# Patient Record
Sex: Male | Born: 1945 | Race: White | Hispanic: No | Smoking: Current every day smoker
Health system: Southern US, Community
[De-identification: ages and names within clinical notes are randomized; demographics above are authoritative.]

## PROBLEM LIST (undated history)

## (undated) DIAGNOSIS — T7840XA Allergy, unspecified, initial encounter: Secondary | ICD-10-CM

## (undated) DIAGNOSIS — G4730 Sleep apnea, unspecified: Secondary | ICD-10-CM

## (undated) DIAGNOSIS — J449 Chronic obstructive pulmonary disease, unspecified: Secondary | ICD-10-CM

## (undated) DIAGNOSIS — L988 Other specified disorders of the skin and subcutaneous tissue: Secondary | ICD-10-CM

## (undated) DIAGNOSIS — I2510 Atherosclerotic heart disease of native coronary artery without angina pectoris: Secondary | ICD-10-CM

## (undated) DIAGNOSIS — G4733 Obstructive sleep apnea (adult) (pediatric): Secondary | ICD-10-CM

## (undated) DIAGNOSIS — R0789 Other chest pain: Secondary | ICD-10-CM

## (undated) DIAGNOSIS — F32A Depression, unspecified: Secondary | ICD-10-CM

## (undated) DIAGNOSIS — I10 Essential (primary) hypertension: Secondary | ICD-10-CM

## (undated) DIAGNOSIS — E119 Type 2 diabetes mellitus without complications: Secondary | ICD-10-CM

## (undated) DIAGNOSIS — E785 Hyperlipidemia, unspecified: Secondary | ICD-10-CM

## (undated) DIAGNOSIS — R809 Proteinuria, unspecified: Secondary | ICD-10-CM

## (undated) DIAGNOSIS — I700 Atherosclerosis of aorta: Secondary | ICD-10-CM

## (undated) DIAGNOSIS — D126 Benign neoplasm of colon, unspecified: Secondary | ICD-10-CM

## (undated) DIAGNOSIS — F329 Major depressive disorder, single episode, unspecified: Secondary | ICD-10-CM

## (undated) DIAGNOSIS — N400 Enlarged prostate without lower urinary tract symptoms: Secondary | ICD-10-CM

## (undated) HISTORY — DX: Major depressive disorder, single episode, unspecified: F32.9

## (undated) HISTORY — DX: Depression, unspecified: F32.A

## (undated) HISTORY — DX: Essential (primary) hypertension: I10

## (undated) HISTORY — PX: OTHER SURGICAL HISTORY: SHX169

## (undated) HISTORY — DX: Type 2 diabetes mellitus without complications: E11.9

## (undated) HISTORY — DX: Hyperlipidemia, unspecified: E78.5

## (undated) HISTORY — DX: Chronic obstructive pulmonary disease, unspecified: J44.9

## (undated) HISTORY — DX: Allergy, unspecified, initial encounter: T78.40XA

---
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
## 1986-05-06 DIAGNOSIS — C439 Malignant melanoma of skin, unspecified: Secondary | ICD-10-CM

## 1986-05-06 HISTORY — DX: Malignant melanoma of skin, unspecified: C43.9

## 1996-05-06 HISTORY — PX: MELANOMA EXCISION: SHX5266

## 1999-05-07 HISTORY — PX: CHOLECYSTECTOMY: SHX55

## 2005-05-24 ENCOUNTER — Ambulatory Visit: Payer: Self-pay | Admitting: Unknown Physician Specialty

## 2006-05-20 ENCOUNTER — Ambulatory Visit: Payer: Self-pay | Admitting: Unknown Physician Specialty

## 2007-12-23 DIAGNOSIS — D239 Other benign neoplasm of skin, unspecified: Secondary | ICD-10-CM

## 2007-12-23 HISTORY — DX: Other benign neoplasm of skin, unspecified: D23.9

## 2007-12-25 ENCOUNTER — Ambulatory Visit: Payer: Self-pay | Admitting: Dermatology

## 2012-03-31 ENCOUNTER — Observation Stay: Payer: Self-pay | Admitting: Specialist

## 2012-03-31 LAB — COMPREHENSIVE METABOLIC PANEL
Albumin: 3.4 g/dL (ref 3.4–5.0)
Bilirubin,Total: 0.6 mg/dL (ref 0.2–1.0)
Chloride: 110 mmol/L — ABNORMAL HIGH (ref 98–107)
Creatinine: 0.81 mg/dL (ref 0.60–1.30)
EGFR (African American): >60
Glucose: 109 mg/dL — ABNORMAL HIGH (ref 65–99)
Osmolality: 282 (ref 275–301)
Potassium: 4 mmol/L (ref 3.5–5.1)
SGOT(AST): 23 U/L (ref 15–37)
SGPT (ALT): 26 U/L (ref 12–78)
Sodium: 140 mmol/L (ref 136–145)
Total Protein: 5.9 g/dL — ABNORMAL LOW (ref 6.4–8.2)

## 2012-03-31 LAB — CBC
HGB: 14.2 g/dL (ref 13.0–18.0)
MCH: 33.3 pg (ref 26.0–34.0)
MCHC: 35.9 g/dL (ref 32.0–36.0)
Platelet: 205 10*3/uL (ref 150–440)
RDW: 13.1 % (ref 11.5–14.5)
WBC: 8 10*3/uL (ref 3.8–10.6)

## 2012-03-31 LAB — HEMOGLOBIN
HGB: 13.9 g/dL (ref 13.0–18.0)
HGB: 12.9 g/dL — ABNORMAL LOW (ref 13.0–18.0)

## 2012-03-31 LAB — PROTIME-INR: Prothrombin Time: 13.1 secs (ref 11.5–14.7)

## 2012-04-01 LAB — CBC WITH DIFFERENTIAL/PLATELET
Basophil #: 0.1 10*3/uL (ref 0.0–0.1)
Eosinophil %: 3.3 %
HCT: 36.1 % — ABNORMAL LOW (ref 40.0–52.0)
HGB: 12.5 g/dL — ABNORMAL LOW (ref 13.0–18.0)
Lymphocyte #: 2.3 10*3/uL (ref 1.0–3.6)
MCH: 32.6 pg (ref 26.0–34.0)
MCHC: 34.6 g/dL (ref 32.0–36.0)
MCV: 94 fL (ref 80–100)
Neutrophil #: 3.9 10*3/uL (ref 1.4–6.5)
Neutrophil %: 54.9 %
Platelet: 184 10*3/uL (ref 150–440)
RBC: 3.83 10*6/uL — ABNORMAL LOW (ref 4.40–5.90)
RDW: 13.3 % (ref 11.5–14.5)
WBC: 7.1 10*3/uL (ref 3.8–10.6)

## 2012-04-01 LAB — BASIC METABOLIC PANEL
Anion Gap: 5 — ABNORMAL LOW (ref 7–16)
BUN: 11 mg/dL (ref 7–18)
Chloride: 113 mmol/L — ABNORMAL HIGH (ref 98–107)
Co2: 24 mmol/L (ref 21–32)
Creatinine: 0.81 mg/dL (ref 0.60–1.30)
Osmolality: 283 (ref 275–301)
Potassium: 4.1 mmol/L (ref 3.5–5.1)
Sodium: 142 mmol/L (ref 136–145)

## 2012-09-25 ENCOUNTER — Ambulatory Visit: Payer: Self-pay | Admitting: Unknown Physician Specialty

## 2012-09-29 LAB — HM COLONOSCOPY

## 2012-09-30 LAB — PATHOLOGY REPORT

## 2014-08-23 NOTE — Consult Note (Signed)
PATIENT NAME:  David [name], David [name] MR#:  [id number] DATE OF BIRTH:  1945/08/22  DATE OF CONSULTATION:  03/31/2012  REFERRING PHYSICIAN:  Vivek J. Verdell Carmine, MD  CONSULTING PHYSICIAN:  Manya Silvas, MD PRIMARY CARE PHYSICIAN: Jerrell Belfast, MD   REASON FOR CONSULTATION:  The patient is a 69 year old white male who is admitted with lower GI bleeding, started last night about 10:00 p.m. He woke up in the morning and had three episodes. He came to the hospital and was admitted. I was asked to see him in consultation.   The patient's previous colonoscopy in 2007 showed multiple diverticula, and small polyps were removed. Internal hemorrhoids were also found.    REVIEW OF SYSTEMS: No fever. No nausea or vomiting. No crampy abdominal pain. No dysuria or hematuria. No asthma, wheezing, emphysema, chronic cough, shortness of breath. No exertional chest pains.   PAST MEDICAL HISTORY:  1. Benign prostatic hypertrophy.  2. Hyperlipidemia.  3. Hypertension.   ALLERGIES: No known drug allergies.   HABITS: Smokes about 3/4 a pack a day. No alcohol intake.   SOCIAL HISTORY: The patient works as a Dance movement psychotherapist for [employer]: His 25 year old mother is alive. She has had uterine cancer. Father died from a heart attack.   CURRENT MEDICATIONS:  1. Simvastatin 20 mg a day.  2. Fish oil 500 mg a day.  3. Flomax 0.4 mg a day.  4. Lisinopril 2.5 mg a day.  5. Meloxicam 7.5 mg daily.  PHYSICAL EXAMINATION:  GENERAL: White male in no acute distress.   VITAL SIGNS: Temperature 97.3, pulse 72, blood pressure 130/72, pulse oximetry 97%.   GENERAL: White male in no acute distress.   HEENT: Sclerae anicteric. Conjunctivae negative. Head is atraumatic.    CHEST: Clear.   HEART: No murmurs, gallops, clicks, or rubs.   ABDOMEN: Soft, nontender. No hepatosplenomegaly. No masses. No bruits.   LABORATORY, DIAGNOSTIC AND RADIOLOGICAL DATA: Glucose 109, BUN 18, creatinine 0.8, sodium  140, potassium 4, chloride 110, CO2 25, calcium 8.5, total protein 5.9, albumin 3.4, total bilirubin 0.6, alkaline phosphatase 64, SGOT 23, SGPT 26. Troponin less than 0.02. Hemoglobin 14.2, repeat this afternoon 13.9. White count 8000, platelet count 2500. A+ blood with negative antibody screen. Pro time is 13.0. A nuclear medicine study showed increasing accumulation of activity inferior to the bladder in the region of the genitalia which can be a normal appearance.  The activity increases throughout the study, possibility of an inguinal hernia extending into the scrotum or possibly urinary incontinence.   ASSESSMENT: Lower GI bleeding, atypical GI bleeding scan.   PLAN: Plan to follow along with serial hemoglobins and a clear liquid diet.   ____________________________ Manya Silvas, MD rte:cbb D: 03/31/2012 17:58:22 ET T: 03/31/2012 18:28:33 ET JOB#: [id number]  cc: Manya Silvas, MD, <Dictator> Jerrell Belfast, MD Manya Silvas MD ELECTRONICALLY SIGNED 04/18/2012 11:47

## 2014-08-23 NOTE — H&P (Signed)
PATIENT NAME:  David [name], David MR#:  [name] DATE OF BIRTH:  [birth year]  DATE OF ADMISSION:  03/31/2012  PRIMARY CARE PHYSICIAN: Dr. Margarita Rana  CHIEF COMPLAINT: Bright red blood per rectum.   HISTORY OF PRESENT ILLNESS: This is a 69 year old male who presents to the Emergency Room after 4 to 5 episodes of bright red blood per rectum. Patient says that he had his first episode at 10:00 last night prior to going to bed. Overnight he had none and then woke up this morning and had three episodes, two at work. They have increased to the amount of blood with more clots in the last two episodes. He has never had these symptoms before. He had a colonoscopy done in 2007 which showed internal hemorrhoids and diverticulosis and polyps which were benign. Patient presently denies any abdominal pain, any fever, chills, nausea, vomiting, chest pain, shortness of breath, or any other associated symptoms.   REVIEW OF SYSTEMS: CONSTITUTIONAL: No documented fever. No weight gain. No weight loss. EYES: No blurry or double vision. ENT: No tinnitus. No postnasal drip. No redness of the oropharynx. RESPIRATORY: No cough, no wheeze, no hemoptysis, no dyspnea. CARDIOVASCULAR: No chest pain, no orthopnea, no palpitations, no syncope. GASTROINTESTINAL: No nausea, no vomiting, no diarrhea, no abdominal pain. Positive hematochezia. No melena. GENITOURINARY: No dysuria, no hematuria. ENDOCRINE: No polyuria or nocturia. No heat or cold intolerance. HEME: No anemia, no bruising. Positive acute bleeding. INTEGUMENTARY: No rashes. No lesions. MUSCULOSKELETAL: No arthritis, no swelling, no gout. NEUROLOGIC: No numbness, no tingling, no ataxia, no seizure-type activity. PSYCH: No anxiety, no insomnia, no ADD.   PAST MEDICAL HISTORY:  1. Hypertension.  2. Hyperlipidemia.  3. Benign prostatic hypertrophy.  4. Arthritis.   ALLERGIES: No known drug allergies.   SOCIAL HISTORY: Does smoke about 3/4 pack per day, has been smoking  for the past 40+ years. No alcohol abuse. No illicit drug abuse. Lives at home with his wife.   FAMILY HISTORY: Mother is alive. She has uterine cancer. Father died from a ventricular fibrillation arrest.   CURRENT MEDICATIONS:  1. Simvastatin 20 mg daily.  2. Fish oil 500 mg daily.  3. Flomax 0.4 mg daily.  4. Lisinopril 2.5 mg daily. 5. Meloxicam 7.5 mg daily.   PHYSICAL EXAMINATION:  VITAL SIGNS: Patient's vital signs are noted to be: Temperature 98.1, pulse 62, respirations 20, blood pressure 110/62, 97% on room air.   GENERAL: He is a pleasant appearing male in no apparent distress.   HEENT: Atraumatic, normocephalic. Extraocular muscles are intact. Pupils equal, reactive to light. Sclerae anicteric. No conjunctival injection. No pharyngeal erythema.   NECK: Supple. There is no jugular venous distention, no bruits, no lymphadenopathy, no thyromegaly.   HEART: Regular rate and rhythm. No murmurs, no rubs, no clicks.   LUNGS: Clear to auscultation bilaterally. No rales, no rhonchi, no wheezes.   ABDOMEN: Soft, flat, nontender, nondistended. Has good bowel sounds. No hepatosplenomegaly appreciated.   EXTREMITIES: No evidence of any cyanosis, clubbing, or peripheral edema. Has +2 pedal and radial pulses bilaterally.   NEUROLOGICAL: Patient is alert, awake, oriented x3. No focal motor or sensory deficits appreciated bilaterally.   SKIN: Moist, warm with no rash appreciated.   LYMPHATIC: There is no cervical or axillary lymphadenopathy.   LABORATORY, DIAGNOSTIC AND RADIOLOGICAL DATA: Serum glucose 109, BUN 18, creatinine 0.8, sodium 140, potassium 4.0, chloride 110, bicarbonate 25. LFTs are within normal limits. Troponin less than 0.02. White cell count 8, hemoglobin 14.2, hematocrit 39.7, platelet  count 205.   ASSESSMENT AND PLAN: This is a 69 year old male with history of hypertension, osteoarthritis, hyperlipidemia, benign prostatic hypertrophy presents to the hospital with 4 to  5 episodes of bright red blood per rectum.  1. Acute lower GI bleed. Patient presently is hemodynamically stable. His hemoglobin is stable although he has had multiple episodes of hematochezia. I will follow serial hemoglobins q.6h. I will get a stat GI bleeding scan. Also get a GI consult. Discussed the case with Dr. Vira Agar who will see the patient. Place him on a clear liquid diet. Transfuse him as needed. Patient is not on any anticoagulants presently. This is likely a diverticular bleed as he had a colonoscopy in 2007 which showed diverticulosis with internal hemorrhoids.  2. Hypertension. I will hold his lisinopril for now given his GI bleed and follow his hemodynamics.  3. Hyperlipidemia. Continue simvastatin.  4. Benign prostatic hypertrophy: Continue Flomax. 5. Osteoarthritis. Hold his meloxicam given his GI bleed.  6. CODE STATUS: Patient is a FULL CODE.   TIME SPENT WITH ADMISSION: 45 minutes.   ____________________________ Belia Heman. Verdell Carmine, MD vjs:cms D: 03/31/2012 12:02:07 ET T: 03/31/2012 12:14:04 ET JOB#: [id number]  cc: Belia Heman. Verdell Carmine, MD, <Dictator> Jerrell Belfast, MD Henreitta Leber MD ELECTRONICALLY SIGNED 04/03/2012 14:58

## 2014-08-23 NOTE — Discharge Summary (Signed)
PATIENT NAME:  David [name], David [name] MR#:  [id number] DATE OF BIRTH:  03/25/1946  DATE OF ADMISSION:  03/31/2012 DATE OF DISCHARGE:  04/01/2012  For a detailed note, please take a look at the history and physical done on admission.   DIAGNOSES AT DISCHARGE:  1. Acute lower GI bleed likely secondary to diverticulosis. 2. Hypertension. 3. Benign prostatic hypertrophy. 4. Hyperlipidemia. 5. Arthritis.   DIET: The patient is being discharged on a clear liquid diet for one day to be advanced to a full liquid diet tomorrow. It is okay to eat mashed potatoes and Cream of Wheat. The patient can advance his diet to a low fiber diet two days from today and advised not to eat any nuts. The patient should also be on a low fat diet.   ACTIVITY: As tolerated.   FOLLOW-UP: Follow-up with Dr. Vira Agar in the next 3 to 4 weeks.   DISCHARGE MEDICATIONS:   1. Simvastatin 20 mg daily.  2. Lisinopril 2.5 mg daily.  3. Flomax 0.4 mg daily. 4. Fish Oil 500 mg daily.  5. Meloxicam 7.5 mg daily.   [place] COURSE: Dr. Gaylyn Cheers from Gastroenterology    [place]: This is a 69 year old male with medical problems as mentioned above who presented to the hospital with multiple episodes of bright red blood per rectum.  1. Acute lower GI bleed. This is likely the cause of the patient's multiple episodes of bright red blood per rectum. The patient underwent a bleeding scan in the hospital which did not show any acute foci of bleeding. The patient has had no further episodes of bright red blood per rectum since yesterday. His hemoglobin has remained stable. The patient was seen in consultation by GI by Dr. Vira Agar who thinks this is most likely a diverticular bleed. The patient likely will need a colonoscopy in the near future hopefully in the next 3 to 4 weeks. The patient for now will continue clear liquid diet and advance to full liquids within a day or so and eventually to a low fiber  diet as mentioned above.  2. Hypertension. The patient remained hemodynamically stable. He will resume his home meds as mentioned including lisinopril.  3. Hyperlipidemia. The patient was maintained on his simvastatin. He will resume that.  4. Benign prostatic hypertrophy. The patient was maintained on his Flomax. He will resume that.  5. Osteoarthritis. The patient is on meloxicam. He will resume that too.   CODE STATUS: The patient is a FULL CODE.   TIME SPENT: 40 minutes.   ____________________________ Belia Heman. Verdell Carmine, MD vjs:drc D: 04/01/2012 14:35:12 ET T: 04/02/2012 12:02:31 ET JOB#: [id number]  cc: Belia Heman. Verdell Carmine, MD, <Dictator> Manya Silvas, MD Henreitta Leber MD ELECTRONICALLY SIGNED 04/03/2012 14:58

## 2014-09-29 ENCOUNTER — Ambulatory Visit: Payer: Managed Care, Other (non HMO)

## 2014-10-18 ENCOUNTER — Ambulatory Visit: Payer: Managed Care, Other (non HMO)

## 2014-10-18 ENCOUNTER — Telehealth: Payer: Self-pay | Admitting: Family Medicine

## 2014-10-18 NOTE — Telephone Encounter (Signed)
Pt stated that the order for his CPAP machine that we faxed about 3 weeks ago needs to be fax to another company per pt's insurance. Pt request that we fax the order to: Fax# [facility] in [place]. Thanks TNP

## 2014-10-18 NOTE — Telephone Encounter (Signed)
Do you know how to do this. Thanks-  Dr. Venia Minks

## 2014-10-21 ENCOUNTER — Ambulatory Visit (INDEPENDENT_AMBULATORY_CARE_PROVIDER_SITE_OTHER): Payer: Medicare Other | Admitting: Family Medicine

## 2014-10-21 ENCOUNTER — Encounter: Payer: Self-pay | Admitting: Family Medicine

## 2014-10-21 VITALS — BP 106/60 | HR 72 | Temp 98.5°F | Resp 16 | Ht 67.0 in | Wt 227.0 lb

## 2014-10-21 DIAGNOSIS — E1129 Type 2 diabetes mellitus with other diabetic kidney complication: Secondary | ICD-10-CM | POA: Insufficient documentation

## 2014-10-21 DIAGNOSIS — G4733 Obstructive sleep apnea (adult) (pediatric): Secondary | ICD-10-CM | POA: Insufficient documentation

## 2014-10-21 DIAGNOSIS — F329 Major depressive disorder, single episode, unspecified: Secondary | ICD-10-CM | POA: Insufficient documentation

## 2014-10-21 DIAGNOSIS — F17200 Nicotine dependence, unspecified, uncomplicated: Secondary | ICD-10-CM | POA: Insufficient documentation

## 2014-10-21 DIAGNOSIS — R809 Proteinuria, unspecified: Secondary | ICD-10-CM | POA: Insufficient documentation

## 2014-10-21 DIAGNOSIS — M545 Low back pain, unspecified: Secondary | ICD-10-CM | POA: Insufficient documentation

## 2014-10-21 DIAGNOSIS — G4730 Sleep apnea, unspecified: Secondary | ICD-10-CM | POA: Insufficient documentation

## 2014-10-21 DIAGNOSIS — J449 Chronic obstructive pulmonary disease, unspecified: Secondary | ICD-10-CM | POA: Insufficient documentation

## 2014-10-21 DIAGNOSIS — R202 Paresthesia of skin: Secondary | ICD-10-CM | POA: Insufficient documentation

## 2014-10-21 DIAGNOSIS — F32A Depression, unspecified: Secondary | ICD-10-CM | POA: Insufficient documentation

## 2014-10-21 DIAGNOSIS — E1169 Type 2 diabetes mellitus with other specified complication: Secondary | ICD-10-CM | POA: Insufficient documentation

## 2014-10-21 DIAGNOSIS — C439 Malignant melanoma of skin, unspecified: Secondary | ICD-10-CM | POA: Insufficient documentation

## 2014-10-21 DIAGNOSIS — E7800 Pure hypercholesterolemia, unspecified: Secondary | ICD-10-CM | POA: Insufficient documentation

## 2014-10-21 DIAGNOSIS — J309 Allergic rhinitis, unspecified: Secondary | ICD-10-CM | POA: Insufficient documentation

## 2014-10-21 DIAGNOSIS — N400 Enlarged prostate without lower urinary tract symptoms: Secondary | ICD-10-CM | POA: Insufficient documentation

## 2014-10-21 DIAGNOSIS — E669 Obesity, unspecified: Secondary | ICD-10-CM

## 2014-10-21 DIAGNOSIS — N529 Male erectile dysfunction, unspecified: Secondary | ICD-10-CM | POA: Insufficient documentation

## 2014-10-21 DIAGNOSIS — IMO0002 Reserved for concepts with insufficient information to code with codable children: Secondary | ICD-10-CM | POA: Insufficient documentation

## 2014-10-21 NOTE — Telephone Encounter (Signed)
Pt advised he would need office visit to complete sleep study

## 2014-10-21 NOTE — Progress Notes (Signed)
Subjective:    Patient ID: David Mutter., male    DOB: May 23, 1945, 69 y.o.   MRN: [id number]  HPI Sleep Apnea: Patient presents with known obstructive sleep apnea. Patent has history of symptoms of morning fatigue, falling asleep and snoring. . Patient generally gets 6 hours of sleep per night currently on his machine  and states they generally have generally restful sleep. Machine is over 66 years old.  Snoring of any severity is not present when he uses his CPAP machine. Marland Kitchen Apneic episodes are not present. Nasal obstruction is not present.  Patient has had tonsillectomy. Patient reports no snoring, or apnea with CPAP machine. Patient reports that he is trying to get a new machine. Patient reports that the company is requesting a new sleep study. Has been on CPAP machine with good success since 1998. Was falling asleep, daytime fatigue, loud snoring. All of this is controlled with CPAP. Needs an new machine.   [facility] went out of business.  Needs a new, recent sleep study in order to obtain new machine. Patient has been very compliant with his CPAP over the years as he feels so much better.      Review of Systems  Constitutional: Negative.   HENT: Positive for postnasal drip.   Eyes: Negative.   Respiratory: Positive for cough.   Cardiovascular: Negative.   Psychiatric/Behavioral: Negative.    Patient Active Problem List   Diagnosis Date Noted  . 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 Clinical depression 10/21/2014  . Diabetes mellitus type 2 in obese 10/21/2014  . 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 Hypercholesteremia 10/21/2014  . LBP (low back pain) 10/21/2014  . 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 Microalbuminuria 10/21/2014  . Adult BMI 30+ 10/21/2014  . Burning or prickling sensation 10/21/2014  . Allergic rhinitis 10/21/2014  . Apnea, sleep 10/21/2014  . 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 Allergy    No current outpatient prescriptions on file prior to visit.   No current facility-administered medications on file prior to visit.   Allergies  Allergen Reactions  . Alcohol     Addiction   Past Surgical History  Procedure Laterality Date  . Cholecystectomy  2001  . Melanoma excision  1998  . Tonsillectomy and adenoidectomy  1953   History   Social History  . Marital Status: Married    Spouse Name: N/A  . Number of Children: N/A  . Years of Education: College   Occupational History  .  [employer]    Full-Time   Social History Main Topics  . Smoking status: Former Smoker -- 0.25 packs/day for 52 years    Types: Cigarettes    Quit date: 11/03/2013  . Smokeless tobacco: Never Used  . Alcohol Use: No     Comment: Previous Heavy Alcohol Abuse  . Drug Use: No  . Sexual Activity: Not on file   Other Topics Concern  . Not on file   Social History Narrative   Family History  Problem Relation Age of Onset  . Cervical cancer Mother   . Colon cancer Mother   . Colon polyps Brother   . Cervical cancer Maternal Aunt   . Cervical cancer Maternal Grandmother   . Diabetes Maternal Grandmother   . Tuberculosis Maternal Grandmother   . Lung cancer Maternal Grandfather        Objective:   Physical Exam  Constitutional: He is oriented to person, place, and time. He appears well-developed and well-nourished.  Cardiovascular: Normal rate and regular rhythm.   Pulmonary/Chest: Effort normal and breath sounds normal.  Neurological: He is alert and oriented to person, place, and time.  Psychiatric: He has a normal mood and affect.    Blood pressure 106/60, pulse 72, temperature 98.5 F (36.9 C), temperature source Oral, resp. rate 16, height 5\' 7"  (1.702 m), weight 227 lb (102.967 kg), SpO2 96 %.       Assessment & Plan:   1. Apnea, sleep Patient has sleep apnea.  Needs new sleep study in order get a new machine. His machine is over 32  years old. Sleep study from 18 byears ago is not available. Will order new study as patient really needs new machine to treat his sleep apnea for his physical and mental health.   - Ambulatory referral to Sleep Studies

## 2014-11-08 ENCOUNTER — Telehealth: Payer: Self-pay | Admitting: Family Medicine

## 2014-11-08 NOTE — Telephone Encounter (Signed)
David [name] from [facility] states that information that was faxed in did not meet criteria for sleep study to be met.She will send it to medical director for review.Phone [phone]

## 2014-11-08 NOTE — Telephone Encounter (Signed)
Do I need to call them?  Thanks.

## 2014-11-09 NOTE — Telephone Encounter (Signed)
No,If it is denied by medical director then you will get fax or another phone call letting you know this.Message was just an [place]

## 2014-11-10 ENCOUNTER — Encounter: Payer: Self-pay | Admitting: Family Medicine

## 2014-11-11 ENCOUNTER — Encounter: Payer: Self-pay | Admitting: Family Medicine

## 2014-11-11 NOTE — Telephone Encounter (Signed)
[employer] call regarding a sleep study for Mr. Buch.  The call back is [phone].  Thanks, TP

## 2014-11-14 NOTE — Telephone Encounter (Signed)
Referral form for home sleep study sent back to Dr Venia Minks to sign

## 2014-11-14 NOTE — Telephone Encounter (Signed)
Need form for Home study. Thanks.

## 2014-12-12 ENCOUNTER — Telehealth: Payer: Self-pay | Admitting: Family Medicine

## 2014-12-12 DIAGNOSIS — G4730 Sleep apnea, unspecified: Secondary | ICD-10-CM

## 2014-12-12 NOTE — Telephone Encounter (Signed)
Information faxed to [facility] pt (Phone [phone]) at request of pt for Auto CPAP.Their office will contact pt

## 2014-12-12 NOTE — Telephone Encounter (Signed)
Please schedule auto-titration secondary to sleep apnea on sleep study.  Thanks.

## 2014-12-16 ENCOUNTER — Encounter: Payer: Self-pay | Admitting: Family Medicine

## 2015-01-18 ENCOUNTER — Encounter: Payer: Self-pay | Admitting: Family Medicine

## 2015-01-18 ENCOUNTER — Ambulatory Visit (INDEPENDENT_AMBULATORY_CARE_PROVIDER_SITE_OTHER): Payer: Managed Care, Other (non HMO) | Admitting: Family Medicine

## 2015-01-18 VITALS — BP 104/62 | HR 68 | Temp 98.2°F | Resp 16 | Ht 67.0 in | Wt 219.0 lb

## 2015-01-18 DIAGNOSIS — E669 Obesity, unspecified: Secondary | ICD-10-CM

## 2015-01-18 DIAGNOSIS — J449 Chronic obstructive pulmonary disease, unspecified: Secondary | ICD-10-CM | POA: Diagnosis not present

## 2015-01-18 DIAGNOSIS — E7800 Pure hypercholesterolemia, unspecified: Secondary | ICD-10-CM

## 2015-01-18 DIAGNOSIS — E1169 Type 2 diabetes mellitus with other specified complication: Secondary | ICD-10-CM

## 2015-01-18 DIAGNOSIS — E119 Type 2 diabetes mellitus without complications: Secondary | ICD-10-CM | POA: Diagnosis not present

## 2015-01-18 DIAGNOSIS — G4730 Sleep apnea, unspecified: Secondary | ICD-10-CM

## 2015-01-18 DIAGNOSIS — F17200 Nicotine dependence, unspecified, uncomplicated: Secondary | ICD-10-CM | POA: Diagnosis not present

## 2015-01-18 DIAGNOSIS — IMO0002 Reserved for concepts with insufficient information to code with codable children: Secondary | ICD-10-CM

## 2015-01-18 DIAGNOSIS — E668 Other obesity: Secondary | ICD-10-CM

## 2015-01-18 LAB — POCT GLYCOSYLATED HEMOGLOBIN (HGB A1C)
Est. average glucose Bld gHb Est-mCnc: 131
Hemoglobin A1C: 6.2

## 2015-01-18 NOTE — Progress Notes (Signed)
Subjective:    Patient ID: David Mutter., male    DOB: [birth year], 69 y.o.   MRN: [id number]  Diabetes He presents for his follow-up (Last A1C  09/09/2014 7.6%) diabetic visit. There are no hypoglycemic associated symptoms. Pertinent negatives for diabetes include no blurred vision, no chest pain, no fatigue, no foot paresthesias, no foot ulcerations, no polydipsia, no polyphagia, no polyuria, no visual change, no weakness and no weight loss. Current diabetic treatment includes oral agent (monotherapy). He is compliant with treatment all of the time. (BS not being checked) Eye exam is not current (1.5 years. [facility]).  Hyperlipidemia This is a chronic problem. Lipid results: 11/03/2013 Total- 179, Trig- 182, HDL- 34, LDL- 109. Pertinent negatives include no chest pain, focal sensory loss, focal weakness, leg pain, myalgias or shortness of breath. Current antihyperlipidemic treatment includes statins (Atorvastatin 20 mg).   Also, does have sleep apnea.  Using machine. CPAP machine is really old. Does need a new one. Still waiting on CPAP titration.     Review of Systems  Constitutional: Negative for fever, chills, weight loss, diaphoresis, activity change, appetite change, fatigue and unexpected weight change.  Eyes: Negative for blurred vision.  Respiratory: Negative for shortness of breath.   Cardiovascular: Negative for chest pain.  Endocrine: Negative for polydipsia, polyphagia and polyuria.  Musculoskeletal: Negative for myalgias.  Neurological: Negative for focal weakness and weakness.   BP 104/62 mmHg  Pulse 68  Temp(Src) 98.2 F (36.8 C) (Oral)  Resp 16  Ht 5\' 7"  (1.702 m)  Wt 219 lb (99.338 kg)  BMI 34.29 kg/m2   Patient Active Problem List   Diagnosis Date Noted  . 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 Clinical depression 10/21/2014  . Diabetes mellitus type 2 in obese 10/21/2014  . 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  Hypercholesteremia 10/21/2014  . LBP (low back pain) 10/21/2014  . 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 Microalbuminuria 10/21/2014  . Adult BMI 30+ 10/21/2014  . Burning or prickling sensation 10/21/2014  . Allergic rhinitis 10/21/2014  . Apnea, sleep 10/21/2014  . 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 Allergy   . COPD (chronic obstructive pulmonary disease)   . Diabetes mellitus without complication   . Hyperlipidemia   . Depression    Current Outpatient Prescriptions on File Prior to Visit  Medication Sig  . atorvastatin (LIPITOR) 20 MG tablet Take by mouth.  . Cholecalciferol (VITAMIN D) 2000 UNITS CAPS Take by mouth.  Marland Kitchen lisinopril (PRINIVIL,ZESTRIL) 5 MG tablet Take by mouth.  . metFORMIN (GLUCOPHAGE) 500 MG tablet Take by mouth.  . Omega 3 1000 MG CAPS Take by mouth.  . sildenafil (VIAGRA) 50 MG tablet Take by mouth.  . tamsulosin (FLOMAX) 0.4 MG CAPS capsule Take by mouth.  . diphenhydrAMINE (BENADRYL) 25 mg capsule Take 25 mg by mouth every 6 (six) hours as needed.  . meloxicam (MOBIC) 15 MG tablet Take by mouth.   No current facility-administered medications on file prior to visit.   Allergies  Allergen Reactions  . Alcohol     Addiction   Past Surgical History  Procedure Laterality Date  . Cholecystectomy  2001  . Melanoma excision  1998  . Tonsillectomy and adenoidectomy  1953   Social History   Social History  . Marital Status: Married    Spouse Name: N/A  . Number of Children: N/A  . Years of Education:  College   Occupational History  .  [employer]    Full-Time   Social History Main Topics  . Smoking status: Current Every Day Smoker -- 1.00 packs/day for 52 years    Types: Cigarettes    Last Attempt to Quit: 11/03/2013  . Smokeless tobacco: Never Used     Comment: Pt started smoking again  . Alcohol Use: No     Comment: Previous Heavy Alcohol Abuse  . Drug Use: No  . Sexual Activity: Not on  file   Other Topics Concern  . Not on file   Social History Narrative   Family History  Problem Relation Age of Onset  . Cervical cancer Mother   . Colon cancer Mother   . Colon polyps Brother   . Cervical cancer Maternal Aunt   . Cervical cancer Maternal Grandmother   . Diabetes Maternal Grandmother   . Tuberculosis Maternal Grandmother   . Lung cancer Maternal Grandfather        Objective:   Physical Exam  Constitutional: He is oriented to person, place, and time. He appears well-developed and well-nourished.  Cardiovascular: Normal rate and regular rhythm.   Pulmonary/Chest: Effort normal and breath sounds normal.  Neurological: He is alert and oriented to person, place, and time.   BP 104/62 mmHg  Pulse 68  Temp(Src) 98.2 F (36.8 C) (Oral)  Resp 16  Ht 5\' 7"  (1.702 m)  Wt 219 lb (99.338 kg)  BMI 34.29 kg/m2     Assessment & Plan:  1. Diabetes mellitus type 2 in obese Stable.  Improved. Will continue current medication,  And recheck in 3 months.  - POCT glycosylated hemoglobin (Hb A1C) - CBC with Differential/Platelet - Comprehensive metabolic panel Results for orders placed or performed in visit on 01/18/15  POCT glycosylated hemoglobin (Hb A1C)  Result Value Ref Range   Hemoglobin A1C 6.2    Est. average glucose Bld gHb Est-mCnc 131     2. Adult BMI 30+ Has lost 11 pounds.   3. Hypercholesteremia Needs labs checked.  - Lipid panel  4. Compulsive tobacco user syndrome Spirometry is normal today.  Still recommend work on tobacco cessation.   - Spirometry with Graph  5. Apnea, sleep Awaiting CPAP titration.  - TSH  6. COPD, mild As above.  - Spirometry with Graph  Margarita Rana, MD

## 2015-01-20 LAB — CBC WITH DIFFERENTIAL/PLATELET
Basophils Absolute: 0 10*3/uL (ref 0.0–0.2)
Basos: 0 %
EOS (ABSOLUTE): 0.3 10*3/uL (ref 0.0–0.4)
EOS: 3 %
HEMATOCRIT: 41.7 % (ref 37.5–51.0)
Hemoglobin: 14.1 g/dL (ref 12.6–17.7)
IMMATURE GRANULOCYTES: 1 %
Immature Grans (Abs): 0.1 10*3/uL (ref 0.0–0.1)
LYMPHS: 30 %
Lymphocytes Absolute: 2.5 10*3/uL (ref 0.7–3.1)
MCH: 30.3 pg (ref 26.6–33.0)
MCHC: 33.8 g/dL (ref 31.5–35.7)
MCV: 90 fL (ref 79–97)
MONOCYTES: 11 %
Monocytes Absolute: 0.9 10*3/uL (ref 0.1–0.9)
NEUTROS PCT: 55 %
Neutrophils Absolute: 4.6 10*3/uL (ref 1.4–7.0)
Platelets: 245 10*3/uL (ref 150–379)
RBC: 4.65 x10E6/uL (ref 4.14–5.80)
RDW: 13.9 % (ref 12.3–15.4)
WBC: 8.3 10*3/uL (ref 3.4–10.8)

## 2015-01-20 LAB — COMPREHENSIVE METABOLIC PANEL
A/G RATIO: 2 (ref 1.1–2.5)
ALBUMIN: 4.3 g/dL (ref 3.6–4.8)
ALK PHOS: 96 IU/L (ref 39–117)
ALT: 23 IU/L (ref 0–44)
AST: 16 IU/L (ref 0–40)
BUN / CREAT RATIO: 12 (ref 10–22)
BUN: 13 mg/dL (ref 8–27)
Bilirubin Total: 0.4 mg/dL (ref 0.0–1.2)
CHLORIDE: 103 mmol/L (ref 97–108)
CO2: 20 mmol/L (ref 18–29)
Calcium: 9.2 mg/dL (ref 8.6–10.2)
Creatinine, Ser: 1.06 mg/dL (ref 0.76–1.27)
GFR calc Af Amer: 82 mL/min/{1.73_m2} (ref >59)
GFR calc non Af Amer: 71 mL/min/{1.73_m2} (ref >59)
GLUCOSE: 105 mg/dL — AB (ref 65–99)
Globulin, Total: 2.2 g/dL (ref 1.5–4.5)
POTASSIUM: 4.9 mmol/L (ref 3.5–5.2)
Sodium: 143 mmol/L (ref 134–144)
Total Protein: 6.5 g/dL (ref 6.0–8.5)

## 2015-01-20 LAB — LIPID PANEL
Chol/HDL Ratio: 4.7 ratio units (ref 0.0–5.0)
Cholesterol, Total: 151 mg/dL (ref 100–199)
HDL: 32 mg/dL — ABNORMAL LOW (ref >39)
LDL Calculated: 66 mg/dL (ref 0–99)
Triglycerides: 265 mg/dL — ABNORMAL HIGH (ref 0–149)
VLDL CHOLESTEROL CAL: 53 mg/dL — AB (ref 5–40)

## 2015-01-20 LAB — TSH: TSH: 3.94 u[IU]/mL (ref 0.450–4.500)

## 2015-05-24 ENCOUNTER — Ambulatory Visit (INDEPENDENT_AMBULATORY_CARE_PROVIDER_SITE_OTHER): Payer: Medicare Other | Admitting: Family Medicine

## 2015-05-24 ENCOUNTER — Encounter: Payer: Self-pay | Admitting: Family Medicine

## 2015-05-24 DIAGNOSIS — G4730 Sleep apnea, unspecified: Secondary | ICD-10-CM

## 2015-05-24 DIAGNOSIS — Z0000 Encounter for general adult medical examination without abnormal findings: Secondary | ICD-10-CM

## 2015-05-24 NOTE — Progress Notes (Signed)
Patient ID: David [name]., male   DOB: 26-Jun-1945, 70 y.o.   MRN: [id number]       Patient: David [name]., Male    DOB: March 26, 1946, 70 y.o.   MRN: [id number] Visit Date: 05/24/2015  Today's Provider: Margarita Rana, MD   Chief Complaint  Patient presents with  . Medicare Wellness   Subjective:    Annual wellness visit David Gennusa. is a 70 y.o. male. He feels well. He reports exercising active with daily activites. He reports he is sleeping well. 12/20/10 CPE 09/25/12 Colon-polyps, int hemorrhoids, diverticulosis, recheck in 5 yrs  Lab Results  Component Value Date   WBC 8.3 01/19/2015   HGB 12.5* 04/01/2012   HCT 41.7 01/19/2015   PLT 245 01/19/2015   GLUCOSE 105* 01/19/2015   CHOL 151 01/19/2015   TRIG 265* 01/19/2015   HDL 32* 01/19/2015   LDLCALC 66 01/19/2015   ALT 23 01/19/2015   AST 16 01/19/2015   NA 143 01/19/2015   K 4.9 01/19/2015   CL 103 01/19/2015   CREATININE 1.06 01/19/2015   BUN 13 01/19/2015   CO2 20 01/19/2015   TSH 3.940 01/19/2015   INR 1.0 03/31/2012   HGBA1C 6.2 01/18/2015   -----------------------------------------------------------   Review of Systems  Constitutional: Negative.   HENT: Negative.   Eyes: Negative.   Respiratory: Positive for apnea.   Cardiovascular: Negative.   Gastrointestinal: Negative.   Endocrine: Negative.   Genitourinary: Negative.   Musculoskeletal: Negative.   Skin: Negative.   Allergic/Immunologic: Negative.   Neurological: Negative.   Hematological: Negative.   Psychiatric/Behavioral: Negative.     Social History   Social History  . Marital Status: Married    Spouse Name: N/A  . Number of Children: N/A  . Years of Education: College   Occupational History  .  [employer]    retired   Social History Main Topics  . Smoking status: Current Every Day Smoker -- 1.00 packs/day for 52 years    Types: Cigarettes    Last Attempt to Quit: 11/03/2013  . Smokeless tobacco: Never Used   Comment: Pt started smoking again  . Alcohol Use: No     Comment: Previous Heavy Alcohol Abuse  . Drug Use: No  . Sexual Activity: Not on file   Other Topics Concern  . Not on file   Social History Narrative    Past Medical History  Diagnosis Date  . Allergy   . COPD (chronic obstructive pulmonary disease) ([place])   . Diabetes mellitus without complication ([place])   . Hyperlipidemia   . Depression      Patient Active Problem List   Diagnosis Date Noted  . 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 COPD, mild ([place]) 10/21/2014  . Clinical depression 10/21/2014  . Diabetes mellitus type 2 in obese ([place]) 10/21/2014  . 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 Hypercholesteremia 10/21/2014  . LBP (low back pain) 10/21/2014  . 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 Microalbuminuria 10/21/2014  . Adult BMI 30+ 10/21/2014  . Burning or prickling sensation 10/21/2014  . Allergic rhinitis 10/21/2014  . Apnea, sleep 10/21/2014  . Compulsive tobacco user syndrome 10/21/2014    Past Surgical History  Procedure Laterality Date  . Cholecystectomy  2001  . Melanoma excision  1998  . Tonsillectomy and adenoidectomy  1953    His family history includes Cervical cancer in his maternal aunt, maternal grandmother, and mother; Colon cancer in his mother; Colon polyps in  his brother; Diabetes in his maternal grandmother; Lung cancer in his maternal grandfather; Tuberculosis in his maternal grandmother.    Previous Medications   ATORVASTATIN (LIPITOR) 20 MG TABLET    Take 20 mg by mouth daily at 6 PM.    CHOLECALCIFEROL (VITAMIN D) 2000 UNITS CAPS    Take 1 capsule by mouth daily.    DIPHENHYDRAMINE (BENADRYL) 25 MG CAPSULE    Take 25 mg by mouth every 6 (six) hours as needed.   LISINOPRIL (PRINIVIL,ZESTRIL) 5 MG TABLET    Take 5 mg by mouth daily.    LORATADINE (CLARITIN) 10 MG TABLET    Take 10 mg by mouth daily as needed.    MELOXICAM (MOBIC) 15 MG TABLET    Take 15 mg by mouth as  needed.    METFORMIN (GLUCOPHAGE) 500 MG TABLET    Take 500 mg by mouth daily with breakfast.    OMEGA 3 1000 MG CAPS    Take 1 capsule by mouth daily.    SILDENAFIL (VIAGRA) 50 MG TABLET    Take 50 mg by mouth as needed.    TAMSULOSIN (FLOMAX) 0.4 MG CAPS CAPSULE    Take 0.4 mg by mouth daily.     Patient Care Team: Margarita Rana, MD as PCP - General (Family Medicine)     Objective:   Vitals: BP 108/66 mmHg  Pulse 65  Temp(Src) 98.3 F (36.8 C) (Oral)  Resp 16  Ht 5\' 7"  (1.702 m)  Wt 222 lb (100.699 kg)  BMI 34.76 kg/m2  SpO2 98%  Physical Exam  Constitutional: He is oriented to person, place, and time. He appears well-developed and well-nourished.  HENT:  Head: Normocephalic and atraumatic.  Right Ear: External ear normal.  Left Ear: External ear normal.  Nose: Nose normal.  Mouth/Throat: Oropharynx is clear and moist.  Eyes: Conjunctivae and EOM are normal. Pupils are equal, round, and reactive to light.  Neck: Normal range of motion. Neck supple.  Cardiovascular: Normal rate, regular rhythm and normal heart sounds.   Pulmonary/Chest: Effort normal and breath sounds normal.  Abdominal: Soft. Bowel sounds are normal.  Musculoskeletal: Normal range of motion.  Neurological: He is alert and oriented to person, place, and time.  Skin: Skin is warm and dry.  Psychiatric: He has a normal mood and affect. His behavior is normal. Judgment and thought content normal.    Activities of Daily Living In your present state of health, do you have any difficulty performing the following activities: 05/24/2015 10/21/2014  Hearing? N N  Vision? N N  Difficulty concentrating or making decisions? N N  Walking or climbing stairs? N N  Dressing or bathing? N N  Doing errands, shopping? N N    Fall Risk Assessment Fall Risk  05/24/2015 10/21/2014  Falls in the past year? No No     Depression Screen PHQ 2/9 Scores 05/24/2015 10/21/2014  PHQ - 2 Score 0 0    Cognitive Testing -  6-CIT  Correct? Score   What year is it? yes 0 0 or 4  What month is it? yes 0 0 or 3  Memorize:    Pia Mau,  41,  [place],      What time is it? (within 1 hour) yes 0 0 or 3  Count backwards from 20 yes 0 0, 2, or 4  Name the months of the year yes 0 0, 2, or 4  Repeat name & address above yes 1 0,  2, 4, 6, 8, or 10       TOTAL SCORE  1/28   Interpretation:  Normal  Normal (0-7) Abnormal (8-28)       Assessment & Plan:     Annual Wellness Visit  Reviewed patient's Family Medical History Reviewed and updated list of patient's medical providers Assessment of cognitive impairment was done Assessed patient's functional ability Established a written schedule for health screening Bernice Completed and Reviewed  Exercise Activities and Dietary recommendations Goals    None      Immunization History  Administered Date(s) Administered  . Influenza-Unspecified 03/07/2015  . Pneumococcal Conjugate-13 06/08/2014  . Pneumococcal Polysaccharide-23 12/16/2011  . Td 09/11/2004  . Zoster 02/27/2012      1. Initial Medicare annual wellness visit Stable. Patient advised to continue eating healthy and exercise daily. Patient retired from [employer] 2 weeks ago. Patient advised to follow-up in 2 months to F/U in diabetes. - EKG 12-Lead   2. Apnea, sleep Wrote rx for auto-titrating CPAP and gave copy of study.      Patient seen and examined by Dr. Jerrell Belfast, and note scribed by Philbert Riser. Dimas, CMA.  I have reviewed the document for accuracy and completeness and I agree with above. Jerrell Belfast, MD   Margarita Rana, MD   ------------------------------------------------------------------------------------------------------------

## 2015-06-08 ENCOUNTER — Encounter: Payer: Self-pay | Admitting: Family Medicine

## 2015-06-08 NOTE — Telephone Encounter (Signed)
Please check on this. See message below.  Did we send the documentation needed. Thanks.

## 2015-07-06 ENCOUNTER — Ambulatory Visit (INDEPENDENT_AMBULATORY_CARE_PROVIDER_SITE_OTHER): Payer: Medicare Other | Admitting: Family Medicine

## 2015-07-06 ENCOUNTER — Encounter: Payer: Self-pay | Admitting: Family Medicine

## 2015-07-06 VITALS — BP 138/64 | HR 68 | Temp 98.0°F | Resp 16 | Wt 231.0 lb

## 2015-07-06 DIAGNOSIS — G4730 Sleep apnea, unspecified: Secondary | ICD-10-CM | POA: Diagnosis not present

## 2015-07-06 NOTE — Progress Notes (Signed)
Patient ID: David [name]., male   DOB: May 24, 1945, 70 y.o.   MRN: [id number]        Patient: David [name]. Male    DOB: 12-25-1945   70 y.o.   MRN: [id number] Visit Date: 07/06/2015  Today's Provider: Margarita Rana, MD   Chief Complaint  Patient presents with  . Sleep Apnea   Subjective:    HPI   Sleep Apnea: He presents for a sleep evaluation. He reports is Cpap is over 49 years old and is need of a new one.  He is compliant with using it every night and reports good symptom control.  Symptoms began several years ago, controlled since that time.   He goes to sleep at 11:30 weekdays and 11:30 weekends. He awakens 0800 weekdays and 0800 weekends. Puts on mask when first gets in bed and does not take if off until am, approximately 8 hours a night.  Is 100 percent compliant in that he can not sleep without it. Takes machine when travels, even camping. He falls asleep in 10 minutes.  Feels rested in the am. Good symptom control. Usually wakes before his alarm. Feeling good since retirement.     BP has up a little recently.  Feels well. Taking medication. Will continue to monitor. No chest pain or SOB. Increase activity since retirement.      Allergies  Allergen Reactions  . Alcohol     Addiction   Previous Medications   ATORVASTATIN (LIPITOR) 20 MG TABLET    Take 20 mg by mouth daily at 6 PM.    CHOLECALCIFEROL (VITAMIN D) 2000 UNITS CAPS    Take 1 capsule by mouth daily.    DIPHENHYDRAMINE (BENADRYL) 25 MG CAPSULE    Take 25 mg by mouth every 6 (six) hours as needed.   LISINOPRIL (PRINIVIL,ZESTRIL) 5 MG TABLET    Take 5 mg by mouth daily.    LORATADINE (CLARITIN) 10 MG TABLET    Take 10 mg by mouth daily as needed.    MELOXICAM (MOBIC) 15 MG TABLET    Take 15 mg by mouth as needed.    METFORMIN (GLUCOPHAGE) 500 MG TABLET    Take 500 mg by mouth daily with breakfast.    OMEGA 3 1000 MG CAPS    Take 1 capsule by mouth daily.    SILDENAFIL (VIAGRA) 50 MG TABLET    Take 50 mg by  mouth as needed.    TAMSULOSIN (FLOMAX) 0.4 MG CAPS CAPSULE    Take 0.4 mg by mouth daily.     Review of Systems  Constitutional: Negative for fever, chills, diaphoresis, activity change, appetite change, fatigue and unexpected weight change.  Respiratory: Positive for apnea. Negative for cough, shortness of breath and wheezing.   Cardiovascular: Negative for chest pain, palpitations and leg swelling.    Social History  Substance Use Topics  . Smoking status: Current Every Day Smoker -- 1.00 packs/day for 52 years    Types: Cigarettes    Last Attempt to Quit: 11/03/2013  . Smokeless tobacco: Never Used     Comment: Pt started smoking again  . Alcohol Use: No     Comment: Previous Heavy Alcohol Abuse   Objective:   BP 138/64 mmHg  Pulse 68  Temp(Src) 98 F (36.7 C) (Oral)  Resp 16  Wt 231 lb (104.781 kg)  SpO2 98%  Physical Exam  Constitutional: He is oriented to person, place, and time. He appears well-developed and well-nourished.  Cardiovascular:  Normal rate and regular rhythm.   Pulmonary/Chest: Effort normal and breath sounds normal.  Neurological: He is alert and oriented to person, place, and time.  Psychiatric: He has a normal mood and affect. His behavior is normal. Judgment and thought content normal.      Assessment & Plan:     1. Apnea, sleep Stable. Very compliant with treatment. Does help. Needs new machine.  Continue using CPAP nightly.  Filled out new paperwork.    Margarita Rana, MD        Margarita Rana, MD  [facility]

## 2015-07-24 ENCOUNTER — Ambulatory Visit (INDEPENDENT_AMBULATORY_CARE_PROVIDER_SITE_OTHER): Payer: Medicare Other | Admitting: Family Medicine

## 2015-07-24 ENCOUNTER — Encounter: Payer: Self-pay | Admitting: Family Medicine

## 2015-07-24 VITALS — BP 118/62 | HR 80 | Temp 97.8°F | Resp 16 | Wt 224.0 lb

## 2015-07-24 DIAGNOSIS — E669 Obesity, unspecified: Secondary | ICD-10-CM | POA: Diagnosis not present

## 2015-07-24 DIAGNOSIS — E1169 Type 2 diabetes mellitus with other specified complication: Secondary | ICD-10-CM

## 2015-07-24 DIAGNOSIS — E119 Type 2 diabetes mellitus without complications: Secondary | ICD-10-CM

## 2015-07-24 LAB — POCT UA - MICROALBUMIN: MICROALBUMIN (UR) POC: 20 mg/L

## 2015-07-24 LAB — POCT GLYCOSYLATED HEMOGLOBIN (HGB A1C)
Est. average glucose Bld gHb Est-mCnc: 134
Hemoglobin A1C: 6.3

## 2015-07-24 NOTE — Progress Notes (Signed)
Subjective:    Patient ID: David Mutter., male    DOB: 09/08/1945, 70 y.o.   MRN: [id number]  Diabetes He presents for his follow-up (Last A1C was 01/18/2015 and was 6.2%) diabetic visit. He has type 2 diabetes mellitus. Hypoglycemia symptoms include hunger. (Every 1-2 months) Pertinent negatives for diabetes include no blurred vision, no chest pain, no fatigue, no foot paresthesias, no foot ulcerations, no polydipsia, no polyphagia, no polyuria, no visual change, no weakness and no weight loss. Symptoms are stable. Risk factors for coronary artery disease include diabetes mellitus, dyslipidemia, male sex, obesity and tobacco exposure. Current diabetic treatment includes oral agent (monotherapy) (Metformin 500 mg). He is compliant with treatment all of the time. He is following a generally healthy diet. Home blood sugar record trend: not being checked. An ACE inhibitor/angiotensin II receptor blocker is being taken. Eye exam is not current.      Review of Systems  Constitutional: Negative for weight loss and fatigue.  Eyes: Negative for blurred vision.  Cardiovascular: Negative for chest pain.  Endocrine: Negative for polydipsia, polyphagia and polyuria.  Neurological: Negative for weakness.   BP 118/62 mmHg  Pulse 80  Temp(Src) 97.8 F (36.6 C) (Oral)  Resp 16  Wt 224 lb (101.606 kg)   Patient Active Problem List   Diagnosis Date Noted  . 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 COPD, mild ([place]) 10/21/2014  . Clinical depression 10/21/2014  . Diabetes mellitus type 2 in obese ([place]) 10/21/2014  . 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 Hypercholesteremia 10/21/2014  . LBP (low back pain) 10/21/2014  . 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 Microalbuminuria 10/21/2014  . Adult BMI 30+ 10/21/2014  . Burning or prickling sensation 10/21/2014  . Allergic rhinitis 10/21/2014  . Apnea, sleep 10/21/2014  . 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 Allergy   . COPD (chronic obstructive pulmonary disease) ([place])   . Diabetes mellitus without complication ([place])   . Hyperlipidemia   . Depression    Current Outpatient Prescriptions on File Prior to Visit  Medication Sig  . atorvastatin (LIPITOR) 20 MG tablet Take 20 mg by mouth daily at 6 PM.   . Cholecalciferol (VITAMIN D) 2000 UNITS CAPS Take 1 capsule by mouth daily.   . diphenhydrAMINE (BENADRYL) 25 mg capsule Take 25 mg by mouth every 6 (six) hours as needed.  Marland Kitchen lisinopril (PRINIVIL,ZESTRIL) 5 MG tablet Take 5 mg by mouth daily.   Marland Kitchen loratadine (CLARITIN) 10 MG tablet Take 10 mg by mouth daily as needed.   . meloxicam (MOBIC) 15 MG tablet Take 15 mg by mouth as needed.   . metFORMIN (GLUCOPHAGE) 500 MG tablet Take 500 mg by mouth daily with breakfast.   . Omega 3 1000 MG CAPS Take 1 capsule by mouth daily.   . sildenafil (VIAGRA) 50 MG tablet Take 50 mg by mouth as needed.   . tamsulosin (FLOMAX) 0.4 MG CAPS capsule Take 0.4 mg by mouth daily.    No current facility-administered medications on file prior to visit.   Allergies  Allergen Reactions  . Alcohol     Addiction   Past Surgical History  Procedure Laterality Date  . Cholecystectomy  2001  . Melanoma excision  1998  . Tonsillectomy and adenoidectomy  1953   Social History   Social History  . Marital Status: Married    Spouse Name: N/A  . Number of Children: N/A  .  Years of Education: College   Occupational History  .  [employer]    retired   Social History Main Topics  . Smoking status: Current Every Day Smoker -- 1.00 packs/day for 52 years    Types: Cigarettes    Last Attempt to Quit: 11/03/2013  . Smokeless tobacco: Never Used     Comment: Pt started smoking again  . Alcohol Use: No     Comment: Previous Heavy Alcohol Abuse  . Drug Use: No  . Sexual Activity: Not on file   Other Topics Concern  . Not on file   Social History Narrative   Family History  Problem Relation Age  of Onset  . Cervical cancer Mother   . Colon cancer Mother   . Colon polyps Brother   . Cervical cancer Maternal Aunt   . Cervical cancer Maternal Grandmother   . Diabetes Maternal Grandmother   . Tuberculosis Maternal Grandmother   . Lung cancer Maternal Grandfather        Objective:   Physical Exam  Constitutional: He appears well-developed and well-nourished.  Cardiovascular: Normal rate and regular rhythm.   Pulmonary/Chest: Effort normal and breath sounds normal. No respiratory distress.  Psychiatric: He has a normal mood and affect. His behavior is normal.   BP 118/62 mmHg  Pulse 80  Temp(Src) 97.8 F (36.6 C) (Oral)  Resp 16  Wt 224 lb (101.606 kg)      Assessment & Plan:  1. Diabetes mellitus type 2 in obese (H[facility]) Stable. Continue current medication and plan of care. Pt needs diabetic eye exam. Referral placed as below.  Results for orders placed or performed in visit on 07/24/15  POCT glycosylated hemoglobin (Hb A1C)  Result Value Ref Range   Hemoglobin A1C 6.3    Est. average glucose Bld gHb Est-mCnc 134   POCT UA - Microalbumin  Result Value Ref Range   Microalbumin Ur, POC 20 mg/L    - POCT glycosylated hemoglobin (Hb A1C) - POCT UA - Microalbumin - Ambulatory referral to Ophthalmology   Patient seen and examined by Jerrell Belfast, MD, and note scribed by Renaldo Fiddler, CMA.  I have reviewed the document for accuracy and completeness and I agree with above. Jerrell Belfast, MD   Margarita Rana, MD

## 2015-07-25 ENCOUNTER — Other Ambulatory Visit: Payer: Self-pay | Admitting: Family Medicine

## 2015-07-25 MED ORDER — TAMSULOSIN HCL 0.4 MG PO CAPS: 0.4000 mg | ORAL_CAPSULE | Freq: Every day | ORAL | Status: DC

## 2015-07-25 MED ORDER — METFORMIN HCL 500 MG PO TABS: 500.0000 mg | ORAL_TABLET | Freq: Every day | ORAL | Status: DC

## 2015-07-25 MED ORDER — ATORVASTATIN CALCIUM 20 MG PO TABS: 20.0000 mg | ORAL_TABLET | Freq: Every day | ORAL | Status: DC

## 2015-07-25 MED ORDER — MELOXICAM 15 MG PO TABS: 15.0000 mg | ORAL_TABLET | ORAL | Status: DC | PRN

## 2015-07-25 MED ORDER — LISINOPRIL 5 MG PO TABS: 5.0000 mg | ORAL_TABLET | Freq: Every day | ORAL | Status: DC

## 2015-07-25 MED ORDER — SILDENAFIL CITRATE 50 MG PO TABS: 50.0000 mg | ORAL_TABLET | ORAL | Status: DC | PRN

## 2015-08-07 ENCOUNTER — Other Ambulatory Visit: Payer: Self-pay

## 2015-08-07 DIAGNOSIS — J309 Allergic rhinitis, unspecified: Secondary | ICD-10-CM

## 2015-08-07 MED ORDER — FLUTICASONE PROPIONATE 50 MCG/ACT NA SUSP: 2.0000 | Freq: Every day | NASAL | Status: DC

## 2015-08-16 ENCOUNTER — Encounter: Payer: Self-pay | Admitting: Family Medicine

## 2015-08-16 ENCOUNTER — Telehealth: Payer: Self-pay | Admitting: Family Medicine

## 2015-08-16 NOTE — Telephone Encounter (Signed)
Chris with [facility] agency called wanting the information from the last Sleep Study done on David [name] for the instructions on using the C pap machine .  His call back is  [phone]  Thanks Con Memos

## 2015-08-17 NOTE — Telephone Encounter (Signed)
Can we have them send sample of what they need. Thanks.

## 2015-08-17 NOTE — Telephone Encounter (Signed)
[facility] just needed to have a copy of the sleep study to get the recommendations. Done. ED

## 2015-08-17 NOTE — Telephone Encounter (Signed)
Please call Gerald Stabs and clarify and see if we have that information. Thanks.

## 2015-08-17 NOTE — Telephone Encounter (Signed)
Dr. Jerilynn Mages, It sounds like they are in need of an order with pressure settings?? Let me know what I can do to help. Jiles Garter

## 2015-08-28 ENCOUNTER — Other Ambulatory Visit: Payer: Self-pay | Admitting: Family Medicine

## 2015-08-28 DIAGNOSIS — J309 Allergic rhinitis, unspecified: Secondary | ICD-10-CM

## 2015-08-29 ENCOUNTER — Encounter: Payer: Self-pay | Admitting: Family Medicine

## 2015-08-29 MED ORDER — FLUTICASONE PROPIONATE 50 MCG/ACT NA SUSP: 2.0000 | Freq: Every day | NASAL | Status: DC

## 2015-09-02 ENCOUNTER — Other Ambulatory Visit: Payer: Self-pay | Admitting: Family Medicine

## 2015-09-02 DIAGNOSIS — J309 Allergic rhinitis, unspecified: Secondary | ICD-10-CM

## 2015-09-02 MED ORDER — FLUTICASONE PROPIONATE 50 MCG/ACT NA SUSP: 2.0000 | Freq: Every day | NASAL | Status: DC

## 2015-09-20 DIAGNOSIS — E119 Type 2 diabetes mellitus without complications: Secondary | ICD-10-CM | POA: Diagnosis not present

## 2015-09-20 LAB — HM DIABETES EYE EXAM

## 2015-09-21 ENCOUNTER — Other Ambulatory Visit: Payer: Self-pay

## 2015-09-29 ENCOUNTER — Other Ambulatory Visit: Payer: Self-pay

## 2015-09-29 DIAGNOSIS — N529 Male erectile dysfunction, unspecified: Secondary | ICD-10-CM

## 2015-09-29 MED ORDER — SILDENAFIL CITRATE 50 MG PO TABS: 50.0000 mg | ORAL_TABLET | ORAL | Status: DC | PRN

## 2015-10-18 ENCOUNTER — Telehealth: Payer: Self-pay | Admitting: Family Medicine

## 2015-10-18 DIAGNOSIS — N529 Male erectile dysfunction, unspecified: Secondary | ICD-10-CM

## 2015-10-18 MED ORDER — SILDENAFIL CITRATE 50 MG PO TABS: 50.0000 mg | ORAL_TABLET | ORAL | Status: DC | PRN

## 2015-10-18 NOTE — Telephone Encounter (Signed)
Will give rx. Thanks.

## 2015-12-06 ENCOUNTER — Ambulatory Visit (INDEPENDENT_AMBULATORY_CARE_PROVIDER_SITE_OTHER): Payer: Medicare Other | Admitting: Family Medicine

## 2015-12-06 ENCOUNTER — Encounter: Payer: Self-pay | Admitting: Family Medicine

## 2015-12-06 VITALS — BP 116/70 | HR 70 | Temp 98.1°F | Resp 16 | Wt 218.6 lb

## 2015-12-06 DIAGNOSIS — L723 Sebaceous cyst: Secondary | ICD-10-CM | POA: Diagnosis not present

## 2015-12-06 NOTE — Patient Instructions (Signed)
Discussed cleansing with soap and water and applying band-aid. 

## 2015-12-06 NOTE — Progress Notes (Signed)
Subjective:     Patient ID: David Mutter., male   DOB: 06/05/1945, 70 y.o.   MRN: [id number]  HPI  Chief Complaint  Patient presents with  . Skin Problem    Patient comes in office today with concerns of a possible boil or cyst on his lower back. Patient reports that he first noticed bump a month ago and has now grown in size and become more tender.   States he will be driving on a long trip tomorrow and it will be uncomfortable. Denies any drainage.   Review of Systems     Objective:   Physical Exam  Constitutional: He appears well-developed and well-nourished. No distress.  Skin:  Right lower back with mildly inflamed epidermoid cyst. Procedure note: Cleansed with alcohol and infiltrated lidocaine/epinephrine. I & D with moderate amount of cyst material, no pus observed. Dressing applied.       Assessment:    1. Inflamed epidermoid cyst of skin - INCISION AND DRAINAGE; Future    Plan:    Discussed cleansing with soap and water and applying dressing.

## 2016-01-01 DIAGNOSIS — L711 Rhinophyma: Secondary | ICD-10-CM | POA: Diagnosis not present

## 2016-01-01 DIAGNOSIS — L812 Freckles: Secondary | ICD-10-CM | POA: Diagnosis not present

## 2016-01-01 DIAGNOSIS — D229 Melanocytic nevi, unspecified: Secondary | ICD-10-CM | POA: Diagnosis not present

## 2016-01-01 DIAGNOSIS — L918 Other hypertrophic disorders of the skin: Secondary | ICD-10-CM | POA: Diagnosis not present

## 2016-01-01 DIAGNOSIS — D1800 Hemangioma unspecified site: Secondary | ICD-10-CM | POA: Diagnosis not present

## 2016-01-01 DIAGNOSIS — Z1283 Encounter for screening for malignant neoplasm of skin: Secondary | ICD-10-CM | POA: Diagnosis not present

## 2016-01-01 DIAGNOSIS — L578 Other skin changes due to chronic exposure to nonionizing radiation: Secondary | ICD-10-CM | POA: Diagnosis not present

## 2016-01-01 DIAGNOSIS — Z85820 Personal history of malignant melanoma of skin: Secondary | ICD-10-CM | POA: Diagnosis not present

## 2016-01-01 DIAGNOSIS — D485 Neoplasm of uncertain behavior of skin: Secondary | ICD-10-CM | POA: Diagnosis not present

## 2016-01-01 DIAGNOSIS — D179 Benign lipomatous neoplasm, unspecified: Secondary | ICD-10-CM | POA: Diagnosis not present

## 2016-01-01 DIAGNOSIS — L821 Other seborrheic keratosis: Secondary | ICD-10-CM | POA: Diagnosis not present

## 2016-02-01 ENCOUNTER — Ambulatory Visit: Payer: Medicare Other | Admitting: Family Medicine

## 2016-02-07 ENCOUNTER — Ambulatory Visit: Payer: Medicare Other | Admitting: Family Medicine

## 2016-02-19 ENCOUNTER — Encounter: Payer: Self-pay | Admitting: Family Medicine

## 2016-02-19 ENCOUNTER — Ambulatory Visit (INDEPENDENT_AMBULATORY_CARE_PROVIDER_SITE_OTHER): Payer: Medicare Other | Admitting: Family Medicine

## 2016-02-19 ENCOUNTER — Ambulatory Visit: Payer: Medicare Other | Admitting: Family Medicine

## 2016-02-19 VITALS — BP 124/62 | HR 74 | Temp 98.2°F | Resp 16 | Wt 222.6 lb

## 2016-02-19 DIAGNOSIS — Z23 Encounter for immunization: Secondary | ICD-10-CM

## 2016-02-19 DIAGNOSIS — E1169 Type 2 diabetes mellitus with other specified complication: Secondary | ICD-10-CM | POA: Diagnosis not present

## 2016-02-19 DIAGNOSIS — N529 Male erectile dysfunction, unspecified: Secondary | ICD-10-CM

## 2016-02-19 DIAGNOSIS — E7800 Pure hypercholesterolemia, unspecified: Secondary | ICD-10-CM

## 2016-02-19 DIAGNOSIS — E669 Obesity, unspecified: Secondary | ICD-10-CM | POA: Diagnosis not present

## 2016-02-19 MED ORDER — BLOOD GLUCOSE MONITOR KIT: PACK | 0 refills | Status: DC

## 2016-02-19 NOTE — Patient Instructions (Addendum)
We will call you with the lab results. Try for morning fasting sugar <140 and 2 hours after meal sugars of < 180.

## 2016-02-19 NOTE — Progress Notes (Signed)
Subjective:     Patient ID: David [name]., male   DOB: 03-07-1946, 70 y.o.   MRN: [id number]  HPI  Chief Complaint  Patient presents with  . Diabetes    Patient is present in office today for follow up, last office visit was 07/24/15. At last visit condition was stable and patient was advised to continue medication, eye exam referral was generated and patient reports he did go to appointment 5/17/1. Patient HgbA1C  at visit was 6.3%, patient reports good compliance and tolerance on medication.   Wishes flu shot today.   Review of Systems  Respiratory: Negative for shortness of breath.   Cardiovascular: Negative for chest pain and palpitations.       Objective:   Physical Exam  Constitutional: He appears well-developed and well-nourished. No distress.  Lungs: clear Heart: RRR without murmur Lower extremities: no edema; pedal pulses intact, sensation to monofilament intact, no wounds noted.    Assessment:    1. Diabetes mellitus type 2 in obese [facility]): faxed order for glucometer to his pharmacy - POCT glycosylated hemoglobin (Hb A1C) - Comprehensive metabolic panel  2. Need for influenza vaccination - Flu vaccine HIGH DOSE PF  3. Hypercholesteremia - Lipid panel  4. Failure of erection: samples of Viagra 50 mg. #2.    Plan:    Further f/u pending lab work.

## 2016-02-20 ENCOUNTER — Other Ambulatory Visit: Payer: Self-pay | Admitting: Family Medicine

## 2016-02-20 ENCOUNTER — Encounter: Payer: Self-pay | Admitting: Family Medicine

## 2016-02-20 DIAGNOSIS — E1169 Type 2 diabetes mellitus with other specified complication: Secondary | ICD-10-CM

## 2016-02-20 DIAGNOSIS — E669 Obesity, unspecified: Principal | ICD-10-CM

## 2016-02-20 LAB — POCT GLYCOSYLATED HEMOGLOBIN (HGB A1C): Hemoglobin A1C: 6.6

## 2016-02-20 MED ORDER — BLOOD GLUCOSE MONITOR KIT: PACK | 0 refills | Status: DC

## 2016-02-21 ENCOUNTER — Telehealth: Payer: Self-pay

## 2016-02-21 NOTE — Telephone Encounter (Signed)
Patient called back and said [facility] pharmacy needed additional information for glucometer.  Contacted [facility] per pharmacist Summer patient has Medicare B, and they need a written RX with diagnosis code on it faxed to them.

## 2016-02-22 ENCOUNTER — Other Ambulatory Visit: Payer: Self-pay | Admitting: Family Medicine

## 2016-02-22 DIAGNOSIS — E1169 Type 2 diabetes mellitus with other specified complication: Secondary | ICD-10-CM | POA: Diagnosis not present

## 2016-02-22 DIAGNOSIS — E669 Obesity, unspecified: Secondary | ICD-10-CM | POA: Diagnosis not present

## 2016-02-22 DIAGNOSIS — E7800 Pure hypercholesterolemia, unspecified: Secondary | ICD-10-CM | POA: Diagnosis not present

## 2016-02-22 NOTE — Telephone Encounter (Signed)
Provider faxed over information for meter and a copy of order has been left at front desk by provider.

## 2016-02-23 LAB — LIPID PANEL
Chol/HDL Ratio: 5.2 ratio units — ABNORMAL HIGH (ref 0.0–5.0)
Cholesterol, Total: 155 mg/dL (ref 100–199)
HDL: 30 mg/dL — AB (ref >39)
LDL CALC: 90 mg/dL (ref 0–99)
Triglycerides: 173 mg/dL — ABNORMAL HIGH (ref 0–149)
VLDL CHOLESTEROL CAL: 35 mg/dL (ref 5–40)

## 2016-02-23 LAB — COMPREHENSIVE METABOLIC PANEL
ALBUMIN: 4.1 g/dL (ref 3.5–4.8)
ALT: 22 IU/L (ref 0–44)
AST: 17 IU/L (ref 0–40)
Albumin/Globulin Ratio: 1.7 (ref 1.2–2.2)
Alkaline Phosphatase: 109 IU/L (ref 39–117)
BILIRUBIN TOTAL: 0.4 mg/dL (ref 0.0–1.2)
BUN / CREAT RATIO: 16 (ref 10–24)
BUN: 13 mg/dL (ref 8–27)
CALCIUM: 9.5 mg/dL (ref 8.6–10.2)
CHLORIDE: 103 mmol/L (ref 96–106)
CO2: 26 mmol/L (ref 18–29)
CREATININE: 0.8 mg/dL (ref 0.76–1.27)
GFR, EST AFRICAN AMERICAN: 104 mL/min/{1.73_m2} (ref >59)
GFR, EST NON AFRICAN AMERICAN: 90 mL/min/{1.73_m2} (ref >59)
Globulin, Total: 2.4 g/dL (ref 1.5–4.5)
Glucose: 153 mg/dL — ABNORMAL HIGH (ref 65–99)
Potassium: 5.2 mmol/L (ref 3.5–5.2)
Sodium: 143 mmol/L (ref 134–144)
TOTAL PROTEIN: 6.5 g/dL (ref 6.0–8.5)

## 2016-03-23 ENCOUNTER — Encounter: Payer: Self-pay | Admitting: Emergency Medicine

## 2016-03-23 ENCOUNTER — Emergency Department
Admission: EM | Admit: 2016-03-23 | Discharge: 2016-03-23 | Disposition: A | Discharge status: Home / Self Care | Payer: Medicare Other | Attending: Emergency Medicine | Admitting: Emergency Medicine

## 2016-03-23 DIAGNOSIS — Z7984 Long term (current) use of oral hypoglycemic drugs: Secondary | ICD-10-CM | POA: Diagnosis not present

## 2016-03-23 DIAGNOSIS — J449 Chronic obstructive pulmonary disease, unspecified: Secondary | ICD-10-CM | POA: Insufficient documentation

## 2016-03-23 DIAGNOSIS — E119 Type 2 diabetes mellitus without complications: Secondary | ICD-10-CM | POA: Diagnosis not present

## 2016-03-23 DIAGNOSIS — Y9389 Activity, other specified: Secondary | ICD-10-CM | POA: Diagnosis not present

## 2016-03-23 DIAGNOSIS — F17210 Nicotine dependence, cigarettes, uncomplicated: Secondary | ICD-10-CM | POA: Diagnosis not present

## 2016-03-23 DIAGNOSIS — Z79899 Other long term (current) drug therapy: Secondary | ICD-10-CM | POA: Diagnosis not present

## 2016-03-23 DIAGNOSIS — Y999 Unspecified external cause status: Secondary | ICD-10-CM | POA: Insufficient documentation

## 2016-03-23 DIAGNOSIS — W228XXA Striking against or struck by other objects, initial encounter: Secondary | ICD-10-CM | POA: Insufficient documentation

## 2016-03-23 DIAGNOSIS — Y92007 Garden or yard of unspecified non-institutional (private) residence as the place of occurrence of the external cause: Secondary | ICD-10-CM | POA: Insufficient documentation

## 2016-03-23 DIAGNOSIS — S0502XA Injury of conjunctiva and corneal abrasion without foreign body, left eye, initial encounter: Secondary | ICD-10-CM | POA: Insufficient documentation

## 2016-03-23 DIAGNOSIS — H578 Other specified disorders of eye and adnexa: Secondary | ICD-10-CM | POA: Diagnosis present

## 2016-03-23 MED ORDER — KETOROLAC TROMETHAMINE 0.5 % OP SOLN: 1.0000 [drp] | Freq: Four times a day (QID) | OPHTHALMIC | 0 refills | Status: DC

## 2016-03-23 MED ORDER — FLUORESCEIN SODIUM 1 MG OP STRP
1.0000 | ORAL_STRIP | Freq: Once | OPHTHALMIC | Status: DC
  Filled 2016-03-23: qty 1

## 2016-03-23 MED ORDER — TETRACAINE HCL 0.5 % OP SOLN
2.0000 [drp] | Freq: Once | OPHTHALMIC | Status: DC
  Filled 2016-03-23: qty 2

## 2016-03-23 MED ORDER — POLYMYXIN B-TRIMETHOPRIM 10000-0.1 UNIT/ML-% OP SOLN: 2.0000 [drp] | Freq: Four times a day (QID) | OPHTHALMIC | 0 refills | Status: DC

## 2016-03-23 NOTE — ED Notes (Signed)
Pt verbalized understanding of discharge instructions. NAD at this time. 

## 2016-03-23 NOTE — ED Provider Notes (Signed)
[facility] Emergency Department Provider Note  ____________________________________________  Time seen: Approximately 3:14 PM  I have reviewed the triage vital signs and the nursing notes.   HISTORY  Chief Complaint Eye Problem    HPI David [name]. is a 70 y.o. male who presents emergency department complaining of left eye injury. Patient states that he was at home trimming some shrubbery when he bent down and accidentally poked himself in the left eye. Patient states that there is a foreign body sensation to the left eye. He reports some mild blurriness but no definitive changes in vision. Patient does not wear glasses or contacts. No medications prior to arrival.   Past Medical History:  Diagnosis Date  . Allergy   . COPD (chronic obstructive pulmonary disease) ([place])   . Depression   . Diabetes mellitus without complication ([place])   . Hyperlipidemia     Patient Active Problem List   Diagnosis Date Noted  . 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 COPD, mild ([place]) 10/21/2014  . Clinical depression 10/21/2014  . Diabetes mellitus type 2 in obese ([place]) 10/21/2014  . 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 Hypercholesteremia 10/21/2014  . LBP (low back pain) 10/21/2014  . 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 Microalbuminuria 10/21/2014  . Adult BMI 30+ 10/21/2014  . Burning or prickling sensation 10/21/2014  . Allergic rhinitis 10/21/2014  . Apnea, sleep 10/21/2014  . Compulsive tobacco user syndrome 10/21/2014    Past Surgical History:  Procedure Laterality Date  . CHOLECYSTECTOMY  2001  . MELANOMA EXCISION  1998  . TONSILLECTOMY AND ADENOIDECTOMY  1953    Prior to Admission medications   Medication Sig Start Date End Date Taking? Authorizing Provider  atorvastatin (LIPITOR) 20 MG tablet Take 1 tablet (20 mg total) by mouth daily at 6 PM. 07/25/15   Margarita Rana, MD  blood glucose meter kit and supplies KIT Dispense based on  patient and insurance preference. Use up to twice daily to twice weekly as directed. 02/20/16   Carmon Ginsberg, PA  Cholecalciferol (VITAMIN D) 2000 UNITS CAPS Take 1 capsule by mouth daily.  06/20/10   Historical Provider, MD  diphenhydrAMINE (BENADRYL) 25 mg capsule Take 25 mg by mouth every 6 (six) hours as needed.    Historical Provider, MD  fluticasone (FLONASE) 50 MCG/ACT nasal spray Place 2 sprays into both nostrils daily. 09/02/15   Margarita Rana, MD  ketorolac (ACULAR) 0.5 % ophthalmic solution Place 1 drop into the right eye 4 (four) times daily. 03/23/16   Charline Bills Renly Roots, PA-C  lisinopril (PRINIVIL,ZESTRIL) 5 MG tablet Take 1 tablet (5 mg total) by mouth daily. 07/25/15   Margarita Rana, MD  loratadine (CLARITIN) 10 MG tablet Take 10 mg by mouth daily as needed.     Historical Provider, MD  meloxicam (MOBIC) 15 MG tablet Take 1 tablet (15 mg total) by mouth as needed. 07/25/15   Margarita Rana, MD  metFORMIN (GLUCOPHAGE) 500 MG tablet Take 1 tablet (500 mg total) by mouth daily with breakfast. 07/25/15   Margarita Rana, MD  Omega 3 1000 MG CAPS Take 1 capsule by mouth daily.  06/20/10   Historical Provider, MD  sildenafil (VIAGRA) 50 MG tablet Take 1 tablet (50 mg total) by mouth as needed. 10/18/15   Margarita Rana, MD  tamsulosin (FLOMAX) 0.4 MG CAPS capsule Take 1 capsule (0.4 mg total) by mouth daily. 07/25/15   Margarita Rana, MD  trimethoprim-polymyxin b (POLYTRIM) ophthalmic solution Place 2 drops  into the left eye every 6 (six) hours. 03/23/16   Charline Bills Bram Hottel, PA-C    Allergies Alcohol  Family History  Problem Relation Age of Onset  . Cervical cancer Mother   . Colon cancer Mother   . Colon polyps Brother   . Cervical cancer Maternal Aunt   . Cervical cancer Maternal Grandmother   . Diabetes Maternal Grandmother   . Tuberculosis Maternal Grandmother   . Lung cancer Maternal Grandfather     Social History Social History  Substance Use Topics  . Smoking status:  Current Every Day Smoker    Packs/day: 1.00    Years: 52.00    Types: Cigarettes    Last attempt to quit: 11/03/2013  . Smokeless tobacco: Never Used     Comment: Pt started smoking again  . Alcohol use No     Comment: Previous Heavy Alcohol Abuse     Review of Systems  Constitutional: No fever/chills Eyes: Endorses foreign body sensation to the left eye. Mild left sided blurriness. No discharge ENT: No upper respiratory complaints. Cardiovascular: no chest pain. Respiratory: no cough. No SOB. Musculoskeletal: Negative for musculoskeletal pain. Skin: Negative for rash, abrasions, lacerations, ecchymosis. Neurological: Negative for headaches, focal weakness or numbness. 10-point ROS otherwise negative.  ____________________________________________   PHYSICAL EXAM:  VITAL SIGNS: ED Triage Vitals  Enc Vitals Group     BP 03/23/16 1457 (!) 145/57     Pulse Rate 03/23/16 1457 68     Resp 03/23/16 1457 18     Temp 03/23/16 1457 97.8 F (36.6 C)     Temp Source 03/23/16 1457 Oral     SpO2 03/23/16 1457 97 %     Weight 03/23/16 1457 206 lb (93.4 kg)     Height 03/23/16 1457 '5\' 7"'  (1.702 m)     Head Circumference --      Peak Flow --      Pain Score 03/23/16 1458 4     Pain Loc --      Pain Edu? --      Excl. in [name]? --      Constitutional: Alert and oriented. Well appearing and in no acute distress. Eyes: Conjunctivae are normal. PERRL. EOMI.Funduscopic exam is unremarkable bilaterally with no visible foreign body, red reflex bilaterally, no visualization of hyphema, optic disc and vasculature unremarkable. Patient's eye was anesthetized using tetracaine drops. Fluorescein staining reveals significant area of uptake over the pupil extending towards the 12:00 position. No foreign body. Head: Atraumatic. ENT:      Ears:       Nose: No congestion/rhinnorhea.      Mouth/Throat: Mucous membranes are moist.  Neck: No stridor.    Cardiovascular: Normal rate, regular rhythm.  Normal S1 and S2.  Good peripheral circulation. Respiratory: Normal respiratory effort without tachypnea or retractions. Lungs CTAB. Good air entry to the bases with no decreased or absent breath sounds. Musculoskeletal: Full range of motion to all extremities. No gross deformities appreciated. Neurologic:  Normal speech and language. No gross focal neurologic deficits are appreciated.  Skin:  Skin is warm, dry and intact. No rash noted. Psychiatric: Mood and affect are normal. Speech and behavior are normal. Patient exhibits appropriate insight and judgement.   ____________________________________________   LABS (all labs ordered are listed, but only abnormal results are displayed)  Labs Reviewed - No data to display ____________________________________________  EKG   ____________________________________________  RADIOLOGY   No results found.  ____________________________________________    PROCEDURES  Procedure(s) performed:  Procedures    Medications  fluorescein ophthalmic strip 1 strip (not administered)  tetracaine (PONTOCAINE) 0.5 % ophthalmic solution 2 drop (not administered)     ____________________________________________   INITIAL IMPRESSION / ASSESSMENT AND PLAN / ED COURSE  Pertinent labs & imaging results that were available during my care of the patient were reviewed by me and considered in my medical decision making (see chart for details).  Review of the [place] CSRS was performed in accordance of the [place] prior to dispensing any controlled drugs.  Clinical Course     Patient's diagnosis is consistent with Corneal abrasions of the left eye. Exam is reassuring with good ocular motion intact. No evidence of deep penetrating trauma. Globe appears to be intact with no signs of hyphema.. Patient will be discharged home with prescriptions for antibiotic eyedrops for prophylactic coverage and Acular. Patient is to follow up with ophthalmology as  needed or otherwise directed. Patient is given ED precautions to return to the ED for any worsening or new symptoms.     ____________________________________________  FINAL CLINICAL IMPRESSION(S) / ED DIAGNOSES  Final diagnoses:  Abrasion of left cornea, initial encounter      NEW MEDICATIONS STARTED DURING THIS VISIT:  Discharge Medication List as of 03/23/2016  3:49 PM    START taking these medications   Details  ketorolac (ACULAR) 0.5 % ophthalmic solution Place 1 drop into the right eye 4 (four) times daily., Starting Sat 03/23/2016, Print    trimethoprim-polymyxin b (POLYTRIM) ophthalmic solution Place 2 drops into the left eye every 6 (six) hours., Starting Sat 03/23/2016, Print            This chart was dictated using voice recognition software/Dragon. Despite best efforts to proofread, errors can occur which can change the meaning. Any change was purely unintentional.    Darletta Moll, PA-C 03/23/16 [place], MD 04/02/16 [phone]

## 2016-03-23 NOTE — ED Triage Notes (Signed)
Pt c/o irritation to L eye. Pt states he was doing yard work, bent over to pick up a garden hose and poked himself in the eye with a twig. States he feels like something is still in eye.

## 2016-03-25 DIAGNOSIS — S0502XA Injury of conjunctiva and corneal abrasion without foreign body, left eye, initial encounter: Secondary | ICD-10-CM | POA: Diagnosis not present

## 2016-03-27 DIAGNOSIS — S0502XD Injury of conjunctiva and corneal abrasion without foreign body, left eye, subsequent encounter: Secondary | ICD-10-CM | POA: Diagnosis not present

## 2016-04-04 DIAGNOSIS — S0502XD Injury of conjunctiva and corneal abrasion without foreign body, left eye, subsequent encounter: Secondary | ICD-10-CM | POA: Diagnosis not present

## 2016-05-21 ENCOUNTER — Ambulatory Visit (INDEPENDENT_AMBULATORY_CARE_PROVIDER_SITE_OTHER): Payer: Medicare Other | Admitting: Family Medicine

## 2016-05-21 ENCOUNTER — Encounter: Payer: Self-pay | Admitting: Family Medicine

## 2016-05-21 VITALS — BP 114/60 | HR 73 | Temp 97.7°F | Resp 16 | Wt 226.6 lb

## 2016-05-21 DIAGNOSIS — E669 Obesity, unspecified: Secondary | ICD-10-CM

## 2016-05-21 DIAGNOSIS — E1169 Type 2 diabetes mellitus with other specified complication: Secondary | ICD-10-CM

## 2016-05-21 LAB — POCT GLYCOSYLATED HEMOGLOBIN (HGB A1C): Hemoglobin A1C: 6.7

## 2016-05-21 MED ORDER — METFORMIN HCL 500 MG PO TABS: 500.0000 mg | ORAL_TABLET | Freq: Two times a day (BID) | ORAL | 3 refills | Status: DC

## 2016-05-21 NOTE — Progress Notes (Signed)
Subjective:     Patient ID: David Mutter., male   DOB: 1945/12/31, 71 y.o.   MRN: [id number]  HPI  Chief Complaint  Patient presents with  . Diabetes    Patient comes in office today for his 3 month follow up, patients last office visit was 02/19/16. At last visit patient in house HgbA1C was at 6.6%. Patient reports fair compliance on medication he states that he was only taking dose as 1/2 tablet but has not increased back to a whole tablet due to blood sugar. Average fasting blood sugar range is 130-160, patient has been inspecting feet wounds and sores and denies any hyperglycemia incidents.   States he has resumed metformin twice daily with meals. Small bump up in in A1C today. Primary exercise is walking his dog.   Review of Systems  Respiratory: Negative for shortness of breath.   Cardiovascular: Negative for chest pain and palpitations.       Objective:   Physical Exam  Constitutional: He appears well-developed and well-nourished. No distress.  Cardiovascular: Normal rate and regular rhythm.   Pulmonary/Chest: Breath sounds normal.  Musculoskeletal: He exhibits no edema (of lower extremities).       Assessment:    1. Diabetes mellitus type 2 in obese (HCC) - POCT glycosylated hemoglobin (Hb A1C) - metFORMIN (GLUCOPHAGE) 500 MG tablet; Take 1 tablet (500 mg total) by mouth 2 (two) times daily with a meal.  Dispense: 180 tablet; Refill: 3    Plan:    Encouraged 30 minutes of regular exercise.

## 2016-05-21 NOTE — Patient Instructions (Signed)
Encourage walking 30 minutes daily. Continue metformin twice daily with meals.

## 2016-06-09 ENCOUNTER — Encounter: Payer: Self-pay | Admitting: Family Medicine

## 2016-06-11 ENCOUNTER — Other Ambulatory Visit: Payer: Self-pay | Admitting: Family Medicine

## 2016-06-11 MED ORDER — TAMSULOSIN HCL 0.4 MG PO CAPS: 0.4000 mg | ORAL_CAPSULE | Freq: Every day | ORAL | 3 refills | Status: DC

## 2016-06-11 MED ORDER — LISINOPRIL 5 MG PO TABS: 5.0000 mg | ORAL_TABLET | Freq: Every day | ORAL | 3 refills | Status: DC

## 2016-06-11 MED ORDER — ATORVASTATIN CALCIUM 20 MG PO TABS: 20.0000 mg | ORAL_TABLET | Freq: Every day | ORAL | 3 refills | Status: DC

## 2016-07-23 DIAGNOSIS — J40 Bronchitis, not specified as acute or chronic: Secondary | ICD-10-CM | POA: Diagnosis not present

## 2016-10-02 DIAGNOSIS — H2511 Age-related nuclear cataract, right eye: Secondary | ICD-10-CM | POA: Diagnosis not present

## 2016-10-02 LAB — HM DIABETES EYE EXAM

## 2016-11-19 ENCOUNTER — Encounter: Payer: Self-pay | Admitting: Family Medicine

## 2016-11-19 ENCOUNTER — Ambulatory Visit (INDEPENDENT_AMBULATORY_CARE_PROVIDER_SITE_OTHER): Payer: Medicare Other | Admitting: Family Medicine

## 2016-11-19 VITALS — BP 122/64 | HR 69 | Temp 98.7°F | Resp 16 | Wt 227.6 lb

## 2016-11-19 DIAGNOSIS — E669 Obesity, unspecified: Secondary | ICD-10-CM

## 2016-11-19 DIAGNOSIS — E7800 Pure hypercholesterolemia, unspecified: Secondary | ICD-10-CM

## 2016-11-19 DIAGNOSIS — E1169 Type 2 diabetes mellitus with other specified complication: Secondary | ICD-10-CM | POA: Diagnosis not present

## 2016-11-19 DIAGNOSIS — Z125 Encounter for screening for malignant neoplasm of prostate: Secondary | ICD-10-CM | POA: Diagnosis not present

## 2016-11-19 LAB — POCT GLYCOSYLATED HEMOGLOBIN (HGB A1C): Hemoglobin A1C: 6.6

## 2016-11-19 NOTE — Patient Instructions (Signed)
We will call you about the lab results.

## 2016-11-19 NOTE — Progress Notes (Signed)
Subjective:     Patient ID: David [name]., male   DOB: Nov 12, 1945, 71 y.o.   MRN: [id number]  HPI  Chief Complaint  Patient presents with  . Diabetes    Patient comes in office today for 6 month follow up last office visit was 05/21/16. At last visit HgbA1C was 6.7%, patient states that he is not actively checking blood sugar at home and has not noticed any wounds or sores on his feet. Patient is working on a well balanced diet and staying active, patient reports good compliance and tolerance on medication. Patient reports that about 09/04/2016 he had diabetic eye exam and it was normal.   States he stays active with dog walking, camping, and yardwork. 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   Review of Systems  Respiratory: Negative for shortness of breath.        Not contemplating smoking cessation at this time. Remains on c-pap.  Cardiovascular: Negative for chest pain and palpitations.       Objective:   Physical Exam  Constitutional: He appears well-developed and well-nourished. No distress.  Lungs: clear Heart: RRR without murmur Lower extremities: no edema; pedal pulses intact, sensation to monofilament intact, no wounds noted.     Assessment:    1. Diabetes mellitus type 2 in obese (HCC) - POCT glycosylated hemoglobin (Hb A1C) - Comprehensive metabolic panel  2. Hypercholesteremia - Lipid panel  3. Screening for prostate cancer - PSA    Plan:    Further f/u pending lab work.

## 2016-11-20 DIAGNOSIS — E7800 Pure hypercholesterolemia, unspecified: Secondary | ICD-10-CM | POA: Diagnosis not present

## 2016-11-20 DIAGNOSIS — E669 Obesity, unspecified: Secondary | ICD-10-CM | POA: Diagnosis not present

## 2016-11-20 DIAGNOSIS — Z125 Encounter for screening for malignant neoplasm of prostate: Secondary | ICD-10-CM | POA: Diagnosis not present

## 2016-11-20 DIAGNOSIS — E1169 Type 2 diabetes mellitus with other specified complication: Secondary | ICD-10-CM | POA: Diagnosis not present

## 2016-11-21 ENCOUNTER — Telehealth: Payer: Self-pay

## 2016-11-21 LAB — COMPREHENSIVE METABOLIC PANEL
A/G RATIO: 2.3 — AB (ref 1.2–2.2)
ALBUMIN: 4.3 g/dL (ref 3.5–4.8)
ALK PHOS: 97 IU/L (ref 39–117)
ALT: 37 IU/L (ref 0–44)
AST: 27 IU/L (ref 0–40)
BILIRUBIN TOTAL: 0.5 mg/dL (ref 0.0–1.2)
BUN / CREAT RATIO: 11 (ref 10–24)
BUN: 10 mg/dL (ref 8–27)
CHLORIDE: 106 mmol/L (ref 96–106)
CO2: 22 mmol/L (ref 20–29)
Calcium: 9.4 mg/dL (ref 8.6–10.2)
Creatinine, Ser: 0.9 mg/dL (ref 0.76–1.27)
GFR calc non Af Amer: 86 mL/min/{1.73_m2} (ref >59)
GFR, EST AFRICAN AMERICAN: 99 mL/min/{1.73_m2} (ref >59)
Globulin, Total: 1.9 g/dL (ref 1.5–4.5)
Glucose: 160 mg/dL — ABNORMAL HIGH (ref 65–99)
POTASSIUM: 5.1 mmol/L (ref 3.5–5.2)
SODIUM: 141 mmol/L (ref 134–144)
TOTAL PROTEIN: 6.2 g/dL (ref 6.0–8.5)

## 2016-11-21 LAB — LIPID PANEL
CHOLESTEROL TOTAL: 135 mg/dL (ref 100–199)
Chol/HDL Ratio: 4.4 ratio (ref 0.0–5.0)
HDL: 31 mg/dL — ABNORMAL LOW (ref >39)
LDL Calculated: 58 mg/dL (ref 0–99)
Triglycerides: 228 mg/dL — ABNORMAL HIGH (ref 0–149)
VLDL CHOLESTEROL CAL: 46 mg/dL — AB (ref 5–40)

## 2016-11-21 LAB — PLEASE NOTE

## 2016-11-21 NOTE — Telephone Encounter (Signed)
-----   Message from Carmon Ginsberg, [place] sent at 11/21/2016  8:57 AM EDT ----- Labs ok, continue current medication and regular exercise.

## 2016-11-21 NOTE — Telephone Encounter (Signed)
Patient advised.KW 

## 2016-11-25 ENCOUNTER — Encounter: Payer: Self-pay | Admitting: Family Medicine

## 2016-11-27 LAB — PSA: Prostate Specific Ag, Serum: 0.4 ng/mL (ref 0.0–4.0)

## 2016-11-27 LAB — SPECIMEN STATUS REPORT

## 2016-12-09 ENCOUNTER — Encounter: Payer: Self-pay | Admitting: Family Medicine

## 2016-12-12 ENCOUNTER — Encounter: Payer: Self-pay | Admitting: Family Medicine

## 2016-12-12 ENCOUNTER — Other Ambulatory Visit: Payer: Self-pay | Admitting: Family Medicine

## 2017-01-13 DIAGNOSIS — L821 Other seborrheic keratosis: Secondary | ICD-10-CM | POA: Diagnosis not present

## 2017-01-13 DIAGNOSIS — D229 Melanocytic nevi, unspecified: Secondary | ICD-10-CM | POA: Diagnosis not present

## 2017-01-13 DIAGNOSIS — Z85820 Personal history of malignant melanoma of skin: Secondary | ICD-10-CM | POA: Diagnosis not present

## 2017-01-13 DIAGNOSIS — D485 Neoplasm of uncertain behavior of skin: Secondary | ICD-10-CM | POA: Diagnosis not present

## 2017-01-13 DIAGNOSIS — L711 Rhinophyma: Secondary | ICD-10-CM | POA: Diagnosis not present

## 2017-01-13 DIAGNOSIS — D1800 Hemangioma unspecified site: Secondary | ICD-10-CM | POA: Diagnosis not present

## 2017-01-13 DIAGNOSIS — L918 Other hypertrophic disorders of the skin: Secondary | ICD-10-CM | POA: Diagnosis not present

## 2017-01-13 DIAGNOSIS — L570 Actinic keratosis: Secondary | ICD-10-CM | POA: Diagnosis not present

## 2017-01-13 HISTORY — DX: Actinic keratosis: L57.0

## 2017-01-15 ENCOUNTER — Ambulatory Visit (INDEPENDENT_AMBULATORY_CARE_PROVIDER_SITE_OTHER): Payer: Medicare Other | Admitting: Physician Assistant

## 2017-01-15 DIAGNOSIS — Z23 Encounter for immunization: Secondary | ICD-10-CM

## 2017-01-15 NOTE — Progress Notes (Signed)
Flu vaccine given today without complication. Patient sat upright for 15 minutes to check for adverse reaction before being released. °

## 2017-02-18 DIAGNOSIS — L570 Actinic keratosis: Secondary | ICD-10-CM | POA: Diagnosis not present

## 2017-03-26 ENCOUNTER — Encounter: Payer: Self-pay | Admitting: Family Medicine

## 2017-03-28 ENCOUNTER — Other Ambulatory Visit: Payer: Self-pay | Admitting: Family Medicine

## 2017-03-28 DIAGNOSIS — E1169 Type 2 diabetes mellitus with other specified complication: Secondary | ICD-10-CM

## 2017-03-28 DIAGNOSIS — E669 Obesity, unspecified: Principal | ICD-10-CM

## 2017-03-28 MED ORDER — LISINOPRIL 5 MG PO TABS: 5.0000 mg | ORAL_TABLET | Freq: Every day | ORAL | 3 refills | Status: DC

## 2017-03-28 MED ORDER — TAMSULOSIN HCL 0.4 MG PO CAPS: 0.4000 mg | ORAL_CAPSULE | Freq: Every day | ORAL | 3 refills | Status: DC

## 2017-03-28 MED ORDER — METFORMIN HCL 500 MG PO TABS: 500.0000 mg | ORAL_TABLET | Freq: Two times a day (BID) | ORAL | 3 refills | Status: DC

## 2017-03-28 MED ORDER — ATORVASTATIN CALCIUM 20 MG PO TABS: 20.0000 mg | ORAL_TABLET | Freq: Every day | ORAL | 3 refills | Status: DC

## 2017-05-22 ENCOUNTER — Ambulatory Visit: Payer: Medicare Other | Admitting: Family Medicine

## 2017-05-23 ENCOUNTER — Encounter: Payer: Self-pay | Admitting: Family Medicine

## 2017-05-23 ENCOUNTER — Ambulatory Visit (INDEPENDENT_AMBULATORY_CARE_PROVIDER_SITE_OTHER): Payer: Medicare Other | Admitting: Family Medicine

## 2017-05-23 VITALS — BP 130/74 | HR 100 | Temp 98.7°F | Resp 16 | Wt 218.2 lb

## 2017-05-23 DIAGNOSIS — E1169 Type 2 diabetes mellitus with other specified complication: Secondary | ICD-10-CM

## 2017-05-23 DIAGNOSIS — E669 Obesity, unspecified: Secondary | ICD-10-CM | POA: Diagnosis not present

## 2017-05-23 DIAGNOSIS — K136 Irritative hyperplasia of oral mucosa: Secondary | ICD-10-CM

## 2017-05-23 LAB — POCT GLYCOSYLATED HEMOGLOBIN (HGB A1C): Hemoglobin A1C: 6.6

## 2017-05-23 MED ORDER — CHLORHEXIDINE GLUCONATE 0.12 % MT SOLN: OROMUCOSAL | 0 refills | Status: DC

## 2017-05-23 NOTE — Progress Notes (Signed)
Subjective:     Patient ID: [name]., male   DOB: February 12, 1946, 71 y.o.   MRN: [id number] Chief Complaint  Patient presents with  . Diabetes    Patient comes in office today for 6 month follow up, patient was last seen 11/03/16 HgbA1C in house was 6.6%. Patient reports good compliance and tolerance on medication.  . Jaw Pain    Patient reports for the past several weeks he has had pain from the left side of his jaw radiating to the left ear, patient states that he has noticed swelling in his gums.    HPI He is pending routine dental exam later this month.  Review of Systems  Respiratory: Negative for shortness of breath.   Cardiovascular: Negative for chest pain and palpitations.       Objective:   Physical Exam  Constitutional: He appears well-developed and well-nourished. No distress.  HENT:  Localizes area of oral irritation to left posterior molar area. No tenderness on percussion of his teeth. No buccal lesion or erythema seen.  Cardiovascular: Normal rate and regular rhythm.  Pulmonary/Chest: Breath sounds normal.  Musculoskeletal: He exhibits no edema ( of distal lower extremities).       Assessment:    1. Diabetes mellitus type 2 in obese [facility]): controlled  2. Irritation of oral cavity - chlorhexidine (PERIDEX) 0.12 % solution; Swish 15 ml for 30 seconds after toothbrushing twice daily.  Dispense: 120 mL; Refill: 0    Plan:    Do f/u with the dentist. Consider updating tetanus vaccine at a pharmacy.

## 2017-05-23 NOTE — Patient Instructions (Signed)
Do follow up with the dentist as scheduled.

## 2017-10-20 ENCOUNTER — Ambulatory Visit (INDEPENDENT_AMBULATORY_CARE_PROVIDER_SITE_OTHER): Payer: Medicare Other | Admitting: Family Medicine

## 2017-10-20 ENCOUNTER — Encounter: Payer: Self-pay | Admitting: Family Medicine

## 2017-10-20 VITALS — BP 110/62 | HR 75 | Temp 98.6°F | Resp 16 | Wt 215.0 lb

## 2017-10-20 DIAGNOSIS — R809 Proteinuria, unspecified: Secondary | ICD-10-CM | POA: Diagnosis not present

## 2017-10-20 DIAGNOSIS — E669 Obesity, unspecified: Secondary | ICD-10-CM

## 2017-10-20 DIAGNOSIS — Z636 Dependent relative needing care at home: Secondary | ICD-10-CM

## 2017-10-20 DIAGNOSIS — E1169 Type 2 diabetes mellitus with other specified complication: Secondary | ICD-10-CM

## 2017-10-20 LAB — POCT GLYCOSYLATED HEMOGLOBIN (HGB A1C)
Est. average glucose Bld gHb Est-mCnc: 148
HEMOGLOBIN A1C: 6.8 % — AB (ref 4.0–5.6)

## 2017-10-20 NOTE — Progress Notes (Signed)
Patient: David [name]. Male    DOB: 11/15/1945   72 y.o.   MRN: [id number] Visit Date: 10/21/2017  Today's Provider: Lavon Paganini, MD   I, Martha Clan, CMA, am acting as scribe for Lavon Paganini, MD.  Chief Complaint  Patient presents with  . Diabetes   Subjective:    HPI      Diabetes Mellitus Type II, Follow-up:   Lab Results  Component Value Date   HGBA1C 6.8 (A) 10/20/2017   HGBA1C 6.6 05/23/2017   HGBA1C 6.6 11/19/2016    Last seen for diabetes 5 months ago.  Management since then includes no changes. He reports good compliance with treatment. He is taking 500 mg po qhs. Did stop taking the morning dose, as this caused nausea. He is not having side effects.  Current symptoms include none and have been stable. Home blood sugar records: fasting range: 180-220. Is checking "intermittently"  Episodes of hypoglycemia? no   Current Insulin Regimen: none Most Recent Eye Exam: May 2018. Negative. Weight trend: decreasing steadily Current diet: well balanced Current exercise: yard work and house work, as well as Education administrator  Pertinent Labs:    Component Value Date/Time   CHOL 135 11/20/2016 0000   TRIG 228 (H) 11/20/2016 0000   HDL 31 (L) 11/20/2016 0000   LDLCALC 58 11/20/2016 0000   CREATININE 0.90 11/20/2016 0000   CREATININE 0.81 04/01/2012 0603    Wt Readings from Last 3 Encounters:  10/20/17 215 lb (97.5 kg)  05/23/17 218 lb 3.2 oz (99 kg)  11/19/16 227 lb 9.6 oz (103.2 kg)    Feeling overwhelmed with being a caregiver for his very ill wife.  She has had multiple strokes, suffers from depression, and has memory issues.  He denies feeling down, depressed or hopeless.  He finds it frustrating to have to motivate her constantly.  He has good support.  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 ------------------------------------------------------------------------   Allergies  Allergen Reactions  . Alcohol     Addiction     Current Outpatient Medications:  .  atorvastatin (LIPITOR) 20 MG tablet, Take 1 tablet (20 mg total) by mouth daily., Disp: 90 tablet, Rfl: 3 .  lisinopril (PRINIVIL,ZESTRIL) 5 MG tablet, Take 1 tablet (5 mg total) by mouth daily., Disp: 90 tablet, Rfl: 3 .  loratadine (CLARITIN) 10 MG tablet, Take 10 mg by mouth daily as needed. , Disp: , Rfl:  .  metFORMIN (GLUCOPHAGE) 500 MG tablet, Take 1 tablet (500 mg total) by mouth 2 (two) times daily with a meal., Disp: 180 tablet, Rfl: 3 .  sildenafil (VIAGRA) 50 MG tablet, Take 1 tablet (50 mg total) by mouth as needed., Disp: 8 tablet, Rfl: 5 .  tamsulosin (FLOMAX) 0.4 MG CAPS capsule, Take 1 capsule (0.4 mg total) by mouth daily., Disp: 90 capsule, Rfl: 3 .  meloxicam (MOBIC) 15 MG tablet, Take 1 tablet (15 mg total) by mouth as needed. (Patient not taking: Reported on 10/20/2017), Disp: 90 tablet, Rfl: 3  Review of Systems  Constitutional: Negative for activity change, appetite change, chills, diaphoresis, fatigue, fever and unexpected weight change.  Eyes: Negative for visual disturbance.  Respiratory: Negative for shortness of breath.   Cardiovascular: Negative for chest pain, palpitations and leg swelling.  Endocrine: Negative for polydipsia, polyphagia and polyuria.  Social History   Tobacco Use  . Smoking status: Current Every Day Smoker    Packs/day: 1.00    Years: 52.00    Pack years: 52.00    Types: Cigarettes    Last attempt to quit: 11/03/2013    Years since quitting: 3.9  . Smokeless tobacco: Never Used  . Tobacco comment: Pt started smoking again. Is smoking 1.25-1.5 PPD  Substance Use Topics  . Alcohol use: No    Comment: Previous Heavy Alcohol Abuse   Objective:   BP 110/62 (BP Location: Left Arm, Patient Position: Sitting, Cuff Size: Large)   Pulse 75   Temp 98.6 F (37  C) (Oral)   Resp 16   Wt 215 lb (97.5 kg)   SpO2 96%   BMI 33.67 kg/m  Vitals:   10/20/17 1550  BP: 110/62  Pulse: 75  Resp: 16  Temp: 98.6 F (37 C)  TempSrc: Oral  SpO2: 96%  Weight: 215 lb (97.5 kg)     Physical Exam  Constitutional: He is oriented to person, place, and time. He appears well-developed and well-nourished. No distress.  HENT:  Head: Normocephalic and atraumatic.  Eyes: Conjunctivae are normal. No scleral icterus.  Neck: Neck supple. No thyromegaly present.  Cardiovascular: Normal rate, regular rhythm, normal heart sounds and intact distal pulses.  No murmur heard. Pulmonary/Chest: Effort normal and breath sounds normal. No respiratory distress. He has no wheezes. He has no rales.  Musculoskeletal: He exhibits no edema.  Lymphadenopathy:    He has no cervical adenopathy.  Neurological: He is alert and oriented to person, place, and time.  Skin: Skin is warm and dry. Capillary refill takes less than 2 seconds. No rash noted.  Psychiatric: He has a normal mood and affect. His behavior is normal.  Vitals reviewed.    Diabetic Foot Exam - Simple   Simple Foot Form Diabetic Foot exam was performed with the following findings:  Yes 10/20/2017  4:50 PM  Visual Inspection No deformities, no ulcerations, no other skin breakdown bilaterally:  Yes Sensation Testing Intact to touch and monofilament testing bilaterally:  Yes Pulse Check Posterior Tibialis and Dorsalis pulse intact bilaterally:  Yes Comments      Results for orders placed or performed in visit on 10/20/17  POCT glycosylated hemoglobin (Hb A1C)  Result Value Ref Range   Hemoglobin A1C 6.8 (A) 4.0 - 5.6 %   Est. average glucose Bld gHb Est-mCnc 148        Assessment & Plan:   Problem List Items Addressed This Visit      Endocrine   Diabetes mellitus type 2 in obese ([place]) - Primary    Well controlled with A1c 6.8 Continue metformin 500mg  qhs - unable to tolerate higher dose If needed  in the future, could consider XR dosing if able to afford Discussed diet and exercise Foot exam completed today Advised to schedule f/u eye exam Continue ACEi for microalbuminuria UTD on pneumococcal vaccines      Relevant Orders   POCT glycosylated hemoglobin (Hb A1C) (Completed)     Other   Microalbuminuria    Continue ACEi      Caregiver stress    Discussed caregiver stress and taking time for yourself Discussed stress relief techniques Also discussed severity of wife's illness to increase understanding and patience          Return in about 3 months (around 01/20/2018) for CPE/AWV.   The entirety of the information documented in the History of Present  Illness, Review of Systems and Physical Exam were personally obtained by me. Portions of this information were initially documented by Raquel Sarna Ratchford, CMA and reviewed by me for thoroughness and accuracy.    Virginia Crews, MD, MPH [facility] 10/21/2017 9:06 AM

## 2017-10-20 NOTE — Assessment & Plan Note (Signed)
Well controlled with A1c 6.8 Continue metformin 500mg  qhs - unable to tolerate higher dose If needed in the future, could consider XR dosing if able to afford Discussed diet and exercise Foot exam completed today Advised to schedule f/u eye exam Continue ACEi for microalbuminuria UTD on pneumococcal vaccines

## 2017-10-20 NOTE — Assessment & Plan Note (Signed)
Continue ACEi

## 2017-10-20 NOTE — Patient Instructions (Signed)
  Diet Recommendations for Diabetes   Starchy (carb) foods include: Bread, rice, pasta, potatoes, corn, crackers, bagels, muffins, all baked goods.  (Fruits, milk, and yogurt also have carbohydrate, but most of these foods will not spike your blood sugar as the starchy foods will.)  A few fruits do cause high blood sugars; use small portions of bananas (limit to 1/2 at a time), grapes, watermelon, and most tropical fruits.    Protein foods include: Meat, fish, poultry, eggs, dairy foods, and beans such as pinto and kidney beans (beans also provide carbohydrate).   1. Eat at least 3 meals and 1-2 snacks per day. Never go more than 4-5 hours while awake without eating. Eat breakfast within the first hour of getting up.   2. Limit starchy foods to TWO per meal and ONE per snack. ONE portion of a starchy  food is equal to the following:   - ONE slice of bread (or its equivalent, such as half of a hamburger bun).   - 1/2 cup of a "scoopable" starchy food such as potatoes or rice.   - 15 grams of carbohydrate as shown on food label.  3. Include at every meal: a protein food, a carb food, and vegetables and/or fruit.   - Obtain twice as many veg's as protein or carbohydrate foods for both lunch and dinner.   - Fresh or frozen veg's are best.   - Try to keep frozen veg's on hand for a quick vegetable serving.        Check with insurance about physical and annual wellness visit coverage

## 2017-10-21 NOTE — Assessment & Plan Note (Signed)
Discussed caregiver stress and taking time for yourself Discussed stress relief techniques Also discussed severity of wife's illness to increase understanding and patience

## 2017-10-24 ENCOUNTER — Encounter: Payer: Self-pay | Admitting: Family Medicine

## 2017-10-25 DIAGNOSIS — K5900 Constipation, unspecified: Secondary | ICD-10-CM | POA: Diagnosis not present

## 2017-10-28 ENCOUNTER — Ambulatory Visit
Admission: RE | Admit: 2017-10-28 | Discharge: 2017-10-28 | Disposition: A | Discharge status: Home / Self Care | Payer: Medicare Other | Source: Ambulatory Visit | Attending: Family Medicine | Admitting: Family Medicine

## 2017-10-28 ENCOUNTER — Ambulatory Visit (INDEPENDENT_AMBULATORY_CARE_PROVIDER_SITE_OTHER): Payer: Medicare Other | Admitting: Family Medicine

## 2017-10-28 ENCOUNTER — Encounter: Payer: Self-pay | Admitting: Family Medicine

## 2017-10-28 VITALS — BP 90/50 | HR 70 | Temp 98.1°F | Resp 16 | Wt 212.0 lb

## 2017-10-28 DIAGNOSIS — K5900 Constipation, unspecified: Secondary | ICD-10-CM | POA: Insufficient documentation

## 2017-10-28 DIAGNOSIS — Z86010 Personal history of colonic polyps: Secondary | ICD-10-CM

## 2017-10-28 NOTE — Patient Instructions (Addendum)
Start Miralax clean out - divide medium miralax into 2 medium gatorades and drink sips every 5 min over several hours - can also take 2 senokot   Constipation, Adult Constipation is when a person has fewer bowel movements in a week than normal, has difficulty having a bowel movement, or has stools that are dry, hard, or larger than normal. Constipation may be caused by an underlying condition. It may become worse with age if a person takes certain medicines and does not take in enough fluids. Follow these instructions at home: Eating and drinking   Eat foods that have a lot of fiber, such as fresh fruits and vegetables, whole grains, and beans.  Limit foods that are high in fat, low in fiber, or overly processed, such as french fries, hamburgers, cookies, candies, and soda.  Drink enough fluid to keep your urine clear or pale yellow. General instructions  Exercise regularly or as told by your health care provider.  Go to the restroom when you have the urge to go. Do not hold it in.  Take over-the-counter and prescription medicines only as told by your health care provider. These include any fiber supplements.  Practice pelvic floor retraining exercises, such as deep breathing while relaxing the lower abdomen and pelvic floor relaxation during bowel movements.  Watch your condition for any changes.  Keep all follow-up visits as told by your health care provider. This is important. Contact a health care provider if:  You have pain that gets worse.  You have a fever.  You do not have a bowel movement after 4 days.  You vomit.  You are not hungry.  You lose weight.  You are bleeding from the anus.  You have thin, pencil-like stools. Get help right away if:  You have a fever and your symptoms suddenly get worse.  You leak stool or have blood in your stool.  Your abdomen is bloated.  You have severe pain in your abdomen.  You feel dizzy or you faint. This  information is not intended to replace advice given to you by your health care provider. Make sure you discuss any questions you have with your health care provider. Document Released: 01/19/2004 Document Revised: 11/10/2015 Document Reviewed: 10/11/2015 Elsevier Interactive Patient Education  2018 [employer].

## 2017-10-28 NOTE — Progress Notes (Signed)
Patient: David [name]. Male    DOB: [birth year]   72 y.o.   MRN: [id number] Visit Date: 10/28/2017  Today's Provider: Lavon Paganini, MD   Chief Complaint  Patient presents with  . Constipation   Subjective:    Constipation  This is a new problem. The current episode started in the past 7 days. Pertinent negatives include no abdominal pain, anorexia, back pain, bloating, diarrhea, difficulty urinating, fecal incontinence, fever, flatus, hematochezia, hemorrhoids, melena, nausea, rectal pain, vomiting or weight loss. He has tried laxatives and stool softeners for the symptoms. The treatment provided mild relief.    Patient states he has had constipation for 1 week. Patient states he has not been able to pass stool except in very small amounts will straining. Patient states he does not feel the need to go, but does feel some pressure. He tried 1 capful of Miralax x2 and prune juice without relief.  Patient states he went to [place], where he was advised to buy otc Senokot. Which only helped mildly. Patient states he feels swollen in the rectal area but there is no pain. No bloody stools.  Has had problems with this in the past around traveling.  Still passing gas.  Allergies  Allergen Reactions  . Alcohol     Addiction     Current Outpatient Medications:  .  atorvastatin (LIPITOR) 20 MG tablet, Take 1 tablet (20 mg total) by mouth daily., Disp: 90 tablet, Rfl: 3 .  lisinopril (PRINIVIL,ZESTRIL) 5 MG tablet, Take 1 tablet (5 mg total) by mouth daily., Disp: 90 tablet, Rfl: 3 .  loratadine (CLARITIN) 10 MG tablet, Take 10 mg by mouth daily as needed. , Disp: , Rfl:  .  meloxicam (MOBIC) 15 MG tablet, Take 1 tablet (15 mg total) by mouth as needed., Disp: 90 tablet, Rfl: 3 .  metFORMIN (GLUCOPHAGE) 500 MG tablet, Take 1 tablet (500 mg total) by mouth 2 (two) times daily with a meal., Disp: 180 tablet, Rfl: 3 .  sildenafil (VIAGRA) 50 MG tablet, Take 1 tablet (50 mg total)  by mouth as needed., Disp: 8 tablet, Rfl: 5 .  tamsulosin (FLOMAX) 0.4 MG CAPS capsule, Take 1 capsule (0.4 mg total) by mouth daily., Disp: 90 capsule, Rfl: 3  Review of Systems  Constitutional: Negative for appetite change, chills, fever and weight loss.  Respiratory: Negative for chest tightness, shortness of breath and wheezing.   Cardiovascular: Negative for chest pain and palpitations.  Gastrointestinal: Positive for constipation. Negative for abdominal pain, anorexia, bloating, diarrhea, flatus, hematochezia, hemorrhoids, melena, nausea, rectal pain and vomiting.  Genitourinary: Negative for difficulty urinating.  Musculoskeletal: Negative for back pain.    Social History   Tobacco Use  . Smoking status: Current Every Day Smoker    Packs/day: 1.00    Years: 52.00    Pack years: 52.00    Types: Cigarettes    Last attempt to quit: 11/03/2013    Years since quitting: 3.9  . Smokeless tobacco: Never Used  . Tobacco comment: Pt started smoking again. Is smoking 1.25-1.5 PPD  Substance Use Topics  . Alcohol use: No    Comment: Previous Heavy Alcohol Abuse   Objective:   BP (!) 90/50 (BP Location: Right Arm, Patient Position: Sitting, Cuff Size: Large)   Pulse 70   Temp 98.1 F (36.7 C) (Oral)   Resp 16   Wt 212 lb (96.2 kg)   SpO2 96%   BMI 33.20 kg/m  Vitals:   10/28/17 0823  BP: (!) 90/50  Pulse: 70  Resp: 16  Temp: 98.1 F (36.7 C)  TempSrc: Oral  SpO2: 96%  Weight: 212 lb (96.2 kg)     Physical Exam  Constitutional: He is oriented to person, place, and time. He appears well-developed and well-nourished. No distress.  HENT:  Head: Normocephalic and atraumatic.  Eyes: Pupils are equal, round, and reactive to light. Conjunctivae are normal. No scleral icterus.  Neck: Neck supple. No thyromegaly present.  Cardiovascular: Normal rate, regular rhythm, normal heart sounds and intact distal pulses.  No murmur heard. Pulmonary/Chest: Effort normal and breath  sounds normal. No respiratory distress. He has no wheezes. He has no rales.  Abdominal: Soft. Bowel sounds are normal. He exhibits no distension and no mass. There is no tenderness. There is no guarding.  Musculoskeletal: He exhibits no edema or deformity.  Lymphadenopathy:    He has no cervical adenopathy.  Neurological: He is alert and oriented to person, place, and time.  Skin: Skin is warm and dry. Capillary refill takes less than 2 seconds.  Vitals reviewed.       Assessment & Plan:   1. Constipation, unspecified constipation type - new problem  - no sings of obstruction with normal passing of flatus, no N/V, no abd pain and benign abd exam - start Miralax clean-out similar to prior to colonoscopy - discussed strict return precautions - patient is due for colonoscopy anyways, so will refer to GI as well - DG Abd 2 Views; Future  2. History of colon polyps - patient's last colonoscopy in 09/2012 with polyps and recommended to repeat in 5 years - Ambulatory referral to Gastroenterology   Return if symptoms worsen or fail to improve.   The entirety of the information documented in the History of Present Illness, Review of Systems and Physical Exam were personally obtained by me. Portions of this information were initially documented by Ival Bible, CMA and reviewed by me for thoroughness and accuracy.    Virginia Crews, MD, MPH [facility] 10/28/2017 8:48 AM

## 2017-11-11 ENCOUNTER — Telehealth: Payer: Self-pay

## 2017-11-11 NOTE — Telephone Encounter (Signed)
Pt advised. States he is feeling much better after MiraLax clean out.

## 2017-11-11 NOTE — Telephone Encounter (Signed)
-----   Message from Virginia Crews, MD sent at 11/10/2017  8:19 AM EDT ----- Hopefully he heard about these results already, but can't tell from chart.  Abdominal XRay shows a lot of stool which is consisitent with constipation.  No signs of obstruction or more worrisome findings.  Wonder if Miralax clean-out helped.  Virginia Crews, MD, MPH [facility] 11/10/2017 8:19 AM

## 2017-11-12 ENCOUNTER — Other Ambulatory Visit: Payer: Self-pay | Admitting: Family Medicine

## 2017-11-12 MED ORDER — FLUTICASONE PROPIONATE 50 MCG/ACT NA SUSP: 2.0000 | Freq: Every day | NASAL | 3 refills | Status: DC

## 2017-11-12 NOTE — Telephone Encounter (Signed)
Patient is requesting a refill on the following medication.   fluticasone (FLONASE) 50 MCG/ACT nasal spray   He uses [facility] mail order pharmacy for this medication.

## 2017-11-14 DIAGNOSIS — H40053 Ocular hypertension, bilateral: Secondary | ICD-10-CM | POA: Diagnosis not present

## 2017-11-14 DIAGNOSIS — H524 Presbyopia: Secondary | ICD-10-CM | POA: Diagnosis not present

## 2017-11-14 DIAGNOSIS — H2513 Age-related nuclear cataract, bilateral: Secondary | ICD-10-CM | POA: Diagnosis not present

## 2017-12-11 DIAGNOSIS — E669 Obesity, unspecified: Secondary | ICD-10-CM | POA: Diagnosis not present

## 2017-12-11 DIAGNOSIS — E1169 Type 2 diabetes mellitus with other specified complication: Secondary | ICD-10-CM | POA: Diagnosis not present

## 2017-12-11 DIAGNOSIS — Z86010 Personal history of colonic polyps: Secondary | ICD-10-CM | POA: Diagnosis not present

## 2018-01-12 DIAGNOSIS — D2230 Melanocytic nevi of unspecified part of face: Secondary | ICD-10-CM | POA: Diagnosis not present

## 2018-01-12 DIAGNOSIS — Z1283 Encounter for screening for malignant neoplasm of skin: Secondary | ICD-10-CM | POA: Diagnosis not present

## 2018-01-12 DIAGNOSIS — L570 Actinic keratosis: Secondary | ICD-10-CM | POA: Diagnosis not present

## 2018-01-12 DIAGNOSIS — Z85820 Personal history of malignant melanoma of skin: Secondary | ICD-10-CM | POA: Diagnosis not present

## 2018-01-12 DIAGNOSIS — L578 Other skin changes due to chronic exposure to nonionizing radiation: Secondary | ICD-10-CM | POA: Diagnosis not present

## 2018-01-12 DIAGNOSIS — D179 Benign lipomatous neoplasm, unspecified: Secondary | ICD-10-CM | POA: Diagnosis not present

## 2018-01-12 DIAGNOSIS — L821 Other seborrheic keratosis: Secondary | ICD-10-CM | POA: Diagnosis not present

## 2018-01-12 DIAGNOSIS — D485 Neoplasm of uncertain behavior of skin: Secondary | ICD-10-CM | POA: Diagnosis not present

## 2018-01-12 DIAGNOSIS — D225 Melanocytic nevi of trunk: Secondary | ICD-10-CM | POA: Diagnosis not present

## 2018-01-12 DIAGNOSIS — L719 Rosacea, unspecified: Secondary | ICD-10-CM | POA: Diagnosis not present

## 2018-01-12 DIAGNOSIS — D1800 Hemangioma unspecified site: Secondary | ICD-10-CM | POA: Diagnosis not present

## 2018-01-12 DIAGNOSIS — D229 Melanocytic nevi, unspecified: Secondary | ICD-10-CM | POA: Diagnosis not present

## 2018-01-21 ENCOUNTER — Encounter: Payer: PRIVATE HEALTH INSURANCE | Admitting: Family Medicine

## 2018-01-21 ENCOUNTER — Ambulatory Visit (INDEPENDENT_AMBULATORY_CARE_PROVIDER_SITE_OTHER): Payer: Medicare Other

## 2018-01-21 VITALS — BP 118/62 | HR 79 | Temp 98.9°F | Ht 67.0 in | Wt 215.0 lb

## 2018-01-21 DIAGNOSIS — Z0000 Encounter for general adult medical examination without abnormal findings: Secondary | ICD-10-CM | POA: Diagnosis not present

## 2018-01-21 DIAGNOSIS — Z23 Encounter for immunization: Secondary | ICD-10-CM | POA: Diagnosis not present

## 2018-01-21 NOTE — Patient Instructions (Addendum)
[name] , Thank you for taking time to come for your Medicare Wellness Visit. I appreciate your ongoing commitment to your health goals. Please review the following plan we discussed and let me know if I can assist you in the future.   Screening recommendations/referrals: Colonoscopy: Up to date Recommended yearly ophthalmology/optometry visit for glaucoma screening and checkup Recommended yearly dental visit for hygiene and checkup  Vaccinations: Influenza vaccine: Up to date Pneumococcal vaccine: Up to date Tdap vaccine: Pt declines today.  Shingles vaccine: Pt declines today.     Advanced directives: Please bring a copy of your POA (Power of Attorney) and/or Living Will to your next appointment.   Conditions/risks identified: Smoking cessation; Obesity- recommend to continue current diet plan of cutting out sodas and junk food to help aid in weight loss.   Next appointment: 02/01/19 @ 1:20 PM for 2020 AWV. Pt declined scheduling a CPE for this year.   Preventive Care 1 Years and Older, Male Preventive care refers to lifestyle choices and visits with your health care provider that can promote health and wellness. What does preventive care include?  A yearly physical exam. This is also called an annual well check.  Dental exams once or twice a year.  Routine eye exams. Ask your health care provider how often you should have your eyes checked.  Personal lifestyle choices, including:  Daily care of your teeth and gums.  Regular physical activity.  Eating a healthy diet.  Avoiding tobacco and drug use.  Limiting alcohol use.  Practicing safe sex.  Taking low doses of aspirin every day.  Taking vitamin and mineral supplements as recommended by your health care provider. What happens during an annual well check? The services and screenings done by your health care provider during your annual well check will depend on your age, overall health, lifestyle risk factors,  and family history of disease. Counseling  Your health care provider may ask you questions about your:  Alcohol use.  Tobacco use.  Drug use.  Emotional well-being.  Home and relationship well-being.  Sexual activity.  Eating habits.  History of falls.  Memory and ability to understand (cognition).  Work and work Statistician. Screening  You may have the following tests or measurements:  Height, weight, and BMI.  Blood pressure.  Lipid and cholesterol levels. These may be checked every 5 years, or more frequently if you are over 56 years old.  Skin check.  Lung cancer screening. You may have this screening every year starting at age 103 if you have a 30-pack-year history of smoking and currently smoke or have quit within the past 15 years.  Fecal occult blood test (FOBT) of the stool. You may have this test every year starting at age 5.  Flexible sigmoidoscopy or colonoscopy. You may have a sigmoidoscopy every 5 years or a colonoscopy every 10 years starting at age 42.  Prostate cancer screening. Recommendations will vary depending on your family history and other risks.  Hepatitis C blood test.  Hepatitis B blood test.  Sexually transmitted disease (STD) testing.  Diabetes screening. This is done by checking your blood sugar (glucose) after you have not eaten for a while (fasting). You may have this done every 1-3 years.  Abdominal aortic aneurysm (AAA) screening. You may need this if you are a current or former smoker.  Osteoporosis. You may be screened starting at age 38 if you are at high risk. Talk with your health care provider about your test results,  treatment options, and if necessary, the need for more tests. Vaccines  Your health care provider may recommend certain vaccines, such as:  Influenza vaccine. This is recommended every year.  Tetanus, diphtheria, and acellular pertussis (Tdap, Td) vaccine. You may need a Td booster every 10  years.  Zoster vaccine. You may need this after age 20.  Pneumococcal 13-valent conjugate (PCV13) vaccine. One dose is recommended after age 16.  Pneumococcal polysaccharide (PPSV23) vaccine. One dose is recommended after age 7. Talk to your health care provider about which screenings and vaccines you need and how often you need them. This information is not intended to replace advice given to you by your health care provider. Make sure you discuss any questions you have with your health care provider. Document Released: 05/19/2015 Document Revised: 01/10/2016 Document Reviewed: 02/21/2015 Elsevier Interactive Patient Education  2017 [place] Prevention in the Home Falls can cause injuries. They can happen to people of all ages. There are many things you can do to make your home safe and to help prevent falls. What can I do on the outside of my home?  Regularly fix the edges of walkways and driveways and fix any cracks.  Remove anything that might make you trip as you walk through a door, such as a raised step or threshold.  Trim any bushes or trees on the path to your home.  Use bright outdoor lighting.  Clear any walking paths of anything that might make someone trip, such as rocks or tools.  Regularly check to see if handrails are loose or broken. Make sure that both sides of any steps have handrails.  Any raised decks and porches should have guardrails on the edges.  Have any leaves, snow, or ice cleared regularly.  Use sand or salt on walking paths during winter.  Clean up any spills in your garage right away. This includes oil or grease spills. What can I do in the bathroom?  Use night lights.  Install grab bars by the toilet and in the tub and shower. Do not use towel bars as grab bars.  Use non-skid mats or decals in the tub or shower.  If you need to sit down in the shower, use a plastic, non-slip stool.  Keep the floor dry. Clean up any water that  spills on the floor as soon as it happens.  Remove soap buildup in the tub or shower regularly.  Attach bath mats securely with double-sided non-slip rug tape.  Do not have throw rugs and other things on the floor that can make you trip. What can I do in the bedroom?  Use night lights.  Make sure that you have a light by your bed that is easy to reach.  Do not use any sheets or blankets that are too big for your bed. They should not hang down onto the floor.  Have a firm chair that has side arms. You can use this for support while you get dressed.  Do not have throw rugs and other things on the floor that can make you trip. What can I do in the kitchen?  Clean up any spills right away.  Avoid walking on wet floors.  Keep items that you use a lot in easy-to-reach places.  If you need to reach something above you, use a strong step stool that has a grab bar.  Keep electrical cords out of the way.  Do not use floor polish or wax that makes floors  slippery. If you must use wax, use non-skid floor wax.  Do not have throw rugs and other things on the floor that can make you trip. What can I do with my stairs?  Do not leave any items on the stairs.  Make sure that there are handrails on both sides of the stairs and use them. Fix handrails that are broken or loose. Make sure that handrails are as long as the stairways.  Check any carpeting to make sure that it is firmly attached to the stairs. Fix any carpet that is loose or worn.  Avoid having throw rugs at the top or bottom of the stairs. If you do have throw rugs, attach them to the floor with carpet tape.  Make sure that you have a light switch at the top of the stairs and the bottom of the stairs. If you do not have them, ask someone to add them for you. What else can I do to help prevent falls?  Wear shoes that:  Do not have high heels.  Have rubber bottoms.  Are comfortable and fit you well.  Are closed at the  toe. Do not wear sandals.  If you use a stepladder:  Make sure that it is fully opened. Do not climb a closed stepladder.  Make sure that both sides of the stepladder are locked into place.  Ask someone to hold it for you, if possible.  Clearly mark and make sure that you can see:  Any grab bars or handrails.  First and last steps.  Where the edge of each step is.  Use tools that help you move around (mobility aids) if they are needed. These include:  Canes.  Walkers.  Scooters.  Crutches.  Turn on the lights when you go into a dark area. Replace any light bulbs as soon as they burn out.  Set up your furniture so you have a clear path. Avoid moving your furniture around.  If any of your floors are uneven, fix them.  If there are any pets around you, be aware of where they are.  Review your medicines with your doctor. Some medicines can make you feel dizzy. This can increase your chance of falling. Ask your doctor what other things that you can do to help prevent falls. This information is not intended to replace advice given to you by your health care provider. Make sure you discuss any questions you have with your health care provider. Document Released: 02/16/2009 Document Revised: 09/28/2015 Document Reviewed: 05/27/2014 Elsevier Interactive Patient Education  2017 [employer].

## 2018-01-21 NOTE — Progress Notes (Signed)
Subjective:   David [name]. is a 72 y.o. male who presents for an Initial Medicare Annual Wellness Visit.  Review of Systems  N/A  Cardiac Risk Factors include: advanced age (>39men, >42 women);diabetes mellitus;smoking/ tobacco exposure;dyslipidemia;hypertension;male gender;obesity (BMI >30kg/m2)    Objective:    Today's Vitals   01/21/18 1334  BP: 118/62  Pulse: 79  Temp: 98.9 F (37.2 C)  TempSrc: Oral  Weight: 215 lb (97.5 kg)  Height: 5\' 7"  (1.702 m)  PainSc: 0-No pain   Body mass index is 33.67 kg/m.  Advanced Directives 01/21/2018 03/23/2016 07/24/2015 07/06/2015 05/24/2015 01/18/2015  Does Patient Have a Medical Advance Directive? Yes No Yes Yes Yes Yes  Type of Paramedic of [place];Living will - [place];Living will [place];Living will [place];Living will Living will;Healthcare Power of [place] in Chart? No - copy requested - - - - -  Would patient like information on creating a medical advance directive? - No - patient declined information - - - -    Current Medications (verified) Outpatient Encounter Medications as of 01/21/2018  Medication Sig  . atorvastatin (LIPITOR) 20 MG tablet Take 1 tablet (20 mg total) by mouth daily.  . fluticasone (FLONASE) 50 MCG/ACT nasal spray Place 2 sprays into both nostrils daily.  Marland Kitchen lisinopril (PRINIVIL,ZESTRIL) 5 MG tablet Take 1 tablet (5 mg total) by mouth daily.  Marland Kitchen loratadine (CLARITIN) 10 MG tablet Take 10 mg by mouth daily.   . meloxicam (MOBIC) 15 MG tablet Take 1 tablet (15 mg total) by mouth as needed.  . metFORMIN (GLUCOPHAGE) 500 MG tablet Take 1 tablet (500 mg total) by mouth 2 (two) times daily with a meal. (Patient taking differently: Take 500 mg by mouth daily with breakfast. )  . sildenafil (VIAGRA) 50 MG tablet Take 1 tablet (50 mg total) by mouth as needed.  . tamsulosin (FLOMAX) 0.4 MG  CAPS capsule Take 1 capsule (0.4 mg total) by mouth daily.   No facility-administered encounter medications on file as of 01/21/2018.     Allergies (verified) Alcohol   History: Past Medical History:  Diagnosis Date  . Allergy   . COPD (chronic obstructive pulmonary disease) ([place])   . Depression   . Diabetes mellitus without complication ([place])   . Hyperlipidemia    Past Surgical History:  Procedure Laterality Date  . CHOLECYSTECTOMY  2001  . MELANOMA EXCISION  1998  . TONSILLECTOMY AND ADENOIDECTOMY  1953   Family History  Problem Relation Age of Onset  . Cervical cancer Mother   . Colon cancer Mother   . Colon polyps Brother   . Cervical cancer Maternal Grandmother   . Diabetes Maternal Grandmother   . Tuberculosis Maternal Grandmother   . Lung cancer Maternal Grandfather   . Cervical cancer Maternal Aunt    Social History   Socioeconomic History  . Marital status: Married    Spouse name: Not on file  . Number of children: 2  . Years of education: College  . Highest education level: Bachelor's degree (e.g., BA, AB, BS)  Occupational History    Employer: [employer]    Comment: retired  Scientific laboratory technician  . Financial resource strain: Not hard at all  . Food insecurity:    Worry: Never true    Inability: Never true  . Transportation needs:    Medical: No    Non-medical: No  Tobacco Use  . Smoking  status: Current Every Day Smoker    Packs/day: 1.00    Years: 52.00    Pack years: 52.00    Types: Cigarettes    Last attempt to quit: 11/03/2013    Years since quitting: 4.2  . Smokeless tobacco: Never Used  . Tobacco comment: Pt started smoking again. Is smoking 1.25-1.5 PPD  Substance and Sexual Activity  . Alcohol use: No    Comment: Previous Heavy Alcohol Abuse  . Drug use: No  . Sexual activity: Not on file  Lifestyle  . Physical activity:    Days per week: Not on file    Minutes per session: Not on file  . Stress: Rather much  Relationships  . Social  connections:    Talks on phone: Not on file    Gets together: Not on file    Attends religious service: Not on file    Active member of club or organization: Not on file    Attends meetings of clubs or organizations: Not on file    Relationship status: Not on file  Other Topics Concern  . Not on file  Social History Narrative  . Not on file   Tobacco Counseling Ready to quit: No Counseling given: No Comment: Pt started smoking again. Is smoking 1.25-1.5 PPD   Clinical Intake:  Pre-visit preparation completed: Yes  Pain : No/denies pain Pain Score: 0-No pain     Nutritional Status: BMI > 30  Obese Nutritional Risks: None Diabetes: Yes(type 2) CBG done?: No Did pt. bring in CBG monitor from home?: No  How often do you need to have someone help you when you read instructions, pamphlets, or other written materials from your doctor or pharmacy?: 1 - Never  Interpreter Needed?: No  Information entered by :: [facility], LPN  Activities of Daily Living In your present state of health, do you have any difficulty performing the following activities: 01/21/2018  Hearing? N  Vision? N  Difficulty concentrating or making decisions? N  Walking or climbing stairs? N  Dressing or bathing? N  Doing errands, shopping? N  Preparing Food and eating ? N  Using the Toilet? N  In the past six months, have you accidently leaked urine? Y  Comment Occasionally, does not wear protection.   Do you have problems with loss of bowel control? N  Managing your Medications? N  Managing your Finances? N  Housekeeping or managing your Housekeeping? N  Some recent data might be hidden     Immunizations and Health Maintenance Immunization History  Administered Date(s) Administered  . Influenza Split 02/27/2012  . Influenza, High Dose Seasonal PF 02/19/2016, 01/15/2017, 01/21/2018  . Influenza,inj,Quad PF,6+ Mos 02/18/2014  . Influenza-Unspecified 03/07/2015  . Pneumococcal Conjugate-13  06/08/2014  . Pneumococcal Polysaccharide-23 12/16/2011  . Td 09/11/2004  . Zoster 02/27/2012   Health Maintenance Due  Topic Date Due  . TETANUS/TDAP  09/12/2014    Patient Care Team: Virginia Crews, MD as PCP - General (Family Medicine) Birder Robson, MD as Referring Physician (Ophthalmology) Ralene Bathe, MD (Dermatology)  Indicate any recent Medical Services you may have received from other than [facility] providers in the past year (date may be approximate).    Assessment:   This is a routine wellness examination for [employer].  Hearing/Vision screen Vision Screening Comments: Pt sees Dr George Ina for vision checks yearly.   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 DIET - REDUCE CALORIE INTAKE     Recommend to continue current diet plan of cutting out sodas and junk food to help aid in weight loss.       Depression Screen PHQ 2/9 Scores 01/21/2018 05/24/2015 10/21/2014  PHQ - 2 Score 1 0 0  PHQ- 9 Score 1 - -    Fall Risk Fall Risk  01/21/2018 10/28/2017 05/24/2015 10/21/2014  Falls in the past year? No No No No    Is the patient's home free of loose throw rugs in walkways, pet beds, electrical cords, etc?   yes      Grab bars in the bathroom? no      Handrails on the stairs?   no      Adequate lighting?   yes  Timed Get Up and Go performed: N/A  Cognitive Function:      6CIT Screen 01/21/2018  What Year? 0 points  What month? 0 points  What time? 0 points  Count back from 20 0 points  Months in reverse 0 points  Repeat phrase 0 points  Total Score 0    Screening Tests Health Maintenance  Topic Date Due  . TETANUS/TDAP  09/12/2014  . HEMOGLOBIN A1C  04/21/2018  . OPHTHALMOLOGY EXAM  10/05/2018  . FOOT EXAM  10/21/2018  . COLONOSCOPY  09/30/2022  . INFLUENZA  VACCINE  Completed  . Hepatitis C Screening  Completed  . PNA vac Low Risk Adult  Completed    Qualifies for Shingles Vaccine? Due for Shingles vaccine. Declined my offer to administer today. Education has been provided regarding the importance of this vaccine. Pt has been advised to call her insurance company to determine her out of pocket expense. Advised she may also receive this vaccine at her local pharmacy or Health Dept. Verbalized acceptance and understanding.  Cancer Screenings: Lung: Low Dose CT Chest recommended if Age 12-80 years, 30 pack-year currently smoking OR have quit w/in 15years. Patient does qualify. An Epic message has been sent to Burgess Estelle, RN (Oncology Nurse Navigator) regarding the possible need for this exam. Raquel Sarna will review the patient's chart to determine if the patient truly qualifies for the exam. If the patient qualifies, Raquel Sarna will order the Low Dose CT of the chest to facilitate the scheduling of this exam. Colorectal: Up to date  Additional Screenings:  Hepatitis C Screening: Up to date     Plan:  I have personally reviewed and addressed the Medicare Annual Wellness questionnaire and have noted the following in the patient's chart:  A. Medical and social history B. Use of alcohol, tobacco or illicit drugs  C. Current medications and supplements D. Functional ability and status E.  Nutritional status F.  Physical activity G. Advance directives H. List of other physicians I.  Hospitalizations, surgeries, and ER visits in previous 12 months J.  [place] such as hearing and vision if needed, cognitive and depression L. Referrals and appointments - none  In addition, I have reviewed and discussed with patient certain preventive protocols, quality metrics, and best practice recommendations. A written personalized care plan for preventive services as well as general preventive health recommendations were provided to patient.  See attached  scanned questionnaire for additional information.   Signed,  David Neighbors, LPN Nurse Health Advisor   Nurse Recommendations: Pt declined the tetanus vaccine today. An Epic message has been sent to Burgess Estelle, Therapist, sports (Oncology  Nurse Navigator) regarding the possible need for this exam.

## 2018-01-22 ENCOUNTER — Telehealth: Payer: Self-pay | Admitting: *Deleted

## 2018-01-22 DIAGNOSIS — Z122 Encounter for screening for malignant neoplasm of respiratory organs: Secondary | ICD-10-CM

## 2018-01-22 DIAGNOSIS — Z87891 Personal history of nicotine dependence: Secondary | ICD-10-CM

## 2018-01-22 NOTE — Telephone Encounter (Signed)
Received referral for initial lung cancer screening scan. Contacted patient and obtained smoking history,(current, 52 pack year) as well as answering questions related to screening process. Patient denies signs of lung cancer such as weight loss or hemoptysis. Patient denies comorbidity that would prevent curative treatment if lung cancer were found. Patient is scheduled for shared decision making visit and CT scan on 02/10/18.

## 2018-02-05 DIAGNOSIS — H2513 Age-related nuclear cataract, bilateral: Secondary | ICD-10-CM | POA: Diagnosis not present

## 2018-02-05 LAB — HM DIABETES EYE EXAM

## 2018-02-10 ENCOUNTER — Ambulatory Visit
Admission: RE | Admit: 2018-02-10 | Discharge: 2018-02-10 | Disposition: A | Discharge status: Home / Self Care | Payer: Medicare Other | Source: Ambulatory Visit | Attending: Oncology | Admitting: Oncology

## 2018-02-10 ENCOUNTER — Inpatient Hospital Stay: Payer: Medicare Other | Attending: Nurse Practitioner | Admitting: Nurse Practitioner

## 2018-02-10 DIAGNOSIS — Z122 Encounter for screening for malignant neoplasm of respiratory organs: Secondary | ICD-10-CM | POA: Insufficient documentation

## 2018-02-10 DIAGNOSIS — I700 Atherosclerosis of aorta: Secondary | ICD-10-CM | POA: Diagnosis not present

## 2018-02-10 DIAGNOSIS — Z87891 Personal history of nicotine dependence: Secondary | ICD-10-CM | POA: Diagnosis not present

## 2018-02-10 DIAGNOSIS — I2510 Atherosclerotic heart disease of native coronary artery without angina pectoris: Secondary | ICD-10-CM | POA: Insufficient documentation

## 2018-02-10 DIAGNOSIS — F17210 Nicotine dependence, cigarettes, uncomplicated: Secondary | ICD-10-CM | POA: Diagnosis not present

## 2018-02-10 NOTE — Progress Notes (Signed)
In accordance with CMS guidelines, patient has met eligibility criteria including age, absence of signs or symptoms of lung cancer.  Social History   Tobacco Use  . Smoking status: Current Every Day Smoker    Packs/day: 1.00    Years: 52.00    Pack years: 52.00    Types: Cigarettes    Last attempt to quit: 11/03/2013    Years since quitting: 4.2  . Smokeless tobacco: Never Used  . Tobacco comment: Pt started smoking again. Is smoking 1.25-1.5 PPD  Substance Use Topics  . Alcohol use: No    Comment: Previous Heavy Alcohol Abuse  . Drug use: No      A shared decision-making session was conducted prior to the performance of CT scan. This includes one or more decision aids, includes benefits and harms of screening, follow-up diagnostic testing, over-diagnosis, false positive rate, and total radiation exposure.   Counseling on the importance of adherence to annual lung cancer LDCT screening, impact of co-morbidities, and ability or willingness to undergo diagnosis and treatment is imperative for compliance of the program.   Counseling on the importance of continued smoking cessation for former smokers; the importance of smoking cessation for current smokers, and information about tobacco cessation interventions have been given to patient including [place] [facility]uit [facility] and 1800 quit [place] programs.   Written order for lung cancer screening with LDCT has been given to the patient and any and all questions have been answered to the best of my abilities.    Yearly follow up will be coordinated by Shawn Perkins, Thoracic Navigator.  Lauren Allen, DNP, AGNP-C [facility] [phone] (work cell) [phone] (office) 02/10/18 3:22 PM   

## 2018-02-12 ENCOUNTER — Encounter: Payer: Self-pay | Admitting: *Deleted

## 2018-02-13 ENCOUNTER — Encounter: Payer: Self-pay | Admitting: Family Medicine

## 2018-05-07 ENCOUNTER — Encounter: Payer: Self-pay | Admitting: *Deleted

## 2018-05-08 ENCOUNTER — Ambulatory Visit: Payer: Medicare Other | Admitting: Anesthesiology

## 2018-05-08 ENCOUNTER — Encounter
Admission: RE | Disposition: A | Discharge status: Home / Self Care | Payer: Self-pay | Source: Ambulatory Visit | Attending: Unknown Physician Specialty

## 2018-05-08 ENCOUNTER — Encounter: Payer: Self-pay | Admitting: *Deleted

## 2018-05-08 ENCOUNTER — Ambulatory Visit
Admission: RE | Admit: 2018-05-08 | Discharge: 2018-05-08 | Disposition: A | Discharge status: Home / Self Care | Payer: Medicare Other | Source: Ambulatory Visit | Attending: Unknown Physician Specialty | Admitting: Unknown Physician Specialty

## 2018-05-08 DIAGNOSIS — G4730 Sleep apnea, unspecified: Secondary | ICD-10-CM | POA: Insufficient documentation

## 2018-05-08 DIAGNOSIS — Z79899 Other long term (current) drug therapy: Secondary | ICD-10-CM | POA: Diagnosis not present

## 2018-05-08 DIAGNOSIS — D126 Benign neoplasm of colon, unspecified: Secondary | ICD-10-CM | POA: Diagnosis not present

## 2018-05-08 DIAGNOSIS — Z7951 Long term (current) use of inhaled steroids: Secondary | ICD-10-CM | POA: Insufficient documentation

## 2018-05-08 DIAGNOSIS — D122 Benign neoplasm of ascending colon: Secondary | ICD-10-CM | POA: Insufficient documentation

## 2018-05-08 DIAGNOSIS — F17210 Nicotine dependence, cigarettes, uncomplicated: Secondary | ICD-10-CM | POA: Insufficient documentation

## 2018-05-08 DIAGNOSIS — Z85820 Personal history of malignant melanoma of skin: Secondary | ICD-10-CM | POA: Insufficient documentation

## 2018-05-08 DIAGNOSIS — J449 Chronic obstructive pulmonary disease, unspecified: Secondary | ICD-10-CM | POA: Diagnosis not present

## 2018-05-08 DIAGNOSIS — K648 Other hemorrhoids: Secondary | ICD-10-CM | POA: Diagnosis not present

## 2018-05-08 DIAGNOSIS — E785 Hyperlipidemia, unspecified: Secondary | ICD-10-CM | POA: Insufficient documentation

## 2018-05-08 DIAGNOSIS — Z86010 Personal history of colonic polyps: Secondary | ICD-10-CM | POA: Insufficient documentation

## 2018-05-08 DIAGNOSIS — Z8371 Family history of colonic polyps: Secondary | ICD-10-CM | POA: Diagnosis not present

## 2018-05-08 DIAGNOSIS — D123 Benign neoplasm of transverse colon: Secondary | ICD-10-CM | POA: Diagnosis not present

## 2018-05-08 DIAGNOSIS — Z7984 Long term (current) use of oral hypoglycemic drugs: Secondary | ICD-10-CM | POA: Insufficient documentation

## 2018-05-08 DIAGNOSIS — E119 Type 2 diabetes mellitus without complications: Secondary | ICD-10-CM | POA: Insufficient documentation

## 2018-05-08 DIAGNOSIS — Z1211 Encounter for screening for malignant neoplasm of colon: Secondary | ICD-10-CM | POA: Diagnosis not present

## 2018-05-08 DIAGNOSIS — K635 Polyp of colon: Secondary | ICD-10-CM | POA: Diagnosis not present

## 2018-05-08 DIAGNOSIS — K5730 Diverticulosis of large intestine without perforation or abscess without bleeding: Secondary | ICD-10-CM | POA: Insufficient documentation

## 2018-05-08 DIAGNOSIS — K5790 Diverticulosis of intestine, part unspecified, without perforation or abscess without bleeding: Secondary | ICD-10-CM | POA: Diagnosis not present

## 2018-05-08 HISTORY — DX: Sleep apnea, unspecified: G47.30

## 2018-05-08 HISTORY — PX: COLONOSCOPY WITH PROPOFOL: SHX5780

## 2018-05-08 HISTORY — DX: Proteinuria, unspecified: R80.9

## 2018-05-08 LAB — GLUCOSE, CAPILLARY: Glucose-Capillary: 131 mg/dL — ABNORMAL HIGH (ref 70–99)

## 2018-05-08 SURGERY — COLONOSCOPY WITH PROPOFOL
Anesthesia: General

## 2018-05-08 MED ORDER — FENTANYL CITRATE (PF) 100 MCG/2ML IJ SOLN: 25.0000 ug | INTRAMUSCULAR | Status: DC | PRN

## 2018-05-08 MED ORDER — PROPOFOL 500 MG/50ML IV EMUL
INTRAVENOUS | Status: AC
  Filled 2018-05-08: qty 50

## 2018-05-08 MED ORDER — SODIUM CHLORIDE 0.9 % IV SOLN
INTRAVENOUS | Status: DC
  Administered 2018-05-08: 1000 mL via INTRAVENOUS

## 2018-05-08 MED ORDER — ONDANSETRON HCL 4 MG/2ML IJ SOLN: 4.0000 mg | Freq: Once | INTRAMUSCULAR | Status: DC | PRN

## 2018-05-08 MED ORDER — PROPOFOL 10 MG/ML IV BOLUS
INTRAVENOUS | Status: DC | PRN
  Administered 2018-05-08: 60 mg via INTRAVENOUS

## 2018-05-08 MED ORDER — LIDOCAINE HCL (PF) 2 % IJ SOLN
INTRAMUSCULAR | Status: AC
  Filled 2018-05-08: qty 10

## 2018-05-08 MED ORDER — SODIUM CHLORIDE 0.9 % IV SOLN: INTRAVENOUS | Status: DC

## 2018-05-08 MED ORDER — LIDOCAINE HCL (CARDIAC) PF 100 MG/5ML IV SOSY
PREFILLED_SYRINGE | INTRAVENOUS | Status: DC | PRN
  Administered 2018-05-08: 50 mg via INTRAVENOUS

## 2018-05-08 MED ORDER — PROPOFOL 500 MG/50ML IV EMUL
INTRAVENOUS | Status: DC | PRN
  Administered 2018-05-08: 175 ug/kg/min via INTRAVENOUS

## 2018-05-08 NOTE — Anesthesia Post-op Follow-up Note (Signed)
Anesthesia QCDR form completed.        

## 2018-05-08 NOTE — Transfer of Care (Signed)
Immediate Anesthesia Transfer of Care Note  Patient: David [name].  Procedure(s) Performed: COLONOSCOPY WITH PROPOFOL (N/A )  Patient Location: PACU  Anesthesia Type:General  Level of Consciousness: awake, alert  and oriented  Airway & Oxygen Therapy: Patient Spontanous Breathing and Patient connected to nasal cannula oxygen  Post-op Assessment: Report given to RN and Post -op Vital signs reviewed and stable  Post vital signs: Reviewed and stable  Last Vitals:  Vitals Value Taken Time  BP 104/58 05/08/2018 10:09 AM  Temp 36.1 C 05/08/2018 10:09 AM  Pulse 72 05/08/2018 10:09 AM  Resp 18 05/08/2018 10:09 AM  SpO2 99 % 05/08/2018 10:09 AM    Last Pain:  Vitals:   05/08/18 1009  TempSrc: Tympanic  PainSc:          Complications: No apparent anesthesia complications

## 2018-05-08 NOTE — H&P (Signed)
Primary Care Physician:  Virginia Crews, MD Primary Gastroenterologist:  Dr. Vira Agar  Pre-Procedure History & Physical: HPI:  David Stannard. is a 73 y.o. male is here for an colonoscopy.   Past Medical History:  Diagnosis Date  . Allergy   . COPD (chronic obstructive pulmonary disease) ([place])    misdiagnosed  . Depression   . Diabetes mellitus without complication ([place])   . Hyperlipidemia   . Microalbuminuria   . Sleep apnea     Past Surgical History:  Procedure Laterality Date  . broken right arm    . CHOLECYSTECTOMY  2001  . MELANOMA EXCISION  1998  . TONSILLECTOMY AND ADENOIDECTOMY  1953    Prior to Admission medications   Medication Sig Start Date End Date Taking? Authorizing Provider  atorvastatin (LIPITOR) 20 MG tablet Take 1 tablet (20 mg total) by mouth daily. 03/28/17   Carmon Ginsberg, PA  fluticasone (FLONASE) 50 MCG/ACT nasal spray Place 2 sprays into both nostrils daily. 11/12/17   Virginia Crews, MD  lisinopril (PRINIVIL,ZESTRIL) 5 MG tablet Take 1 tablet (5 mg total) by mouth daily. 03/28/17   Carmon Ginsberg, PA  loratadine (CLARITIN) 10 MG tablet Take 10 mg by mouth daily.     [provider]  meloxicam (MOBIC) 15 MG tablet Take 1 tablet (15 mg total) by mouth as needed. 07/25/15   Margarita Rana, MD  metFORMIN (GLUCOPHAGE) 500 MG tablet Take 1 tablet (500 mg total) by mouth 2 (two) times daily with a meal. Patient taking differently: Take 500 mg by mouth daily with breakfast.  03/28/17   Carmon Ginsberg, PA  sildenafil (VIAGRA) 50 MG tablet Take 1 tablet (50 mg total) by mouth as needed. 10/18/15   Margarita Rana, MD  tamsulosin (FLOMAX) 0.4 MG CAPS capsule Take 1 capsule (0.4 mg total) by mouth daily. 03/28/17   Carmon Ginsberg, PA    Allergies as of 12/15/2017 - Review Complete 10/28/2017  Allergen Reaction Noted  . Alcohol  10/21/2014    Family History  Problem Relation Age of Onset  . Cervical cancer Mother   . Colon cancer  Mother   . Colon polyps Brother   . Cervical cancer Maternal Grandmother   . Diabetes Maternal Grandmother   . Tuberculosis Maternal Grandmother   . Lung cancer Maternal Grandfather   . Cervical cancer Maternal Aunt     Social History   Socioeconomic History  . Marital status: Married    Spouse name: Not on file  . Number of children: 2  . Years of education: College  . Highest education level: Bachelor's degree (e.g., BA, AB, BS)  Occupational History    Employer: [employer]    Comment: retired  Scientific laboratory technician  . Financial resource strain: Not hard at all  . Food insecurity:    Worry: Never true    Inability: Never true  . Transportation needs:    Medical: No    Non-medical: No  Tobacco Use  . Smoking status: Current Every Day Smoker    Packs/day: 1.00    Years: 52.00    Pack years: 52.00    Types: Cigarettes    Last attempt to quit: 11/03/2013    Years since quitting: 4.5  . Smokeless tobacco: Never Used  . Tobacco comment: Pt started smoking again. Is smoking 1.25-1.5 PPD  Substance and Sexual Activity  . Alcohol use: No    Comment: Previous Heavy Alcohol Abuse  . Drug use: No  . Sexual activity: Not  on file  Lifestyle  . Physical activity:    Days per week: Not on file    Minutes per session: Not on file  . Stress: Rather much  Relationships  . Social connections:    Talks on phone: Not on file    Gets together: Not on file    Attends religious service: Not on file    Active member of club or organization: Not on file    Attends meetings of clubs or organizations: Not on file    Relationship status: Not on file  . Intimate partner violence:    Fear of current or ex partner: Not on file    Emotionally abused: Not on file    Physically abused: Not on file    Forced sexual activity: Not on file  Other Topics Concern  . Not on file  Social History Narrative  . Not on file    Review of Systems: See HPI, otherwise negative ROS  Physical Exam: BP 136/80    Pulse 68   Temp (!) 97.5 F (36.4 C) (Tympanic)   Resp 18   Ht 5\' 7"  (1.702 m)   Wt 93.4 kg   SpO2 99%   BMI 32.26 kg/m  General:   Alert,  pleasant and cooperative in NAD Head:  Normocephalic and atraumatic. Neck:  Supple; no masses or thyromegaly. Lungs:  Clear throughout to auscultation.    Heart:  Regular rate and rhythm. Abdomen:  Soft, nontender and nondistended. Normal bowel sounds, without guarding, and without rebound.   Neurologic:  Alert and  oriented x4;  grossly normal neurologically.  Impression/Plan: David Mutter. is here for an colonoscopy to be performed for follow up [place] [facility] [place] colon polyps last one done 09/29/2012.  Risks, benefits, limitations, and alternatives regarding  colonoscopy have been reviewed with the patient.  Questions have been answered.  All parties agreeable.   Gaylyn Cheers, MD  05/08/2018, 9:28 AM

## 2018-05-08 NOTE — Anesthesia Procedure Notes (Signed)
Date/Time: 05/08/2018 9:30 AM Performed by: Johnna Acosta, CRNA Pre-anesthesia Checklist: Patient identified, Emergency Drugs available, Suction available, Patient being monitored and Timeout performed Patient Re-evaluated:Patient Re-evaluated prior to induction Oxygen Delivery Method: Nasal cannula Preoxygenation: Pre-oxygenation with 100% oxygen Induction Type: IV induction

## 2018-05-08 NOTE — Op Note (Signed)
[facility] Gastroenterology Patient Name: David [name] Procedure Date: 05/08/2018 9:27 AM MRN: [id number] Account #: [id number] Date of Birth: 1945/09/29 Admit Type: Outpatient Age: 73 Room: [facility] ENDO ROOM 1 Gender: Male Note Status: Finalized Procedure:            Colonoscopy Indications:          High risk colon cancer surveillance: Personal history                        of colonic polyps Providers:            Manya Silvas, MD Referring MD:         Dionne Bucy. Bacigalupo (Referring MD) Medicines:            Propofol per Anesthesia Complications:        No immediate complications. Procedure:            Pre-Anesthesia Assessment:                       - After reviewing the risks and benefits, the patient                        was deemed in satisfactory condition to undergo the                        procedure.                       After obtaining informed consent, the colonoscope was                        passed under direct vision. Throughout the procedure,                        the patient's blood pressure, pulse, and oxygen                        saturations were monitored continuously. The                        Colonoscope was introduced through the anus and                        advanced to the the cecum, identified by appendiceal                        orifice and ileocecal valve. The colonoscopy was                        performed without difficulty. The patient tolerated the                        procedure well. The quality of the bowel preparation                        was good. Findings:      A diminutive polyp was found in the ascending colon. The polyp was       sessile. The polyp was removed with a jumbo cold forceps. Resection and       retrieval were complete.      A small polyp was found  in the ascending colon. The polyp was sessile.       The polyp was removed with a hot snare. Resection and retrieval were       complete. To  prevent bleeding after the polypectomy, one hemostatic clip       was successfully placed. There was no bleeding during, or at the end, of       the procedure.      A small polyp was found in the mid ascending colon. The polyp was       sessile. The polyp was removed with a hot snare. Resection and retrieval       were complete.      Four sessile polyps were found in the transverse colon. The polyps were       diminutive in size. These polyps were removed with a jumbo cold forceps.       Resection and retrieval were complete.      Multiple small and large-mouthed diverticula were found in the sigmoid       colon.      The exam was otherwise without abnormality. Impression:           - One diminutive polyp in the ascending colon, removed                        with a jumbo cold forceps. Resected and retrieved.                       - One small polyp in the ascending colon, removed with                        a hot snare. Resected and retrieved. Clip was placed.                       - One small polyp in the mid ascending colon, removed                        with a hot snare. Resected and retrieved.                       - Four diminutive polyps in the transverse colon,                        removed with a jumbo cold forceps. Resected and                        retrieved.                       - Diverticulosis in the sigmoid colon.                       - The examination was otherwise normal. Recommendation:       - Await pathology results. Manya Silvas, MD 05/08/2018 10:10:37 AM This report has been signed electronically. Number of Addenda: 0 Note Initiated On: 05/08/2018 9:27 AM Scope Withdrawal Time: 0 hours 15 minutes 54 seconds  Total Procedure Duration: 0 hours 26 minutes 52 seconds       [facility]

## 2018-05-08 NOTE — Anesthesia Postprocedure Evaluation (Signed)
Anesthesia Post Note  Patient: David [name].  Procedure(s) Performed: COLONOSCOPY WITH PROPOFOL (N/A )  Patient location during evaluation: PACU Anesthesia Type: General Level of consciousness: awake and alert Pain management: pain level controlled Vital Signs Assessment: post-procedure vital signs reviewed and stable Respiratory status: spontaneous breathing, nonlabored ventilation, respiratory function stable and patient connected to nasal cannula oxygen Cardiovascular status: blood pressure returned to baseline and stable Postop Assessment: no apparent nausea or vomiting Anesthetic complications: no     Last Vitals:  Vitals:   05/08/18 1019 05/08/18 1029  BP: 113/66 113/66  Pulse: 70 (!) 50  Resp: 19 15  Temp:    SpO2: 98% 98%    Last Pain:  Vitals:   05/08/18 1029  TempSrc:   PainSc: 0-No pain                 Molli Barrows

## 2018-05-08 NOTE — Anesthesia Preprocedure Evaluation (Signed)
Anesthesia Evaluation  Patient identified by MRN, date of birth, ID band Patient awake    Reviewed: Allergy & Precautions, H&P , NPO status , Patient's Chart, lab work & pertinent test results, reviewed documented beta blocker date and time   Airway Mallampati: II   Neck ROM: full    Dental  (+) Poor Dentition   Pulmonary sleep apnea , COPD, Current Smoker,    Pulmonary exam normal        Cardiovascular Exercise Tolerance: Poor negative cardio ROS Normal cardiovascular exam Rhythm:regular Rate:Normal     Neuro/Psych PSYCHIATRIC DISORDERS Depression negative neurological ROS     GI/Hepatic negative GI ROS, Neg liver ROS,   Endo/Other  negative endocrine ROSdiabetes  Renal/GU negative Renal ROS  negative genitourinary   Musculoskeletal   Abdominal   Peds  Hematology negative hematology ROS (+)   Anesthesia Other Findings Past Medical History: No date: Allergy No date: COPD (chronic obstructive pulmonary disease) (HCC)     Comment:  misdiagnosed No date: Depression No date: Diabetes mellitus without complication (HCC) No date: Hyperlipidemia No date: Microalbuminuria No date: Sleep apnea Past Surgical History: No date: broken right arm 2001: CHOLECYSTECTOMY 1998: [place]: TONSILLECTOMY AND ADENOIDECTOMY   Reproductive/Obstetrics negative OB ROS                             Anesthesia Physical Anesthesia Plan  ASA: III  Anesthesia Plan: General   Post-op Pain Management:    Induction:   PONV Risk Score and Plan:   Airway Management Planned:   Additional Equipment:   Intra-op Plan:   Post-operative Plan:   Informed Consent: I have reviewed the patients History and Physical, chart, labs and discussed the procedure including the risks, benefits and alternatives for the proposed anesthesia with the patient or authorized representative who has indicated  his/her understanding and acceptance.   Dental Advisory Given  Plan Discussed with: CRNA  Anesthesia Plan Comments:         Anesthesia Quick Evaluation

## 2018-05-11 LAB — SURGICAL PATHOLOGY

## 2018-07-26 ENCOUNTER — Encounter: Payer: Self-pay | Admitting: Family Medicine

## 2018-07-26 DIAGNOSIS — E669 Obesity, unspecified: Principal | ICD-10-CM

## 2018-07-26 DIAGNOSIS — E1169 Type 2 diabetes mellitus with other specified complication: Secondary | ICD-10-CM

## 2018-07-27 MED ORDER — LISINOPRIL 5 MG PO TABS: 5.0000 mg | ORAL_TABLET | Freq: Every day | ORAL | 0 refills | Status: DC

## 2018-07-27 MED ORDER — TAMSULOSIN HCL 0.4 MG PO CAPS: 0.4000 mg | ORAL_CAPSULE | Freq: Every day | ORAL | 0 refills | Status: DC

## 2018-07-27 MED ORDER — METFORMIN HCL 500 MG PO TABS: 500.0000 mg | ORAL_TABLET | Freq: Two times a day (BID) | ORAL | 0 refills | Status: DC

## 2018-07-27 MED ORDER — ATORVASTATIN CALCIUM 20 MG PO TABS: 20.0000 mg | ORAL_TABLET | Freq: Every day | ORAL | 0 refills | Status: DC

## 2018-08-26 DIAGNOSIS — S91331A Puncture wound without foreign body, right foot, initial encounter: Secondary | ICD-10-CM | POA: Diagnosis not present

## 2018-10-12 ENCOUNTER — Encounter: Payer: Self-pay | Admitting: Family Medicine

## 2018-10-15 NOTE — Progress Notes (Signed)
Patient: David [name]. Male    DOB: 21-Jun-1945   72 y.o.   MRN: [id number] Visit Date: 10/16/2018  Today's Provider: Lavon Paganini, MD   Chief Complaint  Patient presents with  . Diabetes  . Hyperlipidemia  . Hypertension   Subjective:    Virtual Visit via Telephone Note  I connected with [place] on 10/16/18 at  8:00 AM EDT by telephone and verified that I am speaking with the correct person using two identifiers.   Patient location: home Provider location: [place] involved in the visit: patient, provider    I discussed the limitations, risks, security and privacy concerns of performing an evaluation and management service by telephone and the availability of in person appointments. I also discussed with the patient that there may be a patient responsible charge related to this service. The patient expressed understanding and agreed to proceed.  HPI  Diabetes Mellitus Type II, Follow-up:   Lab Results  Component Value Date   HGBA1C 6.8 (A) 10/20/2017   HGBA1C 6.6 05/23/2017   HGBA1C 6.6 11/19/2016   Last seen for diabetes 1 years ago.  Management since then includes no changes. He reports good compliance with treatment. He is not having side effects.  Current symptoms include none  Home blood sugar records: fasting range: rarely being checked  Episodes of hypoglycemia? no   Current Insulin Regimen: none Most Recent Eye Exam: 02/05/2018 Weight trend: stable  Current diet: well balanced Current exercise: yard work  ------------------------------------------------------------------------   BP (no hypertension), follow-up:  BP Readings from Last 3 Encounters:  05/08/18 113/66  01/21/18 118/62  10/28/17 (!) 90/50    He was last seen for hypertension 6 months ago.  BP at that visit was 113/66. Management since that visit includes no changes.He reports good compliance with treatment. He is not having side  effects.  He is exercising. He is not adherent to low salt diet.   Outside blood pressures are not being checked at home. He is experiencing none.  Patient denies chest pain, chest pressure/discomfort, claudication, dyspnea, exertional chest pressure/discomfort, fatigue, irregular heart beat, lower extremity edema, near-syncope, orthopnea, palpitations, paroxysmal nocturnal dyspnea, syncope and tachypnea.   Cardiovascular risk factors include advanced age (older than 37 for men, 58 for women), diabetes mellitus, dyslipidemia, hypertension and male gender.  Use of agents associated with hypertension: none.   ------------------------------------------------------------------------   Lipid/Cholesterol, Follow-up:   Last seen for this 6 months ago.  Management since that visit includes no changes.  Last Lipid Panel:    Component Value Date/Time   CHOL 135 11/20/2016 0000   TRIG 228 (H) 11/20/2016 0000   HDL 31 (L) 11/20/2016 0000   CHOLHDL 4.4 11/20/2016 0000   LDLCALC 58 11/20/2016 0000    He reports good compliance with treatment. He is not having side effects.   Wt Readings from Last 3 Encounters:  05/08/18 206 lb (93.4 kg)  02/10/18 206 lb (93.4 kg)  01/21/18 215 lb (97.5 kg)   ------------------------------------------------------------------------  Allergies  Allergen Reactions  . Alcohol Other (See Comments)    Addiction     Current Outpatient Medications:  .  atorvastatin (LIPITOR) 20 MG tablet, Take 1 tablet (20 mg total) by mouth daily., Disp: 90 tablet, Rfl: 0 .  fluticasone (FLONASE) 50 MCG/ACT nasal spray, Place 2 sprays into both nostrils daily., Disp: 48 g, Rfl: 3 .  lisinopril (PRINIVIL,ZESTRIL) 5 MG tablet, Take 1 tablet (5 mg total)  by mouth daily., Disp: 90 tablet, Rfl: 0 .  loratadine (CLARITIN) 10 MG tablet, Take 10 mg by mouth daily. , Disp: , Rfl:  .  meloxicam (MOBIC) 15 MG tablet, Take 1 tablet (15 mg total) by mouth as needed., Disp: 90 tablet,  Rfl: 3 .  metFORMIN (GLUCOPHAGE) 500 MG tablet, Take 1 tablet (500 mg total) by mouth 2 (two) times daily with a meal., Disp: 180 tablet, Rfl: 0 .  sildenafil (VIAGRA) 50 MG tablet, Take 1 tablet (50 mg total) by mouth as needed., Disp: 8 tablet, Rfl: 5 .  tamsulosin (FLOMAX) 0.4 MG CAPS capsule, Take 1 capsule (0.4 mg total) by mouth daily., Disp: 90 capsule, Rfl: 0  Review of Systems  Constitutional: Negative.   Respiratory: Negative.   Cardiovascular: Negative.   Musculoskeletal: Negative.     Social History   Tobacco Use  . Smoking status: Current Every Day Smoker    Packs/day: 1.00    Years: 52.00    Pack years: 52.00    Types: Cigarettes    Last attempt to quit: 11/03/2013    Years since quitting: 4.9  . Smokeless tobacco: Never Used  . Tobacco comment: Pt started smoking again. Is smoking 1.25-1.5 PPD  Substance Use Topics  . Alcohol use: No    Comment: Previous Heavy Alcohol Abuse      Objective:   There were no vitals taken for this visit. There were no vitals filed for this visit.   Physical Exam      Assessment & Plan      I discussed the assessment and treatment plan with the patient. The patient was provided an opportunity to ask questions and all were answered. The patient agreed with the plan and demonstrated an understanding of the instructions.   The patient was advised to call back or seek an in-person evaluation if the symptoms worsen or if the condition fails to improve as anticipated.   Problem List Items Addressed This Visit      Respiratory   COPD, mild ([place])    Chronic and stable Not on any medications Asymptomatic Continue to monitor Continue to encourage tobacco cessation as below        Endocrine   Diabetes mellitus type 2 in obese ([place]) - Primary    Previously well controlled with last A1c 6.8 Continue metformin 500 mg nightly He is been unable to tolerate higher dose previously If needed in the future, could consider XR dosing  Discussed diet and exercise Up-to-date on screenings and vaccines On statin and ACE inhibitor Recheck A1c Follow-up in 6 months      Relevant Medications   atorvastatin (LIPITOR) 20 MG tablet   lisinopril (ZESTRIL) 5 MG tablet   metFORMIN (GLUCOPHAGE) 500 MG tablet   Other Relevant Orders   Hemoglobin A1c     Genitourinary   BPH (benign prostatic hyperplasia)    Chronic and stable Symptoms are well controlled Continue Flomax      Relevant Medications   tamsulosin (FLOMAX) 0.4 MG CAPS capsule     Other   Hypercholesteremia    Recheck fasting lipid panel and CMP Goal LDL less than 70 given diabetes Encouraged diet and exercise Continue statin      Relevant Medications   atorvastatin (LIPITOR) 20 MG tablet   lisinopril (ZESTRIL) 5 MG tablet   Other Relevant Orders   Lipid panel   Comprehensive metabolic panel   Microalbuminuria    Continue ACE inhibitor at low dose Check blood pressure  when he comes for lab work      Compulsive tobacco user syndrome    3 to 5-minute discussion regarding the importance of cessation and the harms of continuing smoking, especially given his other heart disease risk factors of hyperlipidemia and diabetes Patient remains pre-contemplative Encourage patient to reach out if he chooses to quit and that there are medications that can help him do this We will continue to reassess          Return in about 6 months (around 04/17/2019) for AWV/chronic disease f/u.   The entirety of the information documented in the History of Present Illness, Review of Systems and Physical Exam were personally obtained by me. Portions of this information were initially documented by Tiburcio Pea, CMA and reviewed by me for thoroughness and accuracy.    Driana Dazey, Dionne Bucy, MD MPH [facility]

## 2018-10-16 ENCOUNTER — Encounter: Payer: Self-pay | Admitting: Family Medicine

## 2018-10-16 ENCOUNTER — Ambulatory Visit (INDEPENDENT_AMBULATORY_CARE_PROVIDER_SITE_OTHER): Payer: Medicare Other | Admitting: Family Medicine

## 2018-10-16 DIAGNOSIS — E7800 Pure hypercholesterolemia, unspecified: Secondary | ICD-10-CM

## 2018-10-16 DIAGNOSIS — F17200 Nicotine dependence, unspecified, uncomplicated: Secondary | ICD-10-CM

## 2018-10-16 DIAGNOSIS — E669 Obesity, unspecified: Secondary | ICD-10-CM

## 2018-10-16 DIAGNOSIS — E1169 Type 2 diabetes mellitus with other specified complication: Secondary | ICD-10-CM

## 2018-10-16 DIAGNOSIS — N400 Enlarged prostate without lower urinary tract symptoms: Secondary | ICD-10-CM

## 2018-10-16 DIAGNOSIS — R809 Proteinuria, unspecified: Secondary | ICD-10-CM

## 2018-10-16 DIAGNOSIS — J449 Chronic obstructive pulmonary disease, unspecified: Secondary | ICD-10-CM | POA: Diagnosis not present

## 2018-10-16 MED ORDER — ATORVASTATIN CALCIUM 20 MG PO TABS: 20.0000 mg | ORAL_TABLET | Freq: Every day | ORAL | 1 refills | Status: DC

## 2018-10-16 MED ORDER — METFORMIN HCL 500 MG PO TABS: 500.0000 mg | ORAL_TABLET | Freq: Two times a day (BID) | ORAL | 1 refills | Status: DC

## 2018-10-16 MED ORDER — TAMSULOSIN HCL 0.4 MG PO CAPS: 0.4000 mg | ORAL_CAPSULE | Freq: Every day | ORAL | 1 refills | Status: DC

## 2018-10-16 MED ORDER — LISINOPRIL 5 MG PO TABS: 5.0000 mg | ORAL_TABLET | Freq: Every day | ORAL | 1 refills | Status: DC

## 2018-10-16 NOTE — Assessment & Plan Note (Signed)
3 to 5-minute discussion regarding the importance of cessation and the harms of continuing smoking, especially given his other heart disease risk factors of hyperlipidemia and diabetes Patient remains pre-contemplative Encourage patient to reach out if he chooses to quit and that there are medications that can help him do this We will continue to reassess

## 2018-10-16 NOTE — Assessment & Plan Note (Signed)
Chronic and stable Not on any medications Asymptomatic Continue to monitor Continue to encourage tobacco cessation as below

## 2018-10-16 NOTE — Assessment & Plan Note (Signed)
Previously well controlled with last A1c 6.8 Continue metformin 500 mg nightly He is been unable to tolerate higher dose previously If needed in the future, could consider XR dosing Discussed diet and exercise Up-to-date on screenings and vaccines On statin and ACE inhibitor Recheck A1c Follow-up in 6 months

## 2018-10-16 NOTE — Assessment & Plan Note (Signed)
Continue ACE inhibitor at low dose Check blood pressure when he comes for lab work

## 2018-10-16 NOTE — Assessment & Plan Note (Addendum)
Recheck fasting lipid panel and CMP Goal LDL less than 70 given diabetes Encouraged diet and exercise Continue statin

## 2018-10-16 NOTE — Assessment & Plan Note (Signed)
Chronic and stable Symptoms are well controlled Continue Flomax

## 2018-10-19 DIAGNOSIS — E7800 Pure hypercholesterolemia, unspecified: Secondary | ICD-10-CM | POA: Diagnosis not present

## 2018-10-19 DIAGNOSIS — E669 Obesity, unspecified: Secondary | ICD-10-CM | POA: Diagnosis not present

## 2018-10-19 DIAGNOSIS — E1169 Type 2 diabetes mellitus with other specified complication: Secondary | ICD-10-CM | POA: Diagnosis not present

## 2018-10-20 LAB — COMPREHENSIVE METABOLIC PANEL
ALT: 25 IU/L (ref 0–44)
AST: 19 IU/L (ref 0–40)
Albumin/Globulin Ratio: 1.9 (ref 1.2–2.2)
Albumin: 4.1 g/dL (ref 3.7–4.7)
Alkaline Phosphatase: 96 IU/L (ref 39–117)
BUN/Creatinine Ratio: 14 (ref 10–24)
BUN: 13 mg/dL (ref 8–27)
Bilirubin Total: 0.6 mg/dL (ref 0.0–1.2)
CO2: 22 mmol/L (ref 20–29)
Calcium: 9.4 mg/dL (ref 8.6–10.2)
Chloride: 105 mmol/L (ref 96–106)
Creatinine, Ser: 0.95 mg/dL (ref 0.76–1.27)
GFR calc Af Amer: 91 mL/min/{1.73_m2} (ref >59)
GFR calc non Af Amer: 79 mL/min/{1.73_m2} (ref >59)
Globulin, Total: 2.2 g/dL (ref 1.5–4.5)
Glucose: 129 mg/dL — ABNORMAL HIGH (ref 65–99)
Potassium: 4.6 mmol/L (ref 3.5–5.2)
Sodium: 142 mmol/L (ref 134–144)
Total Protein: 6.3 g/dL (ref 6.0–8.5)

## 2018-10-20 LAB — LIPID PANEL
Chol/HDL Ratio: 5.9 ratio — ABNORMAL HIGH (ref 0.0–5.0)
Cholesterol, Total: 176 mg/dL (ref 100–199)
HDL: 30 mg/dL — ABNORMAL LOW (ref >39)
LDL Calculated: 108 mg/dL — ABNORMAL HIGH (ref 0–99)
Triglycerides: 188 mg/dL — ABNORMAL HIGH (ref 0–149)
VLDL Cholesterol Cal: 38 mg/dL (ref 5–40)

## 2018-10-20 LAB — HEMOGLOBIN A1C
Est. average glucose Bld gHb Est-mCnc: 146 mg/dL
Hgb A1c MFr Bld: 6.7 % — ABNORMAL HIGH (ref 4.8–5.6)

## 2018-10-22 ENCOUNTER — Telehealth: Payer: Self-pay

## 2018-10-22 MED ORDER — ATORVASTATIN CALCIUM 40 MG PO TABS: 40.0000 mg | ORAL_TABLET | Freq: Every day | ORAL | 0 refills | Status: DC

## 2018-10-22 NOTE — Telephone Encounter (Signed)
-----   Message from Trinna Post, [place] sent at 10/22/2018 12:32 PM EDT ----- A1c controlled. CMET normal. Cholesterol not at goal. Recommend increasing Lipitor to 40 mg daily. If agreeable, please send in lipitor 40 mg QHS #90 with 0 refills for HLD. He can recheck labs at next follow up.

## 2018-10-22 NOTE — Telephone Encounter (Signed)
Patient advised as below. Patient verbalizes understanding and is in agreement with treatment plan.  

## 2018-12-01 DIAGNOSIS — L02224 Furuncle of groin: Secondary | ICD-10-CM | POA: Diagnosis not present

## 2018-12-14 DIAGNOSIS — S46212A Strain of muscle, fascia and tendon of other parts of biceps, left arm, initial encounter: Secondary | ICD-10-CM | POA: Diagnosis not present

## 2018-12-26 ENCOUNTER — Encounter: Payer: Self-pay | Admitting: Family Medicine

## 2018-12-28 MED ORDER — FLUTICASONE PROPIONATE 50 MCG/ACT NA SUSP: 2.0000 | Freq: Every day | NASAL | 3 refills | Status: DC

## 2019-01-14 ENCOUNTER — Other Ambulatory Visit: Payer: Self-pay | Admitting: Physician Assistant

## 2019-01-25 ENCOUNTER — Ambulatory Visit: Payer: Medicare Other

## 2019-02-01 ENCOUNTER — Ambulatory Visit: Payer: Medicare Other

## 2019-02-02 ENCOUNTER — Ambulatory Visit (INDEPENDENT_AMBULATORY_CARE_PROVIDER_SITE_OTHER): Payer: Medicare Other

## 2019-02-02 ENCOUNTER — Other Ambulatory Visit: Payer: Self-pay

## 2019-02-02 DIAGNOSIS — Z23 Encounter for immunization: Secondary | ICD-10-CM | POA: Diagnosis not present

## 2019-02-04 DIAGNOSIS — H40003 Preglaucoma, unspecified, bilateral: Secondary | ICD-10-CM | POA: Diagnosis not present

## 2019-02-04 LAB — HM DIABETES EYE EXAM

## 2019-02-09 ENCOUNTER — Telehealth: Payer: Self-pay | Admitting: *Deleted

## 2019-02-09 DIAGNOSIS — Z87891 Personal history of nicotine dependence: Secondary | ICD-10-CM

## 2019-02-09 DIAGNOSIS — Z122 Encounter for screening for malignant neoplasm of respiratory organs: Secondary | ICD-10-CM

## 2019-02-09 NOTE — Telephone Encounter (Signed)
Patient has been notified that annual lung cancer screening low dose CT scan is due currently or will be in near future. Confirmed that patient is within the age range of 55-77, and asymptomatic, (no signs or symptoms of lung cancer). Patient denies illness that would prevent curative treatment for lung cancer if found. Verified smoking history, (current, 53 pack year). The shared decision making visit was done 02/10/18. Patient is agreeable for CT scan being scheduled.

## 2019-02-11 ENCOUNTER — Other Ambulatory Visit: Payer: Self-pay

## 2019-02-11 ENCOUNTER — Ambulatory Visit
Admission: RE | Admit: 2019-02-11 | Discharge: 2019-02-11 | Disposition: A | Discharge status: Home / Self Care | Payer: Medicare Other | Source: Ambulatory Visit | Attending: Nurse Practitioner | Admitting: Nurse Practitioner

## 2019-02-11 DIAGNOSIS — F17210 Nicotine dependence, cigarettes, uncomplicated: Secondary | ICD-10-CM | POA: Diagnosis not present

## 2019-02-11 DIAGNOSIS — Z122 Encounter for screening for malignant neoplasm of respiratory organs: Secondary | ICD-10-CM | POA: Insufficient documentation

## 2019-02-11 DIAGNOSIS — Z87891 Personal history of nicotine dependence: Secondary | ICD-10-CM | POA: Diagnosis not present

## 2019-02-15 ENCOUNTER — Encounter: Payer: Self-pay | Admitting: *Deleted

## 2019-04-14 NOTE — Progress Notes (Signed)
Subjective:   David [name]. is a 73 y.o. male who presents for Medicare Annual/Subsequent preventive examination.    This visit is being conducted through telemedicine due to the COVID-19 pandemic. This patient has given me verbal consent via doximity to conduct this visit, patient states they are participating from their home address. Some vital signs may be absent or patient reported.    Patient identification: identified by name, DOB, and current address  Review of Systems:  N/A  Cardiac Risk Factors include: advanced age (>16men, >86 women);diabetes mellitus;dyslipidemia;hypertension;male gender     Objective:    Vitals: There were no vitals taken for this visit.  There is no height or weight on file to calculate BMI. Unable to obtain vitals due to visit being conducted via telephonically.   Advanced Directives 04/15/2019 05/08/2018 01/21/2018 03/23/2016 07/24/2015 07/06/2015 05/24/2015  Does Patient Have a Medical Advance Directive? Yes Yes Yes No Yes Yes Yes  Type of Paramedic of [place];Living will - [place];Living will - [place];Living will [place];Living will [place];Living will  Copy of [place] in Chart? No - copy requested - No - copy requested - - - -  Would patient like information on creating a medical advance directive? - - - No - patient declined information - - -    Tobacco Social History   Tobacco Use  Smoking Status Current Every Day Smoker  . Packs/day: 1.00  . Years: 52.00  . Pack years: 52.00  . Types: Cigarettes  . Last attempt to quit: 11/03/2013  . Years since quitting: 5.4  Smokeless Tobacco Never Used  Tobacco Comment   Pt started smoking again. Is smoking 1.25-1.5 PPD     Ready to quit: No Counseling given: No Comment: Pt started smoking again. Is smoking 1.25-1.5 PPD   Clinical Intake:  Pre-visit preparation  completed: Yes  Pain : No/denies pain Pain Score: 0-No pain     Nutritional Risks: None Diabetes: Yes  How often do you need to have someone help you when you read instructions, pamphlets, or other written materials from your doctor or pharmacy?: 1 - Never   Diabetes:  Is the patient diabetic?  Yes type 2 If diabetic, was a CBG obtained today?  No  Did the patient bring in their glucometer from home?  No  How often do you monitor your CBG's? Once every couple weeks.   Financial Strains and Diabetes Management:  Are you having any financial strains with the device, your supplies or your medication? No .  Does the patient want to be seen by Chronic Care Management for management of their diabetes?  No  Would the patient like to be referred to a Nutritionist or for Diabetic Management?  No   Diabetic Exams:  Diabetic Eye Exam: Completed 02/04/19. Repeat yearly.  Diabetic Foot Exam: Completed 10/20/17. Pt has been advised about the importance in completing this exam. Note made to follow up on this at the next in office apt.    Interpreter Needed?: No  Information entered by :: [facility], LPN  Past Medical History:  Diagnosis Date  . Allergy   . COPD (chronic obstructive pulmonary disease) ([place])    misdiagnosed  . Depression   . Diabetes mellitus without complication ([place])   . Hyperlipidemia   . Microalbuminuria   . Sleep apnea    Past Surgical History:  Procedure Laterality Date  . broken right  arm    . CHOLECYSTECTOMY  2001  . COLONOSCOPY WITH PROPOFOL N/A 05/08/2018   Procedure: COLONOSCOPY WITH PROPOFOL;  Surgeon: Manya Silvas, MD;  Location: [facility]COPY;  Service: Endoscopy;  Laterality: N/A;  . MELANOMA EXCISION  1998  . TONSILLECTOMY AND ADENOIDECTOMY  1953   Family History  Problem Relation Age of Onset  . Cervical cancer Mother   . Colon cancer Mother   . Colon polyps Brother   . Cervical cancer Maternal Grandmother   . Diabetes Maternal  Grandmother   . Tuberculosis Maternal Grandmother   . Lung cancer Maternal Grandfather   . Cervical cancer Maternal Aunt    Social History   Socioeconomic History  . Marital status: Married    Spouse name: Not on file  . Number of children: 2  . Years of education: College  . Highest education level: Bachelor's degree (e.g., BA, AB, BS)  Occupational History    Employer: [employer]    Comment: retired  Tobacco Use  . Smoking status: Current Every Day Smoker    Packs/day: 1.00    Years: 52.00    Pack years: 52.00    Types: Cigarettes    Last attempt to quit: 11/03/2013    Years since quitting: 5.4  . Smokeless tobacco: Never Used  . Tobacco comment: Pt started smoking again. Is smoking 1.25-1.5 PPD  Substance and Sexual Activity  . Alcohol use: No    Comment: Previous Heavy Alcohol Abuse  . Drug use: No  . Sexual activity: Not on file  Other Topics Concern  . Not on file  Social History Narrative  . Not on file   Social Determinants of Health   Financial Resource Strain: Low Risk   . Difficulty of Paying Living Expenses: Not hard at all  Food Insecurity: No Food Insecurity  . Worried About Charity fundraiser in the Last Year: Never true  . Ran Out of Food in the Last Year: Never true  Transportation Needs: No Transportation Needs  . Lack of Transportation (Medical): No  . Lack of Transportation (Non-Medical): No  Physical Activity:   . Days of Exercise per Week: Not on file  . Minutes of Exercise per Session: Not on file  Stress: Stress Concern Present  . Feeling of Stress : Rather much  Social Connections:   . Frequency of Communication with Friends and Family: Not on file  . Frequency of Social Gatherings with Friends and Family: Not on file  . Attends Religious Services: Not on file  . Active Member of Clubs or Organizations: Not on file  . Attends Archivist Meetings: Not on file  . Marital Status: Not on file    Outpatient Encounter Medications as  of 04/15/2019  Medication Sig  . atorvastatin (LIPITOR) 40 MG tablet TAKE 1 TABLET BY MOUTH EVERY DAY  . fluticasone (FLONASE) 50 MCG/ACT nasal spray Place 2 sprays into both nostrils daily.  Marland Kitchen lisinopril (ZESTRIL) 5 MG tablet Take 1 tablet (5 mg total) by mouth daily.  Marland Kitchen loratadine (CLARITIN) 10 MG tablet Take 10 mg by mouth daily.   . meloxicam (MOBIC) 15 MG tablet Take 1 tablet (15 mg total) by mouth as needed.  . metFORMIN (GLUCOPHAGE) 500 MG tablet Take 1 tablet (500 mg total) by mouth 2 (two) times daily with a meal. (Patient taking differently: Take 500 mg by mouth daily with breakfast. )  . sildenafil (VIAGRA) 50 MG tablet Take 1 tablet (50 mg total) by mouth as  needed.  . tamsulosin (FLOMAX) 0.4 MG CAPS capsule Take 1 capsule (0.4 mg total) by mouth daily.   No facility-administered encounter medications on file as of 04/15/2019.    Activities of Daily Living In your present state of health, do you have any difficulty performing the following activities: 04/15/2019  Hearing? N  Vision? N  Difficulty concentrating or making decisions? N  Walking or climbing stairs? N  Dressing or bathing? N  Doing errands, shopping? N  Preparing Food and eating ? N  Using the Toilet? N  In the past six months, have you accidently leaked urine? Y  Comment Currently on Flomax for this.  Do you have problems with loss of bowel control? N  Managing your Medications? N  Managing your Finances? N  Housekeeping or managing your Housekeeping? N  Some recent data might be hidden    Patient Care Team: Virginia Crews, MD as PCP - General (Family Medicine) Birder Robson, MD as Referring Physician (Ophthalmology) Ralene Bathe, MD (Dermatology)   Assessment:   This is a routine wellness examination for [employer].  Exercise Activities and Dietary recommendations Current Exercise Habits: The patient does not participate in regular exercise at present, Exercise limited by: None identified   Goals    . DIET - REDUCE CALORIE INTAKE     Recommend to continue current diet plan of cutting out sodas and junk food to help aid in weight loss.        Fall Risk Fall Risk  04/15/2019 01/21/2018 10/28/2017 05/24/2015 10/21/2014  Falls in the past year? 0 No No No No  Number falls in past yr: 0 - - - -  Injury with Fall? 0 - - - -   FALL RISK PREVENTION PERTAINING TO THE HOME:  Any stairs in or around the home? Yes  If so, are there any without handrails? No   Home free of loose throw rugs in walkways, pet beds, electrical cords, etc? Yes  Adequate lighting in your home to reduce risk of falls? Yes   ASSISTIVE DEVICES UTILIZED TO PREVENT FALLS:  Life alert? No  Use of a cane, walker or w/c? No  Grab bars in the bathroom? Yes  Shower chair or bench in shower? No  Elevated toilet seat or a handicapped toilet? No    TIMED UP AND GO:  Was the test performed? No .    Depression Screen PHQ 2/9 Scores 04/15/2019 01/21/2018 05/24/2015 10/21/2014  PHQ - 2 Score 0 1 0 0  PHQ- 9 Score - 1 - -    Cognitive Function     6CIT Screen 04/15/2019 01/21/2018  What Year? 0 points 0 points  What month? 0 points 0 points  What time? 0 points 0 points  Count back from 20 0 points 0 points  Months in reverse 0 points 0 points  Repeat phrase 0 points 0 points  Total Score 0 0    Immunization History  Administered Date(s) Administered  . Fluad Quad(high Dose 65+) 02/02/2019  . Influenza Split 02/27/2012  . Influenza, High Dose Seasonal PF 02/19/2016, 01/15/2017, 01/21/2018  . Influenza,inj,Quad PF,6+ Mos 02/18/2014  . Influenza-Unspecified 03/07/2015  . Pneumococcal Conjugate-13 06/08/2014  . Pneumococcal Polysaccharide-23 12/16/2011  . Td 09/11/2004  . Zoster 02/27/2012    Qualifies for Shingles Vaccine? Yes  Zostavax completed 02/27/12. Due for Shingrix. Pt has been advised to call insurance company to determine out of pocket expense. Advised may also receive vaccine at local  pharmacy or Health  Dept. Verbalized acceptance and understanding.  Tdap: Although this vaccine is not a covered service during a Wellness Exam, does the patient still wish to receive this vaccine today?  No .   Flu Vaccine: Up to date  Pneumococcal Vaccine: Completed series  Screening Tests Health Maintenance  Topic Date Due  . FOOT EXAM  10/21/2018  . TETANUS/TDAP  02/10/2023 (Originally 09/12/2014)  . HEMOGLOBIN A1C  04/20/2019  . OPHTHALMOLOGY EXAM  02/04/2020  . COLONOSCOPY  05/08/2021  . INFLUENZA VACCINE  Completed  . Hepatitis C Screening  Completed  . PNA vac Low Risk Adult  Completed   Cancer Screenings:  Colorectal Screening: Completed 05/08/18. Repeat every 3-5 years.  Lung Cancer Screening: (Low Dose CT Chest recommended if Age 81-80 years, 30 pack-year currently smoking OR have quit w/in 15years.) does qualify however had this completed 02/12/19. Repeat yearly.  Additional Screening:  Hepatitis C Screening: Up to date  Dental Screening: Recommended annual dental exams for proper oral hygiene  Community Resource Referral:  CRR required this visit?  No        Plan:  I have personally reviewed and addressed the Medicare Annual Wellness questionnaire and have noted the following in the patient's chart:  A. Medical and social history B. Use of alcohol, tobacco or illicit drugs  C. Current medications and supplements D. Functional ability and status E.  Nutritional status F.  Physical activity G. Advance directives H. List of other physicians I.  Hospitalizations, surgeries, and ER visits in previous 12 months J.  [place] such as hearing and vision if needed, cognitive and depression L. Referrals and appointments   In addition, I have reviewed and discussed with patient certain preventive protocols, quality metrics, and best practice recommendations. A written personalized care plan for preventive services as well as general preventive health  recommendations were provided to patient.   Glendora Score, [place]  [phone] Nurse Health Advisor  Nurse Notes: Pt needs a diabetic foot exam at next in office visit.

## 2019-04-15 ENCOUNTER — Other Ambulatory Visit: Payer: Self-pay

## 2019-04-15 ENCOUNTER — Ambulatory Visit (INDEPENDENT_AMBULATORY_CARE_PROVIDER_SITE_OTHER): Payer: Medicare Other

## 2019-04-15 DIAGNOSIS — Z0000 Encounter for general adult medical examination without abnormal findings: Secondary | ICD-10-CM | POA: Diagnosis not present

## 2019-04-15 NOTE — Patient Instructions (Signed)
David [name] , Thank you for taking time to come for your Medicare Wellness Visit. I appreciate your ongoing commitment to your health goals. Please review the following plan we discussed and let me know if I can assist you in the future.   Screening recommendations/referrals: Colonoscopy: Up to date, due 05/2021 Recommended yearly ophthalmology/optometry visit for glaucoma screening and checkup Recommended yearly dental visit for hygiene and checkup  Vaccinations: Influenza vaccine: Up to date Pneumococcal vaccine: Completed series Tdap vaccine: Pt declines today.  Shingles vaccine: Pt declines today.     Advanced directives: Please bring a copy of your POA (Power of Attorney) and/or Living Will to your next appointment.   Conditions/risks identified: Recommend to continue to cut back on high sugar sweets. Smoking cessation discussed today.   Next appointment: 04/20/19 @ 10:40 AM with Dr Brita Romp  Preventive Care 74 Years and Older, Male Preventive care refers to lifestyle choices and visits with your health care provider that can promote health and wellness. What does preventive care include?  A yearly physical exam. This is also called an annual well check.  Dental exams once or twice a year.  Routine eye exams. Ask your health care provider how often you should have your eyes checked.  Personal lifestyle choices, including:  Daily care of your teeth and gums.  Regular physical activity.  Eating a healthy diet.  Avoiding tobacco and drug use.  Limiting alcohol use.  Practicing safe sex.  Taking low doses of aspirin every day.  Taking vitamin and mineral supplements as recommended by your health care provider. What happens during an annual well check? The services and screenings done by your health care provider during your annual well check will depend on your age, overall health, lifestyle risk factors, and family history of disease. Counseling  Your health care  provider may ask you questions about your:  Alcohol use.  Tobacco use.  Drug use.  Emotional well-being.  Home and relationship well-being.  Sexual activity.  Eating habits.  History of falls.  Memory and ability to understand (cognition).  Work and work Statistician. Screening  You may have the following tests or measurements:  Height, weight, and BMI.  Blood pressure.  Lipid and cholesterol levels. These may be checked every 5 years, or more frequently if you are over 64 years old.  Skin check.  Lung cancer screening. You may have this screening every year starting at age 70 if you have a 30-pack-year history of smoking and currently smoke or have quit within the past 15 years.  Fecal occult blood test (FOBT) of the stool. You may have this test every year starting at age 51.  Flexible sigmoidoscopy or colonoscopy. You may have a sigmoidoscopy every 5 years or a colonoscopy every 10 years starting at age 28.  Prostate cancer screening. Recommendations will vary depending on your family history and other risks.  Hepatitis C blood test.  Hepatitis B blood test.  Sexually transmitted disease (STD) testing.  Diabetes screening. This is done by checking your blood sugar (glucose) after you have not eaten for a while (fasting). You may have this done every 1-3 years.  Abdominal aortic aneurysm (AAA) screening. You may need this if you are a current or former smoker.  Osteoporosis. You may be screened starting at age 48 if you are at high risk. Talk with your health care provider about your test results, treatment options, and if necessary, the need for more tests. Vaccines  Your health care provider  may recommend certain vaccines, such as:  Influenza vaccine. This is recommended every year.  Tetanus, diphtheria, and acellular pertussis (Tdap, Td) vaccine. You may need a Td booster every 10 years.  Zoster vaccine. You may need this after age 33.  Pneumococcal  13-valent conjugate (PCV13) vaccine. One dose is recommended after age 80.  Pneumococcal polysaccharide (PPSV23) vaccine. One dose is recommended after age 73. Talk to your health care provider about which screenings and vaccines you need and how often you need them. This information is not intended to replace advice given to you by your health care provider. Make sure you discuss any questions you have with your health care provider. Document Released: 05/19/2015 Document Revised: 01/10/2016 Document Reviewed: 02/21/2015 Elsevier Interactive Patient Education  2017 [place] Prevention in the Home Falls can cause injuries. They can happen to people of all ages. There are many things you can do to make your home safe and to help prevent falls. What can I do on the outside of my home?  Regularly fix the edges of walkways and driveways and fix any cracks.  Remove anything that might make you trip as you walk through a door, such as a raised step or threshold.  Trim any bushes or trees on the path to your home.  Use bright outdoor lighting.  Clear any walking paths of anything that might make someone trip, such as rocks or tools.  Regularly check to see if handrails are loose or broken. Make sure that both sides of any steps have handrails.  Any raised decks and porches should have guardrails on the edges.  Have any leaves, snow, or ice cleared regularly.  Use sand or salt on walking paths during winter.  Clean up any spills in your garage right away. This includes oil or grease spills. What can I do in the bathroom?  Use night lights.  Install grab bars by the toilet and in the tub and shower. Do not use towel bars as grab bars.  Use non-skid mats or decals in the tub or shower.  If you need to sit down in the shower, use a plastic, non-slip stool.  Keep the floor dry. Clean up any water that spills on the floor as soon as it happens.  Remove soap buildup in the  tub or shower regularly.  Attach bath mats securely with double-sided non-slip rug tape.  Do not have throw rugs and other things on the floor that can make you trip. What can I do in the bedroom?  Use night lights.  Make sure that you have a light by your bed that is easy to reach.  Do not use any sheets or blankets that are too big for your bed. They should not hang down onto the floor.  Have a firm chair that has side arms. You can use this for support while you get dressed.  Do not have throw rugs and other things on the floor that can make you trip. What can I do in the kitchen?  Clean up any spills right away.  Avoid walking on wet floors.  Keep items that you use a lot in easy-to-reach places.  If you need to reach something above you, use a strong step stool that has a grab bar.  Keep electrical cords out of the way.  Do not use floor polish or wax that makes floors slippery. If you must use wax, use non-skid floor wax.  Do not have throw rugs  and other things on the floor that can make you trip. What can I do with my stairs?  Do not leave any items on the stairs.  Make sure that there are handrails on both sides of the stairs and use them. Fix handrails that are broken or loose. Make sure that handrails are as long as the stairways.  Check any carpeting to make sure that it is firmly attached to the stairs. Fix any carpet that is loose or worn.  Avoid having throw rugs at the top or bottom of the stairs. If you do have throw rugs, attach them to the floor with carpet tape.  Make sure that you have a light switch at the top of the stairs and the bottom of the stairs. If you do not have them, ask someone to add them for you. What else can I do to help prevent falls?  Wear shoes that:  Do not have high heels.  Have rubber bottoms.  Are comfortable and fit you well.  Are closed at the toe. Do not wear sandals.  If you use a stepladder:  Make sure that it  is fully opened. Do not climb a closed stepladder.  Make sure that both sides of the stepladder are locked into place.  Ask someone to hold it for you, if possible.  Clearly mark and make sure that you can see:  Any grab bars or handrails.  First and last steps.  Where the edge of each step is.  Use tools that help you move around (mobility aids) if they are needed. These include:  Canes.  Walkers.  Scooters.  Crutches.  Turn on the lights when you go into a dark area. Replace any light bulbs as soon as they burn out.  Set up your furniture so you have a clear path. Avoid moving your furniture around.  If any of your floors are uneven, fix them.  If there are any pets around you, be aware of where they are.  Review your medicines with your doctor. Some medicines can make you feel dizzy. This can increase your chance of falling. Ask your doctor what other things that you can do to help prevent falls. This information is not intended to replace advice given to you by your health care provider. Make sure you discuss any questions you have with your health care provider. Document Released: 02/16/2009 Document Revised: 09/28/2015 Document Reviewed: 05/27/2014 Elsevier Interactive Patient Education  2017 [employer].

## 2019-04-20 ENCOUNTER — Ambulatory Visit (INDEPENDENT_AMBULATORY_CARE_PROVIDER_SITE_OTHER): Payer: Medicare Other | Admitting: Family Medicine

## 2019-04-20 ENCOUNTER — Other Ambulatory Visit: Payer: Self-pay

## 2019-04-20 ENCOUNTER — Telehealth: Payer: Self-pay

## 2019-04-20 ENCOUNTER — Ambulatory Visit: Payer: Medicare Other | Admitting: Family Medicine

## 2019-04-20 ENCOUNTER — Encounter: Payer: Self-pay | Admitting: Family Medicine

## 2019-04-20 ENCOUNTER — Ambulatory Visit: Payer: Medicare Other

## 2019-04-20 VITALS — BP 119/59 | HR 75 | Temp 97.1°F | Resp 16 | Ht 67.0 in | Wt 222.2 lb

## 2019-04-20 DIAGNOSIS — F17200 Nicotine dependence, unspecified, uncomplicated: Secondary | ICD-10-CM

## 2019-04-20 DIAGNOSIS — E1169 Type 2 diabetes mellitus with other specified complication: Secondary | ICD-10-CM

## 2019-04-20 DIAGNOSIS — R809 Proteinuria, unspecified: Secondary | ICD-10-CM | POA: Diagnosis not present

## 2019-04-20 DIAGNOSIS — J449 Chronic obstructive pulmonary disease, unspecified: Secondary | ICD-10-CM

## 2019-04-20 DIAGNOSIS — E785 Hyperlipidemia, unspecified: Secondary | ICD-10-CM | POA: Diagnosis not present

## 2019-04-20 DIAGNOSIS — E1129 Type 2 diabetes mellitus with other diabetic kidney complication: Secondary | ICD-10-CM | POA: Diagnosis not present

## 2019-04-20 DIAGNOSIS — E669 Obesity, unspecified: Secondary | ICD-10-CM

## 2019-04-20 DIAGNOSIS — Z6834 Body mass index (BMI) 34.0-34.9, adult: Secondary | ICD-10-CM | POA: Diagnosis not present

## 2019-04-20 LAB — POCT GLYCOSYLATED HEMOGLOBIN (HGB A1C)
Est. average glucose Bld gHb Est-mCnc: 143
Hemoglobin A1C: 6.7 % — AB (ref 4.0–5.6)

## 2019-04-20 NOTE — Assessment & Plan Note (Signed)
Discussed importance of healthy weight management Discussed diet and exercise  

## 2019-04-20 NOTE — Telephone Encounter (Signed)
Patient review result on mychart

## 2019-04-20 NOTE — Telephone Encounter (Signed)
Copied from [name] [phone]. Topic: General - Inquiry >> Apr 20, 2019  1:27 PM David [name] wrote: Reason for CRM:   Pt calling to get his A1C results. Pt can be reached at [phone]

## 2019-04-20 NOTE — Assessment & Plan Note (Signed)
Chronic and stable Not on any meds Encouraged tobacco cessation as below

## 2019-04-20 NOTE — Assessment & Plan Note (Signed)
Chronic and stable Last LDL not to goal of less than 70 given diabetes Recheck FLP and CMP Continue atorvastatin 40 mg daily Encourage diet and exercise

## 2019-04-20 NOTE — Patient Instructions (Addendum)
 The CDC recommends two doses of Shingrix (the shingles vaccine) separated by 2 to 6 months for adults age 74 years and older. I recommend checking with your insurance plan regarding coverage for this vaccine.    Preventive Care 65 Years and Older, Male Preventive care refers to lifestyle choices and visits with your health care provider that can promote health and wellness. This includes:  A yearly physical exam. This is also called an annual well check.  Regular dental and eye exams.  Immunizations.  Screening for certain conditions.  Healthy lifestyle choices, such as diet and exercise. What can I expect for my preventive care visit? Physical exam Your health care provider will check:  Height and weight. These may be used to calculate body mass index (BMI), which is a measurement that tells if you are at a healthy weight.  Heart rate and blood pressure.  Your skin for abnormal spots. Counseling Your health care provider may ask you questions about:  Alcohol, tobacco, and drug use.  Emotional well-being.  Home and relationship well-being.  Sexual activity.  Eating habits.  History of falls.  Memory and ability to understand (cognition).  Work and work environment. What immunizations do I need?  Influenza (flu) vaccine  This is recommended every year. Tetanus, diphtheria, and pertussis (Tdap) vaccine  You may need a Td booster every 10 years. Varicella (chickenpox) vaccine  You may need this vaccine if you have not already been vaccinated. Zoster (shingles) vaccine  You may need this after age 60. Pneumococcal conjugate (PCV13) vaccine  One dose is recommended after age 65. Pneumococcal polysaccharide (PPSV23) vaccine  One dose is recommended after age 65. Measles, mumps, and rubella (MMR) vaccine  You may need at least one dose of MMR if you were born in 1957 or later. You may also need a second dose. Meningococcal conjugate (MenACWY) vaccine  You  may need this if you have certain conditions. Hepatitis A vaccine  You may need this if you have certain conditions or if you travel or work in places where you may be exposed to hepatitis A. Hepatitis B vaccine  You may need this if you have certain conditions or if you travel or work in places where you may be exposed to hepatitis B. Haemophilus influenzae type b (Hib) vaccine  You may need this if you have certain conditions. You may receive vaccines as individual doses or as more than one vaccine together in one shot (combination vaccines). Talk with your health care provider about the risks and benefits of combination vaccines. What tests do I need? Blood tests  Lipid and cholesterol levels. These may be checked every 5 years, or more frequently depending on your overall health.  Hepatitis C test.  Hepatitis B test. Screening  Lung cancer screening. You may have this screening every year starting at age 55 if you have a 30-pack-year history of smoking and currently smoke or have quit within the past 15 years.  Colorectal cancer screening. All adults should have this screening starting at age 74 and continuing until age 75. Your health care provider may recommend screening at age 45 if you are at increased risk. You will have tests every 1-10 years, depending on your results and the type of screening test.  Prostate cancer screening. Recommendations will vary depending on your family history and other risks.  Diabetes screening. This is done by checking your blood sugar (glucose) after you have not eaten for a while (fasting). You may have   this done every 1-3 years.  Abdominal aortic aneurysm (AAA) screening. You may need this if you are a current or former smoker.  Sexually transmitted disease (STD) testing. Follow these instructions at home: Eating and drinking  Eat a diet that includes fresh fruits and vegetables, whole grains, lean protein, and low-fat dairy products. Limit  your intake of foods with high amounts of sugar, saturated fats, and salt.  Take vitamin and mineral supplements as recommended by your health care provider.  Do not drink alcohol if your health care provider tells you not to drink.  If you drink alcohol: ? Limit how much you have to 0-2 drinks a day. ? Be aware of how much alcohol is in your drink. In the [place] one drink equals one 12 oz bottle of beer (355 mL), one 5 oz glass of wine (148 mL), or one 1 oz glass of hard liquor (44 mL). Lifestyle  Take daily care of your teeth and gums.  Stay active. Exercise for at least 30 minutes on 5 or more days each week.  Do not use any products that contain nicotine or tobacco, such as cigarettes, e-cigarettes, and chewing tobacco. If you need help quitting, ask your health care provider.  If you are sexually active, practice safe sex. Use a condom or other form of protection to prevent STIs (sexually transmitted infections).  Talk with your health care provider about taking a low-dose aspirin or statin. What's next?  Visit your health care provider once a year for a well check visit.  Ask your health care provider how often you should have your eyes and teeth checked.  Stay up to date on all vaccines. This information is not intended to replace advice given to you by your health care provider. Make sure you discuss any questions you have with your health care provider. Document Released: 05/19/2015 Document Revised: 04/16/2018 Document Reviewed: 04/16/2018 Elsevier Patient Education  2020 [employer]  

## 2019-04-20 NOTE — Assessment & Plan Note (Signed)
Chronic and well-controlled Continue metformin 500 mg nightly Says she can get he has been unable to tolerate higher dose previously Discussed diet and exercise Up-to-date on screenings and vaccines Foot exam completed today On statin and ACE inhibitor Follow-up in 6 months

## 2019-04-20 NOTE — Assessment & Plan Note (Signed)
Continue low-dose ACE inhibitor

## 2019-04-20 NOTE — Assessment & Plan Note (Addendum)
Again discussed the importance of cessation and the harms of continuing smoking, especially given his other heart disease risk factors of HLD and T2DM Patient remains precontemplative Encourage patient to reach out if he chooses to quit and that there are medications that can help with itchiness We will continue to reassess Up-to-date on lung cancer screening

## 2019-04-20 NOTE — Progress Notes (Signed)
Patient: David [name]. Male    DOB: 11/30/1945   73 y.o.   MRN: [id number] Visit Date: 04/20/2019  Today's Provider: Lavon Paganini, MD   Chief Complaint  Patient presents with  . Diabetes  . Hyperlipidemia  . COPD   Subjective:    I [place] S. Dimas, CMA, am acting as scribe for Lavon Paganini, MD.  04/15/2019 AWV with [facility]  HPI  Diabetes Mellitus Type II, Follow-up:   Lab Results  Component Value Date   HGBA1C 6.7 (A) 04/20/2019   HGBA1C 6.7 (H) 10/19/2018   HGBA1C 6.8 (A) 10/20/2017    Last seen for diabetes 6 months ago.  Management since then includes no changes. He reports excellent compliance with treatment. He is not having side effects.  Current symptoms include none and have been stable. Home blood sugar records: fasting range: 150's  Episodes of hypoglycemia? no   Current insulin regiment: Is not on insulin Most Recent Eye Exam: UTD Weight trend: increasing steadily Prior visit with dietician: No Current exercise: housecleaning, no regular exercise and yard work Current diet habits: in general, a "healthy" diet    Pertinent Labs:    Component Value Date/Time   CHOL 176 10/19/2018 1106   TRIG 188 (H) 10/19/2018 1106   HDL 30 (L) 10/19/2018 1106   LDLCALC 108 (H) 10/19/2018 1106   CREATININE 0.95 10/19/2018 1106   CREATININE 0.81 04/01/2012 0603    Wt Readings from Last 3 Encounters:  04/20/19 222 lb 3.2 oz (100.8 kg)  02/11/19 206 lb (93.4 kg)  05/08/18 206 lb (93.4 kg)  ------------------------------------------------------------------------   Lipid/Cholesterol, Follow-up:   Last seen for this6 months ago.  Management changes since that visit include no changes.  Last Lipid Panel:    Component Value Date/Time   CHOL 176 10/19/2018 1106   TRIG 188 (H) 10/19/2018 1106   HDL 30 (L) 10/19/2018 1106   CHOLHDL 5.9 (H) 10/19/2018 1106   LDLCALC 108 (H) 10/19/2018 1106    Risk factors for vascular disease include  diabetes mellitus and hypercholesterolemia  He reports excellent compliance with treatment. He is not having side effects.  Current symptoms include none and have been stable. Weight trend: increasing steadily Prior visit with dietician: no Current diet: in general, a "healthy" diet   Current exercise: housecleaning, no regular exercise and yard work  [facility] Readings from Last 3 Encounters:  04/20/19 222 lb 3.2 oz (100.8 kg)  02/11/19 206 lb (93.4 kg)  05/08/18 206 lb (93.4 kg)    -------------------------------------------------------------------   COPD, Follow up:  He was last seen for this 6 months ago. Changes made include no changes.   He reports excellent compliance with treatment. He is not having side effects.  he is/isnot using rescue inhaler. Typically 0 per months. He IS experiencing none. He is NOT experiencing dyspnea, cough or wheezing. he report breathing is Improved.  ------------------------------------------------------------------------   Allergies  Allergen Reactions  . Alcohol Other (See Comments)    Addiction     Current Outpatient Medications:  .  atorvastatin (LIPITOR) 40 MG tablet, TAKE 1 TABLET BY MOUTH EVERY DAY, Disp: 90 tablet, Rfl: 0 .  fluticasone (FLONASE) 50 MCG/ACT nasal spray, Place 2 sprays into both nostrils daily., Disp: 48 g, Rfl: 3 .  lisinopril (ZESTRIL) 5 MG tablet, Take 1 tablet (5 mg total) by mouth daily., Disp: 90 tablet, Rfl: 1 .  loratadine (CLARITIN) 10 MG tablet, Take 10 mg by mouth daily. , Disp: ,  Rfl:  .  meloxicam (MOBIC) 15 MG tablet, Take 1 tablet (15 mg total) by mouth as needed., Disp: 90 tablet, Rfl: 3 .  metFORMIN (GLUCOPHAGE) 500 MG tablet, Take 1 tablet (500 mg total) by mouth 2 (two) times daily with a meal. (Patient taking differently: Take 500 mg by mouth daily with breakfast. ), Disp: 180 tablet, Rfl: 1 .  sildenafil (VIAGRA) 50 MG tablet, Take 1 tablet (50 mg total) by mouth as needed., Disp: 8 tablet, Rfl:  5 .  tamsulosin (FLOMAX) 0.4 MG CAPS capsule, Take 1 capsule (0.4 mg total) by mouth daily., Disp: 90 capsule, Rfl: 1  Review of Systems  Constitutional: Negative.   HENT: Negative.   Eyes: Negative.   Respiratory: Negative.   Cardiovascular: Negative.   Gastrointestinal: Negative.   Endocrine: Negative.   Genitourinary: Negative.   Musculoskeletal: Negative.   Skin: Negative.   Allergic/Immunologic: Negative.   Neurological: Negative.   Hematological: Negative.   Psychiatric/Behavioral: Negative.     Social History   Tobacco Use  . Smoking status: Current Every Day Smoker    Packs/day: 1.00    Years: 52.00    Pack years: 52.00    Types: Cigarettes    Last attempt to quit: 11/03/2013    Years since quitting: 5.4  . Smokeless tobacco: Never Used  . Tobacco comment: Pt started smoking again. Is smoking 1.25-1.5 PPD  Substance Use Topics  . Alcohol use: No    Comment: Previous Heavy Alcohol Abuse      Objective:   BP (!) 119/59 (BP Location: Left Arm, Patient Position: Sitting, Cuff Size: Large)   Pulse 75   Temp (!) 97.1 F (36.2 C) (Temporal)   Resp 16   Ht 5\' 7"  (1.702 m)   Wt 222 lb 3.2 oz (100.8 kg)   SpO2 97%   BMI 34.80 kg/m  Vitals:   04/20/19 1046  BP: (!) 119/59  Pulse: 75  Resp: 16  Temp: (!) 97.1 F (36.2 C)  TempSrc: Temporal  SpO2: 97%  Weight: 222 lb 3.2 oz (100.8 kg)  Height: 5\' 7"  (1.702 m)  Body mass index is 34.8 kg/m.   Physical Exam Vitals reviewed.  Constitutional:      General: He is not in acute distress.    Appearance: Normal appearance. He is not diaphoretic.  HENT:     Head: Normocephalic and atraumatic.  Eyes:     General: No scleral icterus.    Conjunctiva/sclera: Conjunctivae normal.  Cardiovascular:     Rate and Rhythm: Normal rate and regular rhythm.     Pulses: Normal pulses.     Heart sounds: Normal heart sounds. No murmur.  Pulmonary:     Effort: Pulmonary effort is normal. No respiratory distress.      Breath sounds: Normal breath sounds. No wheezing or rhonchi.  Abdominal:     General: There is no distension.     Palpations: Abdomen is soft.     Tenderness: There is no abdominal tenderness.  Musculoskeletal:     Cervical back: Neck supple.     Right lower leg: No edema.     Left lower leg: No edema.  Lymphadenopathy:     Cervical: No cervical adenopathy.  Skin:    General: Skin is warm and dry.     Capillary Refill: Capillary refill takes less than 2 seconds.     Findings: No rash.  Neurological:     Mental Status: He is alert and oriented to person, place, and  time.     Cranial Nerves: No cranial nerve deficit.  Psychiatric:        Mood and Affect: Mood normal.        Behavior: Behavior normal.     Results for orders placed or performed in visit on 04/20/19  POCT HgB A1C  Result Value Ref Range   Hemoglobin A1C 6.7 (A) 4.0 - 5.6 %   Est. 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 Not on any meds Encouraged tobacco cessation as below        Endocrine   Diabetes mellitus type 2 in obese (HCC) - Primary    Chronic and well-controlled Continue metformin 500 mg nightly Says she can get he has been unable to tolerate higher dose previously Discussed diet and exercise Up-to-date on screenings and vaccines Foot exam completed today On statin and ACE inhibitor Follow-up in 6 months      Hyperlipidemia associated with type 2 diabetes mellitus (HCC)    Chronic and stable Last LDL not to goal of less than 70 given diabetes Recheck FLP and CMP Continue atorvastatin 40 mg daily Encourage diet and exercise      Relevant Orders   Lipid panel   CMP (Comprehensive metabolic panel)   Microalbuminuria due to type 2 diabetes mellitus (HCC)    Continue low-dose ACE inhibitor        Other   Obesity    Discussed importance of healthy weight  management Discussed diet and exercise       Compulsive tobacco user syndrome    Again discussed the importance of cessation and the harms of continuing smoking, especially given his other heart disease risk factors of HLD and T2DM Patient remains precontemplative Encourage patient to reach out if he chooses to quit and that there are medications that can help with itchiness We will continue to reassess Up-to-date on lung cancer screening          Return in about 6 months (around 10/19/2019) for chronic disease f/u.   The entirety of the information documented in the History of Present Illness, Review of Systems and Physical Exam were personally obtained by me. Portions of this information were initially documented by Lynford Humphrey, CMA and reviewed by me for thoroughness and accuracy.    Kush Farabee, Dionne Bucy, MD MPH [facility]

## 2019-04-22 ENCOUNTER — Other Ambulatory Visit: Payer: Self-pay | Admitting: Family Medicine

## 2019-04-22 ENCOUNTER — Telehealth: Payer: Self-pay

## 2019-04-22 DIAGNOSIS — E669 Obesity, unspecified: Secondary | ICD-10-CM

## 2019-04-22 DIAGNOSIS — E1169 Type 2 diabetes mellitus with other specified complication: Secondary | ICD-10-CM

## 2019-04-22 LAB — COMPREHENSIVE METABOLIC PANEL
ALT: 30 IU/L (ref 0–44)
AST: 22 IU/L (ref 0–40)
Albumin/Globulin Ratio: 2.2 (ref 1.2–2.2)
Albumin: 4.3 g/dL (ref 3.7–4.7)
Alkaline Phosphatase: 96 IU/L (ref 39–117)
BUN/Creatinine Ratio: 10 (ref 10–24)
BUN: 10 mg/dL (ref 8–27)
Bilirubin Total: 0.7 mg/dL (ref 0.0–1.2)
CO2: 25 mmol/L (ref 20–29)
Calcium: 9.4 mg/dL (ref 8.6–10.2)
Chloride: 104 mmol/L (ref 96–106)
Creatinine, Ser: 0.98 mg/dL (ref 0.76–1.27)
GFR calc Af Amer: 87 mL/min/{1.73_m2} (ref >59)
GFR calc non Af Amer: 76 mL/min/{1.73_m2} (ref >59)
Globulin, Total: 2 g/dL (ref 1.5–4.5)
Glucose: 154 mg/dL — ABNORMAL HIGH (ref 65–99)
Potassium: 4.6 mmol/L (ref 3.5–5.2)
Sodium: 143 mmol/L (ref 134–144)
Total Protein: 6.3 g/dL (ref 6.0–8.5)

## 2019-04-22 LAB — LIPID PANEL
Chol/HDL Ratio: 4.3 ratio (ref 0.0–5.0)
Cholesterol, Total: 130 mg/dL (ref 100–199)
HDL: 30 mg/dL — ABNORMAL LOW (ref >39)
LDL Chol Calc (NIH): 60 mg/dL (ref 0–99)
Triglycerides: 248 mg/dL — ABNORMAL HIGH (ref 0–149)
VLDL Cholesterol Cal: 40 mg/dL (ref 5–40)

## 2019-04-22 NOTE — Telephone Encounter (Signed)
-----   Message from Virginia Crews, MD sent at 04/22/2019  8:49 AM EST ----- Normal labs

## 2019-04-22 NOTE — Telephone Encounter (Signed)
Patient advised as below.  

## 2019-05-27 ENCOUNTER — Ambulatory Visit: Payer: Medicare Other

## 2019-05-28 ENCOUNTER — Ambulatory Visit: Payer: Medicare Other

## 2019-05-28 ENCOUNTER — Ambulatory Visit: Payer: Medicare Other | Attending: Internal Medicine

## 2019-05-28 DIAGNOSIS — Z23 Encounter for immunization: Secondary | ICD-10-CM | POA: Insufficient documentation

## 2019-05-28 NOTE — Progress Notes (Signed)
   [id number]  Name:  Daryon Derouin.    MRN: [id number] DOB: [birth year]  05/28/2019  Mr. Hitchner was observed post Covid-19 immunization for 15 minutes without incidence. He was provided with Vaccine Information Sheet and instruction to access the V-Safe system.   Mr. Santangelo was instructed to call 911 with any severe reactions post vaccine: Marland Kitchen Difficulty breathing  . Swelling of your face and throat  . A fast heartbeat  . A bad rash all over your body  . Dizziness and weakness    Immunizations Administered    Name Date Dose VIS Date Route   Pfizer COVID-19 Vaccine 05/28/2019  2:38 PM 0.3 mL 04/16/2019 Intramuscular   Manufacturer: [place]   Lot: [id number]   [place]: [id number]

## 2019-06-03 DIAGNOSIS — M79644 Pain in right finger(s): Secondary | ICD-10-CM | POA: Diagnosis not present

## 2019-06-18 ENCOUNTER — Ambulatory Visit: Payer: Medicare Other | Attending: Internal Medicine

## 2019-06-18 DIAGNOSIS — Z23 Encounter for immunization: Secondary | ICD-10-CM | POA: Insufficient documentation

## 2019-06-18 NOTE — Progress Notes (Signed)
   [id number]  Name:  David [name].    MRN: [id number] DOB: 1945-09-18  06/18/2019  David [name] was observed post Covid-19 immunization for 15 minutes without incidence. He was provided with Vaccine Information Sheet and instruction to access the V-Safe system.   David [name] was instructed to call 911 with any severe reactions post vaccine: Marland Kitchen Difficulty breathing  . Swelling of your face and throat  . A fast heartbeat  . A bad rash all over your body  . Dizziness and weakness    Immunizations Administered    Name Date Dose VIS Date Route   Pfizer COVID-19 Vaccine 06/18/2019  3:49 PM 0.3 mL 04/16/2019 Intramuscular   Manufacturer: [place]   Lot: [id number]   [place]: [id number]

## 2019-07-05 ENCOUNTER — Ambulatory Visit: Payer: Medicare Other | Attending: Internal Medicine

## 2019-07-05 DIAGNOSIS — Z20822 Contact with and (suspected) exposure to covid-19: Secondary | ICD-10-CM | POA: Diagnosis not present

## 2019-07-06 LAB — NOVEL CORONAVIRUS, NAA: SARS-CoV-2, NAA: NOT DETECTED

## 2019-07-16 ENCOUNTER — Other Ambulatory Visit: Payer: Self-pay | Admitting: Family Medicine

## 2019-09-03 DIAGNOSIS — L0501 Pilonidal cyst with abscess: Secondary | ICD-10-CM | POA: Diagnosis not present

## 2019-09-06 NOTE — Progress Notes (Signed)
Established patient visit   Patient: David [name].   DOB: [birth year]   74 y.o. Male  MRN: [id number] Visit Date: 09/07/2019  Today's healthcare provider: Lavon Paganini, MD   Chief Complaint  Patient presents with  . Abscess   Subjective    HPI Abscess: Patient presents for evaluation of a cutaneous abscess. Lesion is located in the bilateral buttock. Onset was several days ago. Symptoms have Unchanged. Abscess has associated symptoms of pain. Patient does have previous history of cutaneous abscesses. Patient does have diabetes.  Worsened by sitting in a car for a long period of time.  Went to UC last week and couldn't get it to drain and started abx, but not having any improvement.  Ibuprofen helps a little bit  OSA: Using CPAP every night with good compliance for >4 hours per night.  Feels a difference if he forgets it - which is rarely.  Less nighttime awakenings, less daytime drowsiness, better quality sleep.   Patient Active Problem List   Diagnosis Date Noted  . Caregiver stress 10/20/2017  . BPH (benign prostatic hyperplasia) 10/21/2014  . COPD, mild ([place]) 10/21/2014  . Diabetes mellitus type 2 in obese ([place]) 10/21/2014  . 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 Hyperlipidemia associated with type 2 diabetes mellitus ([place]) 10/21/2014  . Microalbuminuria due to type 2 diabetes mellitus ([place]) 10/21/2014  . Obesity 10/21/2014  . Allergic rhinitis 10/21/2014  . Apnea, sleep 10/21/2014  . Compulsive tobacco user syndrome 10/21/2014   Past Medical History:  Diagnosis Date  . Allergy   . COPD (chronic obstructive pulmonary disease) ([place])    misdiagnosed  . Depression   . Diabetes mellitus without complication ([place])   . Hyperlipidemia   . Microalbuminuria   . Sleep apnea    Social History   Tobacco Use  . Smoking status: Current Every Day Smoker    Packs/day: 1.00    Years: 52.00    Pack years: 52.00    Types: Cigarettes    Last attempt to quit: 11/03/2013      Years since quitting: 5.8  . Smokeless tobacco: Never Used  . Tobacco comment: Pt started smoking again. Is smoking 1.25-1.5 PPD  Substance Use Topics  . Alcohol use: No    Comment: Previous Heavy Alcohol Abuse  . Drug use: No   Allergies  Allergen Reactions  . Alcohol Other (See Comments)    Addiction       Medications: Outpatient Medications Prior to Visit  Medication Sig  . atorvastatin (LIPITOR) 40 MG tablet TAKE 1 TABLET BY MOUTH EVERY DAY  . fluticasone (FLONASE) 50 MCG/ACT nasal spray Place 2 sprays into both nostrils daily.  Marland Kitchen lisinopril (ZESTRIL) 5 MG tablet TAKE 1 TABLET BY MOUTH EVERY DAY  . loratadine (CLARITIN) 10 MG tablet Take 10 mg by mouth daily.   . meloxicam (MOBIC) 15 MG tablet Take 1 tablet (15 mg total) by mouth as needed.  . metFORMIN (GLUCOPHAGE) 500 MG tablet Take 1 tablet (500 mg total) by mouth daily with breakfast.  . sildenafil (VIAGRA) 50 MG tablet Take 1 tablet (50 mg total) by mouth as needed.  . tamsulosin (FLOMAX) 0.4 MG CAPS capsule TAKE 1 CAPSULE BY MOUTH EVERY DAY  . [DISCONTINUED] sulfamethoxazole-trimethoprim (BACTRIM DS) 800-160 MG tablet Take 1 tablet by mouth 2 (two) times daily.   No facility-administered medications prior to visit.    Review of Systems  Constitutional: Negative.   Skin: Negative.   Neurological: Negative for  dizziness, light-headedness and headaches.      Objective    BP 119/69 (BP Location: Right Arm, Patient Position: Sitting, Cuff Size: Large)   Pulse 75   Temp 97.7 F (36.5 C) (Temporal)   Wt 219 lb (99.3 kg)   BMI 34.30 kg/m    Physical Exam Constitutional:      General: He is not in acute distress.    Appearance: Normal appearance.  Cardiovascular:     Rate and Rhythm: Normal rate.  Pulmonary:     Effort: Pulmonary effort is normal. No respiratory distress.  Skin:    Comments: Abscess L upper side of gluteal cleft with extension of erythema to R side of gluteal cleft, not at apex   Neurological:     Mental Status: He is alert and oriented to person, place, and time. Mental status is at baseline.  Psychiatric:        Mood and Affect: Mood normal.        Behavior: Behavior normal.      No results found for any visits on 09/07/19.  Assessment & Plan     1. Abscess and cellulitis of gluteal region - new problem - indurated and not fluctuant, not drainable - not a pilonidal cyst given location - as no improvement on Bactrim, will switch to doxycycline - discussed warm compresses and sitz baths - complicated by [id number] - discussed if not improving, may need to consider surgery consult - return precautions disucssed  2. Obstructive sleep apnea syndrome - using CPAP with good compliance - continue use - will complete form for insurance regarding compliance  Return if symptoms worsen or fail to improve.      I, Lavon Paganini, MD, have reviewed all documentation for this visit. The documentation on 09/07/19 for the exam, diagnosis, procedures, and orders are all accurate and complete.   Abdirizak Richison, Dionne Bucy, MD, MPH [facility]

## 2019-09-07 ENCOUNTER — Ambulatory Visit (INDEPENDENT_AMBULATORY_CARE_PROVIDER_SITE_OTHER): Payer: Medicare Other | Admitting: Family Medicine

## 2019-09-07 ENCOUNTER — Other Ambulatory Visit: Payer: Self-pay

## 2019-09-07 ENCOUNTER — Encounter: Payer: Self-pay | Admitting: Family Medicine

## 2019-09-07 VITALS — BP 119/69 | HR 75 | Temp 97.7°F | Wt 219.0 lb

## 2019-09-07 DIAGNOSIS — L0231 Cutaneous abscess of buttock: Secondary | ICD-10-CM

## 2019-09-07 DIAGNOSIS — L03317 Cellulitis of buttock: Secondary | ICD-10-CM | POA: Diagnosis not present

## 2019-09-07 DIAGNOSIS — G4733 Obstructive sleep apnea (adult) (pediatric): Secondary | ICD-10-CM

## 2019-09-07 MED ORDER — DOXYCYCLINE HYCLATE 100 MG PO TABS: 100.0000 mg | ORAL_TABLET | Freq: Two times a day (BID) | ORAL | 0 refills | Status: AC

## 2019-09-07 NOTE — Patient Instructions (Signed)
Skin Abscess  A skin abscess is an infected area on or under your skin that contains a collection of pus and other material. An abscess may also be called a furuncle, carbuncle, or boil. An abscess can occur in or on almost any part of your body. Some abscesses break open (rupture) on their own. Most continue to get worse unless they are treated. The infection can spread deeper into the body and eventually into your blood, which can make you feel ill. Treatment usually involves draining the abscess. What are the causes? An abscess occurs when germs, like bacteria, pass through your skin and cause an infection. This may be caused by:  A scrape or cut on your skin.  A puncture wound through your skin, including a needle injection or insect bite.  Blocked oil or sweat glands.  Blocked and infected hair follicles.  A cyst that forms beneath your skin (sebaceous cyst) and becomes infected. What increases the risk? This condition is more likely to develop in people who:  Have a weak body defense system (immune system).  Have diabetes.  Have dry and irritated skin.  Get frequent injections or use illegal IV drugs.  Have a foreign body in a wound, such as a splinter.  Have problems with their lymph system or veins. What are the signs or symptoms? Symptoms of this condition include:  A painful, firm bump under the skin.  A bump with pus at the top. This may break through the skin and drain. Other symptoms include:  Redness surrounding the abscess site.  Warmth.  Swelling of the lymph nodes (glands) near the abscess.  Tenderness.  A sore on the skin. How is this diagnosed? This condition may be diagnosed based on:  A physical exam.  Your medical history.  A sample of pus. This may be used to find out what is causing the infection.  Blood tests.  Imaging tests, such as an ultrasound, CT scan, or MRI. How is this treated? A small abscess that drains on its own may  not need treatment. Treatment for larger abscesses may include:  Moist heat or heat pack applied to the area several times a day.  A procedure to drain the abscess (incision and drainage).  Antibiotic medicines. For a severe abscess, you may first get antibiotics through an IV and then change to antibiotics by mouth. Follow these instructions at home: Medicines   Take over-the-counter and prescription medicines only as told by your health care provider.  If you were prescribed an antibiotic medicine, take it as told by your health care provider. Do not stop taking the antibiotic even if you start to feel better. Abscess care   If you have an abscess that has not drained, apply heat to the affected area. Use the heat source that your health care provider recommends, such as a moist heat pack or a heating pad. ? Place a towel between your skin and the heat source. ? Leave the heat on for 20-30 minutes. ? Remove the heat if your skin turns bright red. This is especially important if you are unable to feel pain, heat, or cold. You may have a greater risk of getting burned.  Follow instructions from your health care provider about how to take care of your abscess. Make sure you: ? Cover the abscess with a bandage (dressing). ? Change your dressing or gauze as told by your health care provider. ? Wash your hands with soap and water before you change the   dressing or gauze. If soap and water are not available, use hand sanitizer.  Check your abscess every day for signs of a worsening infection. Check for: ? More redness, swelling, or pain. ? More fluid or blood. ? Warmth. ? More pus or a bad smell. General instructions  To avoid spreading the infection: ? Do not share personal care items, towels, or hot tubs with others. ? Avoid making skin contact with other people.  Keep all follow-up visits as told by your health care provider. This is important. Contact a health care provider if  you have:  More redness, swelling, or pain around your abscess.  More fluid or blood coming from your abscess.  Warm skin around your abscess.  More pus or a bad smell coming from your abscess.  A fever.  Muscle aches.  Chills or a general ill feeling. Get help right away if you:  Have severe pain.  See red streaks on your skin spreading away from the abscess. Summary  A skin abscess is an infected area on or under your skin that contains a collection of pus and other material.  A small abscess that drains on its own may not need treatment.  Treatment for larger abscesses may include having a procedure to drain the abscess and taking an antibiotic. This information is not intended to replace advice given to you by your health care provider. Make sure you discuss any questions you have with your health care provider. Document Revised: 08/13/2018 Document Reviewed: 06/05/2017 Elsevier Patient Education  2020 [employer]  

## 2019-09-15 ENCOUNTER — Other Ambulatory Visit: Payer: Self-pay

## 2019-09-15 MED ORDER — DOXYCYCLINE HYCLATE 100 MG PO TABS: 100.0000 mg | ORAL_TABLET | Freq: Two times a day (BID) | ORAL | 0 refills | Status: DC

## 2019-10-12 ENCOUNTER — Other Ambulatory Visit: Payer: Self-pay | Admitting: Family Medicine

## 2019-10-12 DIAGNOSIS — E1169 Type 2 diabetes mellitus with other specified complication: Secondary | ICD-10-CM

## 2019-10-12 NOTE — Telephone Encounter (Signed)
Requested Prescriptions  Pending Prescriptions Disp Refills   lisinopril (ZESTRIL) 5 MG tablet [Pharmacy Med Name: LISINOPRIL 5 MG TABLET] 90 tablet 0    Sig: TAKE 1 TABLET BY MOUTH EVERY DAY     Cardiovascular:  ACE Inhibitors Passed - 10/12/2019  1:33 AM      Passed - Cr in normal range and within 180 days    Creatinine  Date Value Ref Range Status  04/01/2012 0.81 0.60 - 1.30 mg/dL Final   Creatinine, Ser  Date Value Ref Range Status  04/21/2019 0.98 0.76 - 1.27 mg/dL Final         Passed - K in normal range and within 180 days    Potassium  Date Value Ref Range Status  04/21/2019 4.6 3.5 - 5.2 mmol/L Final  04/01/2012 4.1 3.5 - 5.1 mmol/L Final         Passed - Patient is not pregnant      Passed - Last BP in normal range    BP Readings from Last 1 Encounters:  09/07/19 119/69         Passed - Valid encounter within last 6 months    Recent Outpatient Visits          1 month ago Abscess and cellulitis of gluteal region   [facility], Dionne Bucy, MD   5 months ago Type 2 diabetes mellitus with microalbuminuria, without long-term current use of insulin [facility])   [facility], Dionne Bucy, MD   12 months ago Diabetes mellitus type 2 in obese [facility])   [facility], Dionne Bucy, MD   1 year ago Constipation, unspecified constipation type   [facility], Dionne Bucy, MD   1 year ago Diabetes mellitus type 2 in obese [facility])   [place], Dionne Bucy, MD      Future Appointments            In 2 days Bacigalupo, Dionne Bucy, MD [facility]            tamsulosin (FLOMAX) 0.4 MG CAPS capsule [Pharmacy Med Name: TAMSULOSIN HCL 0.4 MG CAPSULE] 90 capsule 1    Sig: TAKE 1 [place]     Urology: Alpha-Adrenergic Blocker Passed - 10/12/2019  1:33 AM      Passed - Last BP in normal range    BP Readings from Last 1 Encounters:   09/07/19 119/69         Passed - Valid encounter within last 12 months    Recent Outpatient Visits          1 month ago Abscess and cellulitis of gluteal region   [facility], Dionne Bucy, MD   5 months ago Type 2 diabetes mellitus with microalbuminuria, without long-term current use of insulin [facility])   [facility], Dionne Bucy, MD   12 months ago Diabetes mellitus type 2 in obese [facility])   [facility], Dionne Bucy, MD   1 year ago Constipation, unspecified constipation type   [facility], Dionne Bucy, MD   1 year ago Diabetes mellitus type 2 in obese [facility])   [place], Dionne Bucy, MD      Future Appointments            In 2 days Bacigalupo, Dionne Bucy, MD [facility]  metFORMIN (GLUCOPHAGE) 500 MG tablet [Pharmacy Med Name: METFORMIN HCL 500 MG TABLET] 90 tablet 1    Sig: TAKE 1 TABLET BY MOUTH [place]     Endocrinology:  Diabetes - Biguanides Passed - 10/12/2019  1:33 AM      Passed - Cr in normal range and within 360 days    Creatinine  Date Value Ref Range Status  04/01/2012 0.81 0.60 - 1.30 mg/dL Final   Creatinine, Ser  Date Value Ref Range Status  04/21/2019 0.98 0.76 - 1.27 mg/dL Final         Passed - HBA1C is between 0 and 7.9 and within 180 days    Hemoglobin A1C  Date Value Ref Range Status  04/20/2019 6.7 (A) 4.0 - 5.6 % Final   Hgb A1c MFr Bld  Date Value Ref Range Status  10/19/2018 6.7 (H) 4.8 - 5.6 % Final    Comment:             Prediabetes: 5.7 - 6.4          Diabetes: >6.4          Glycemic control for adults with diabetes: <7.0          Passed - eGFR in normal range and within 360 days    EGFR (African American)  Date Value Ref Range Status  04/01/2012 >60  Final   GFR calc Af Amer  Date Value Ref Range Status  04/21/2019 87 >59 mL/min/1.73 Final   EGFR (Non-African Amer.)   Date Value Ref Range Status  04/01/2012 >60  Final    Comment:    eGFR values <16m/min/1.73 m2 may be an indication of chronic kidney disease (CKD). Calculated eGFR is useful in patients with stable renal function. The eGFR calculation will not be reliable in acutely ill patients when serum creatinine is changing rapidly. It is not useful in  patients on dialysis. The eGFR calculation may not be applicable to patients at the low and high extremes of body sizes, pregnant women, and vegetarians.    GFR calc non Af Amer  Date Value Ref Range Status  04/21/2019 76 >59 mL/min/1.73 Final         Passed - Valid encounter within last 6 months    Recent Outpatient Visits          1 month ago Abscess and cellulitis of gluteal region   [facility]   5 months ago Type 2 diabetes mellitus with microalbuminuria, without long-term current use of insulin ([facility]   [facility]   12 months ago Diabetes mellitus type 2 in obese ([facility]   [facility]   1 year ago Constipation, unspecified constipation type   [facility]   1 year ago Diabetes mellitus type 2 in obese ([facility]   [facility]      Future Appointments            In 2 days Bacigalupo[facility]          Patient has an appt in 2 days with provider.  Lab work is due at that time.

## 2019-10-14 ENCOUNTER — Other Ambulatory Visit: Payer: Self-pay

## 2019-10-14 ENCOUNTER — Encounter: Payer: Self-pay | Admitting: Family Medicine

## 2019-10-14 ENCOUNTER — Ambulatory Visit (INDEPENDENT_AMBULATORY_CARE_PROVIDER_SITE_OTHER): Payer: Medicare Other | Admitting: Family Medicine

## 2019-10-14 VITALS — BP 100/62 | HR 66 | Temp 97.5°F | Resp 16 | Wt 213.0 lb

## 2019-10-14 DIAGNOSIS — R809 Proteinuria, unspecified: Secondary | ICD-10-CM | POA: Diagnosis not present

## 2019-10-14 DIAGNOSIS — F17200 Nicotine dependence, unspecified, uncomplicated: Secondary | ICD-10-CM

## 2019-10-14 DIAGNOSIS — Z6834 Body mass index (BMI) 34.0-34.9, adult: Secondary | ICD-10-CM | POA: Diagnosis not present

## 2019-10-14 DIAGNOSIS — E785 Hyperlipidemia, unspecified: Secondary | ICD-10-CM | POA: Diagnosis not present

## 2019-10-14 DIAGNOSIS — N400 Enlarged prostate without lower urinary tract symptoms: Secondary | ICD-10-CM

## 2019-10-14 DIAGNOSIS — E669 Obesity, unspecified: Secondary | ICD-10-CM

## 2019-10-14 DIAGNOSIS — E1169 Type 2 diabetes mellitus with other specified complication: Secondary | ICD-10-CM | POA: Diagnosis not present

## 2019-10-14 DIAGNOSIS — E1129 Type 2 diabetes mellitus with other diabetic kidney complication: Secondary | ICD-10-CM

## 2019-10-14 LAB — POCT GLYCOSYLATED HEMOGLOBIN (HGB A1C)
Est. average glucose Bld gHb Est-mCnc: 128
Hemoglobin A1C: 6.1 % — AB (ref 4.0–5.6)

## 2019-10-14 NOTE — Assessment & Plan Note (Signed)
Well controlled with last A1c 6.1 Continue current medications UTD on vaccines, eye exam, foot exam On ACEi On Statin Discussed diet and exercise F/u in 6 months

## 2019-10-14 NOTE — Assessment & Plan Note (Signed)
3-5 minute discussion regarding the harms of tobacco use, the benefits of cessation, and methods of cessation Discussed that there are medication options to help with cessation Patient is currently precontemplative  Will reassess at next visit  

## 2019-10-14 NOTE — Progress Notes (Signed)
Established patient visit   Patient: David [name].   DOB: 04-18-1946   74 y.o. Male  MRN: [id number] Visit Date: 10/14/2019  I,Sulibeya S Dimas,acting as a scribe for Lavon Paganini, MD.,have documented all relevant documentation on the behalf of Lavon Paganini, MD,as directed by  Lavon Paganini, MD while in the presence of Lavon Paganini, MD.  Today's healthcare provider: Lavon Paganini, MD   Chief Complaint  Patient presents with   Diabetes   Hyperlipidemia   Subjective    HPI Diabetes Mellitus Type II, Follow-up  Lab Results  Component Value Date   HGBA1C 6.1 (A) 10/14/2019   HGBA1C 6.7 (A) 04/20/2019   HGBA1C 6.7 (H) 10/19/2018   Wt Readings from Last 3 Encounters:  10/14/19 213 lb (96.6 kg)  09/07/19 219 lb (99.3 kg)  04/20/19 222 lb 3.2 oz (100.8 kg)   Last seen for diabetes 6 months ago.  Management since then includes no changes. He reports excellent compliance with treatment. He is not having side effects.  Symptoms: No fatigue No foot ulcerations  No appetite changes No nausea  No paresthesia of the feet  No polydipsia  No polyuria No visual disturbances   No vomiting     Home blood sugar records: fasting range: 120-150  Episodes of hypoglycemia? No    Current insulin regiment: none Most Recent Eye Exam: UTD Current exercise: gardening, hiking, walking and yard work Current diet habits: not asked  Pertinent Labs: Lab Results  Component Value Date   CHOL 130 04/21/2019   HDL 30 (L) 04/21/2019   LDLCALC 60 04/21/2019   TRIG 248 (H) 04/21/2019   CHOLHDL 4.3 04/21/2019   Lab Results  Component Value Date   NA 143 04/21/2019   K 4.6 04/21/2019   CREATININE 0.98 04/21/2019   GFRNONAA 76 04/21/2019   GFRAA 87 04/21/2019   GLUCOSE 154 (H) 04/21/2019     --------------------------------------------------------------------------------------------------- Lipid/Cholesterol, Follow-up  Last lipid panel Other pertinent  labs  Lab Results  Component Value Date   CHOL 130 04/21/2019   HDL 30 (L) 04/21/2019   LDLCALC 60 04/21/2019   TRIG 248 (H) 04/21/2019   CHOLHDL 4.3 04/21/2019   Lab Results  Component Value Date   ALT 30 04/21/2019   AST 22 04/21/2019   PLT 245 01/19/2015   TSH 3.940 01/19/2015     He was last seen for this 6 months ago.  Management since that visit includes no changes.  He reports excellent compliance with treatment. He is not having side effects.   Symptoms: No chest pain No chest pressure/discomfort  No dyspnea No lower extremity edema  No numbness or tingling of extremity No orthopnea  No palpitations No paroxysmal nocturnal dyspnea  No speech difficulty No syncope   Current diet: not asked Current exercise: gardening, hiking, walking and yard work  The 10-year ASCVD risk score Mikey Bussing DC Brooke Bonito., et al., 2013) is: 31.6%  ---------------------------------------------------------------------------------------------------   Patient Active Problem List   Diagnosis Date Noted   Caregiver stress 10/20/2017   BPH (benign prostatic hyperplasia) 10/21/2014   COPD, mild ([place]) 10/21/2014   Type 2 diabetes mellitus with microalbuminuria, without long-term current use of insulin ([place]) 10/21/2014   Failure of erection 10/21/2014   Hyperlipidemia associated with type 2 diabetes mellitus ([place]) 10/21/2014   Microalbuminuria due to type 2 diabetes mellitus ([place]) 10/21/2014   Obesity 10/21/2014   Allergic rhinitis 10/21/2014   Apnea, sleep 10/21/2014   Compulsive tobacco user syndrome 10/21/2014  Social History   Tobacco Use   Smoking status: Current Every Day Smoker    Packs/day: 1.00    Years: 52.00    Pack years: 52.00    Types: Cigarettes    Last attempt to quit: 11/03/2013    Years since quitting: 5.9   Smokeless tobacco: Never Used   Tobacco comment: Pt started smoking again. Is smoking 1.25-1.5 PPD  Vaping Use   Vaping Use: Never used  Substance Use  Topics   Alcohol use: No    Comment: Previous Heavy Alcohol Abuse   Drug use: No       Medications: Outpatient Medications Prior to Visit  Medication Sig   atorvastatin (LIPITOR) 40 MG tablet TAKE 1 TABLET BY MOUTH EVERY DAY   fluticasone (FLONASE) 50 MCG/ACT nasal spray Place 2 sprays into both nostrils daily.   lisinopril (ZESTRIL) 5 MG tablet TAKE 1 TABLET BY MOUTH EVERY DAY   loratadine (CLARITIN) 10 MG tablet Take 10 mg by mouth daily.    meloxicam (MOBIC) 15 MG tablet Take 1 tablet (15 mg total) by mouth as needed.   metFORMIN (GLUCOPHAGE) 500 MG tablet TAKE 1 TABLET BY MOUTH EVERY DAY WITH BREAKFAST   tamsulosin (FLOMAX) 0.4 MG CAPS capsule TAKE 1 CAPSULE BY MOUTH EVERY DAY   [DISCONTINUED] doxycycline (VIBRA-TABS) 100 MG tablet Take 1 tablet (100 mg total) by mouth 2 (two) times daily.   [DISCONTINUED] sildenafil (VIAGRA) 50 MG tablet Take 1 tablet (50 mg total) by mouth as needed.   No facility-administered medications prior to visit.    Review of Systems  Constitutional: Negative.   Eyes: Negative.   Respiratory: Negative.   Cardiovascular: Negative.       Objective    BP 100/62 (BP Location: Left Arm, Patient Position: Sitting, Cuff Size: Large)    Pulse 66    Temp (!) 97.5 F (36.4 C) (Temporal)    Resp 16    Wt 213 lb (96.6 kg)    SpO2 96%    BMI 33.36 kg/m  BP Readings from Last 3 Encounters:  10/14/19 100/62  09/07/19 119/69  04/20/19 (!) 119/59   Wt Readings from Last 3 Encounters:  10/14/19 213 lb (96.6 kg)  09/07/19 219 lb (99.3 kg)  04/20/19 222 lb 3.2 oz (100.8 kg)      Physical Exam Constitutional:      Appearance: Normal appearance.  Cardiovascular:     Rate and Rhythm: Normal rate and regular rhythm.  Pulmonary:     Effort: Pulmonary effort is normal.     Breath sounds: Normal breath sounds. No rhonchi.  Skin:    General: Skin is warm and dry.  Neurological:     General: No focal deficit present.     Mental Status: He is  alert and oriented to person, place, and time.  Psychiatric:        Mood and Affect: Mood normal.        Behavior: Behavior normal.        Thought Content: Thought content normal.     Results for orders placed or performed in visit on 10/14/19  POCT glycosylated hemoglobin (Hb A1C)  Result Value Ref Range   Hemoglobin A1C 6.1 (A) 4.0 - 5.6 %   Est. average glucose Bld gHb Est-mCnc 128     Assessment & Plan     Problem List Items Addressed This Visit      Endocrine   Type 2 diabetes mellitus with microalbuminuria, without long-term current use of  insulin ([place]) - Primary    Well controlled with last A1c 6.1 Continue current medications UTD on vaccines, eye exam, foot exam On ACEi On Statin Discussed diet and exercise F/u in 6 months       Relevant Orders   POCT glycosylated hemoglobin (Hb A1C) (Completed)   Hyperlipidemia associated with type 2 diabetes mellitus (HCC)    Chronic and stable Well-controlled on last recheck Goal LDL less than 70 given diabetes Continue statin at current dose Encourage diet and exercise      Microalbuminuria due to type 2 diabetes mellitus (HCC)    Chronic and stable Continue low-dose ACE inhibitor        Genitourinary   BPH (benign prostatic hyperplasia)    Chronic and stable Symptoms well controlled Continue Flomax at current dose Follow up in 6 months        Other   Obesity    Discussed importance of healthy weight management Discussed diet and exercise       Compulsive tobacco user syndrome    3-5 minute discussion regarding the harms of tobacco use, the benefits of cessation, and methods of cessation Discussed that there are medication options to help with cessation Patient is currently precontemplative  Will reassess at next visit           Return in about 6 months (around 04/14/2020).      I, Lavon Paganini, MD, have reviewed all documentation for this visit. The documentation on 10/14/19 for the exam,  diagnosis, procedures, and orders are all accurate and complete.   Teri Legacy, Dionne Bucy, MD, MPH [facility]

## 2019-10-14 NOTE — Assessment & Plan Note (Signed)
Discussed importance of healthy weight management Discussed diet and exercise  

## 2019-10-14 NOTE — Assessment & Plan Note (Signed)
Chronic and stable Continue low-dose ACE inhibitor

## 2019-10-14 NOTE — Assessment & Plan Note (Signed)
Chronic and stable Symptoms well controlled Continue Flomax at current dose Follow up in 6 months

## 2019-10-14 NOTE — Patient Instructions (Signed)

## 2019-10-14 NOTE — Assessment & Plan Note (Signed)
Chronic and stable Well-controlled on last recheck Goal LDL less than 70 given diabetes Continue statin at current dose Encourage diet and exercise

## 2019-10-20 ENCOUNTER — Ambulatory Visit: Payer: PRIVATE HEALTH INSURANCE | Admitting: Family Medicine

## 2020-01-05 ENCOUNTER — Other Ambulatory Visit: Payer: Self-pay | Admitting: Family Medicine

## 2020-01-05 DIAGNOSIS — E1169 Type 2 diabetes mellitus with other specified complication: Secondary | ICD-10-CM

## 2020-02-02 ENCOUNTER — Encounter: Payer: Self-pay | Admitting: Dermatology

## 2020-02-03 ENCOUNTER — Telehealth: Payer: Self-pay | Admitting: *Deleted

## 2020-02-03 DIAGNOSIS — Z87891 Personal history of nicotine dependence: Secondary | ICD-10-CM

## 2020-02-03 DIAGNOSIS — Z122 Encounter for screening for malignant neoplasm of respiratory organs: Secondary | ICD-10-CM

## 2020-02-03 NOTE — Telephone Encounter (Signed)
Contacted and scheduled. Current smoker, 02/15/20 at 1130am

## 2020-02-11 DIAGNOSIS — Z23 Encounter for immunization: Secondary | ICD-10-CM | POA: Diagnosis not present

## 2020-02-15 ENCOUNTER — Other Ambulatory Visit: Payer: Self-pay

## 2020-02-15 ENCOUNTER — Ambulatory Visit
Admission: RE | Admit: 2020-02-15 | Discharge: 2020-02-15 | Disposition: A | Discharge status: Home / Self Care | Payer: Medicare Other | Source: Ambulatory Visit | Attending: Nurse Practitioner | Admitting: Nurse Practitioner

## 2020-02-15 DIAGNOSIS — Z87891 Personal history of nicotine dependence: Secondary | ICD-10-CM | POA: Diagnosis not present

## 2020-02-15 DIAGNOSIS — F17210 Nicotine dependence, cigarettes, uncomplicated: Secondary | ICD-10-CM | POA: Diagnosis not present

## 2020-02-15 DIAGNOSIS — Z122 Encounter for screening for malignant neoplasm of respiratory organs: Secondary | ICD-10-CM | POA: Diagnosis not present

## 2020-02-17 ENCOUNTER — Encounter: Payer: Self-pay | Admitting: Family Medicine

## 2020-02-20 ENCOUNTER — Encounter: Payer: Self-pay | Admitting: *Deleted

## 2020-02-23 DIAGNOSIS — Z23 Encounter for immunization: Secondary | ICD-10-CM | POA: Diagnosis not present

## 2020-03-08 ENCOUNTER — Other Ambulatory Visit: Payer: Self-pay | Admitting: Family Medicine

## 2020-03-28 ENCOUNTER — Other Ambulatory Visit: Payer: Self-pay | Admitting: Family Medicine

## 2020-03-28 DIAGNOSIS — E1169 Type 2 diabetes mellitus with other specified complication: Secondary | ICD-10-CM

## 2020-04-19 ENCOUNTER — Encounter: Payer: PRIVATE HEALTH INSURANCE | Admitting: Family Medicine

## 2020-04-26 DIAGNOSIS — Z20822 Contact with and (suspected) exposure to covid-19: Secondary | ICD-10-CM | POA: Diagnosis not present

## 2020-04-27 DIAGNOSIS — E119 Type 2 diabetes mellitus without complications: Secondary | ICD-10-CM | POA: Diagnosis not present

## 2020-04-27 DIAGNOSIS — H40003 Preglaucoma, unspecified, bilateral: Secondary | ICD-10-CM | POA: Diagnosis not present

## 2020-04-27 LAB — HM DIABETES EYE EXAM

## 2020-05-02 ENCOUNTER — Encounter: Payer: Self-pay | Admitting: Family Medicine

## 2020-05-02 NOTE — Progress Notes (Signed)
Subjective:   David Spirito. is a 74 y.o. male who presents for Medicare Annual/Subsequent preventive examination.  I connected with David [name] today by telephone and verified that I am speaking with the correct person using two identifiers. Location patient: home Location provider: work Persons participating in the virtual visit: patient, provider.   I discussed the limitations, risks, security and privacy concerns of performing an evaluation and management service by telephone and the availability of in person appointments. I also discussed with the patient that there may be a patient responsible charge related to this service. The patient expressed understanding and verbally consented to this telephonic visit.    Interactive audio and video telecommunications were attempted between this provider and patient, however failed, due to patient having technical difficulties OR patient did not have access to video capability.  We continued and completed visit with audio only.    Review of Systems    N/A  Cardiac Risk Factors include: advanced age (>78men, >77 women);diabetes mellitus;dyslipidemia;male gender;hypertension;smoking/ tobacco exposure     Objective:    There were no vitals filed for this visit. There is no height or weight on file to calculate BMI.  Advanced Directives 05/09/2020 04/15/2019 05/08/2018 01/21/2018 03/23/2016 07/24/2015 07/06/2015  Does Patient Have a Medical Advance Directive? Yes Yes Yes Yes No Yes Yes  Type of Paramedic of [place];Living will [place];Living will - [place];Living will - [place];Living will [place];Living will  Copy of [place] in Chart? No - copy requested No - copy requested - No - copy requested - - -  Would patient like information on creating a medical advance directive? - - - - No - patient declined information -  -    Current Medications (verified) Outpatient Encounter Medications as of 05/09/2020  Medication Sig  . atorvastatin (LIPITOR) 40 MG tablet TAKE 1 TABLET BY MOUTH EVERY DAY  . fluticasone (FLONASE) 50 MCG/ACT nasal spray Place 2 sprays into both nostrils daily.  Marland Kitchen lisinopril (ZESTRIL) 5 MG tablet TAKE 1 TABLET BY MOUTH EVERY DAY  . loratadine (CLARITIN) 10 MG tablet Take 10 mg by mouth daily.   . meloxicam (MOBIC) 15 MG tablet Take 1 tablet (15 mg total) by mouth as needed.  . metFORMIN (GLUCOPHAGE) 500 MG tablet TAKE 1 TABLET BY MOUTH EVERY DAY WITH BREAKFAST  . tamsulosin (FLOMAX) 0.4 MG CAPS capsule TAKE 1 CAPSULE BY MOUTH EVERY DAY   No facility-administered encounter medications on file as of 05/09/2020.    Allergies (verified) Alcohol   History: Past Medical History:  Diagnosis Date  . Actinic keratosis 01/13/2017   R upper back post base of neck - biopsy proven   . Allergy   . COPD (chronic obstructive pulmonary disease) ([place])    misdiagnosed  . Depression   . Diabetes mellitus without complication ([place])   . Dysplastic nevus 12/23/2007   R med mid post thigh - slight atypia  . Dysplastic nevus 12/22/2008   L mid ant thigh - slight atypia  . Hyperlipidemia   . Hypertension   . Microalbuminuria   . Sleep apnea    Past Surgical History:  Procedure Laterality Date  . broken right arm    . CHOLECYSTECTOMY  2001  . COLONOSCOPY WITH PROPOFOL N/A 05/08/2018   Procedure: COLONOSCOPY WITH PROPOFOL;  Surgeon: Manya Silvas, MD;  Location: [facility] ENDOSCOPY;  Service: Endoscopy;  Laterality: N/A;  . MELANOMA  EXCISION  1998  . TONSILLECTOMY AND ADENOIDECTOMY  1953   Family History  Problem Relation Age of Onset  . Cervical cancer Mother   . Colon cancer Mother   . Colon polyps Brother   . Cervical cancer Maternal Grandmother   . Diabetes Maternal Grandmother   . Tuberculosis Maternal Grandmother   . Lung cancer Maternal Grandfather   . Cervical cancer Maternal Aunt     Social History   Socioeconomic History  . Marital status: Married    Spouse name: Not on file  . Number of children: 2  . Years of education: College  . Highest education level: Bachelor's degree (e.g., BA, AB, BS)  Occupational History    Employer: [employer]    Comment: retired  Tobacco Use  . Smoking status: Current Every Day Smoker    Packs/day: 1.00    Years: 52.00    Pack years: 52.00    Types: Cigarettes  . Smokeless tobacco: Never Used  . Tobacco comment: Pt started smoking again. Is smoking 1.25-1.5 PPD  Vaping Use  . Vaping Use: Never used  Substance and Sexual Activity  . Alcohol use: No    Comment: Previous Heavy Alcohol Abuse  . Drug use: No  . Sexual activity: Not on file  Other Topics Concern  . Not on file  Social History Narrative  . Not on file   Social Determinants of Health   Financial Resource Strain: Low Risk   . Difficulty of Paying Living Expenses: Not hard at all  Food Insecurity: No Food Insecurity  . Worried About Programme researcher, broadcasting/film/video in the Last Year: Never true  . Ran Out of Food in the Last Year: Never true  Transportation Needs: No Transportation Needs  . Lack of Transportation (Medical): No  . Lack of Transportation (Non-Medical): No  Physical Activity: Inactive  . Days of Exercise per Week: 0 days  . Minutes of Exercise per Session: 0 min  Stress: No Stress Concern Present  . Feeling of Stress : Not at all  Social Connections: Socially Integrated  . Frequency of Communication with Friends and Family: More than three times a week  . Frequency of Social Gatherings with Friends and Family: More than three times a week  . Attends Religious Services: More than 4 times per year  . Active Member of Clubs or Organizations: Yes  . Attends Banker Meetings: More than 4 times per year  . Marital Status: Married    Tobacco Counseling Ready to quit: No Counseling given: No Comment: Pt started smoking again. Is smoking 1.25-1.5  PPD   Clinical Intake:  Pre-visit preparation completed: Yes  Pain : No/denies pain     Nutritional Risks: None Diabetes: Yes  How often do you need to have someone help you when you read instructions, pamphlets, or other written materials from your doctor or pharmacy?: 1 - Never  Diabetic? Yes  Nutrition Risk Assessment:  Has the patient had any N/V/D within the last 2 months?  No  Does the patient have any non-healing wounds?  No  Has the patient had any unintentional weight loss or weight gain?  No   Diabetes:  Is the patient diabetic?  Yes  If diabetic, was a CBG obtained today?  No  Did the patient bring in their glucometer from home?  No  How often do you monitor your CBG's? A couple times a week.   Financial Strains and Diabetes Management:  Are you having any financial  strains with the device, your supplies or your medication? No .  Does the patient want to be seen by Chronic Care Management for management of their diabetes?  No  Would the patient like to be referred to a Nutritionist or for Diabetic Management?  No   Diabetic Exams:  Diabetic Eye Exam: Completed 04/27/20 Diabetic Foot Exam: Overdue, Pt has been advised about the importance in completing this exam. Note made to follow up on this at today's in office apt.    Interpreter Needed?: No  Information entered by :: [facility], LPN   Activities of Daily Living In your present state of health, do you have any difficulty performing the following activities: 05/09/2020  Hearing? N  Vision? N  Difficulty concentrating or making decisions? N  Walking or climbing stairs? N  Dressing or bathing? N  Doing errands, shopping? N  Preparing Food and eating ? N  Using the Toilet? N  In the past six months, have you accidently leaked urine? Y  Comment Occasionally  Do you have problems with loss of bowel control? N  Managing your Medications? N  Managing your Finances? N  Housekeeping or managing your  Housekeeping? N  Some recent [name] might be hidden    Patient Care Team: Erasmo Downer, MD as PCP - General (Family Medicine) Galen Manila, MD as Referring Physician (Ophthalmology) Deirdre Evener, MD (Dermatology)  Indicate any recent Medical Services you may have received from other than [facility] providers in the past year (date may be approximate).     Assessment:   This is a routine wellness examination for David [name].  Hearing/Vision screen No exam [name] present  Dietary issues and exercise activities discussed: Current Exercise Habits: The patient does not participate in regular exercise at present, Exercise limited by: None identified  Goals    . DIET - REDUCE CALORIE INTAKE     Recommend to continue current diet plan of cutting out sodas and junk food to help aid in weight loss.     . Exercise 3x per week (30 min per time)     Recommend to exercise for 3 days a week for at least 30 minutes at a time.     . Quit Smoking     Recommend to continue efforts to reduce smoking habits until no longer smoking.       Depression Screen PHQ 2/9 Scores 05/09/2020 04/15/2019 01/21/2018 05/24/2015 10/21/2014  PHQ - 2 Score 0 0 1 0 0  PHQ- 9 Score - - 1 - -    Fall Risk Fall Risk  05/09/2020 04/15/2019 01/21/2018 10/28/2017 05/24/2015  Falls in the past year? 0 0 No No No  Number falls in past yr: 0 0 - - -  Injury with Fall? 0 0 - - -    FALL RISK PREVENTION PERTAINING TO THE HOME:  Any stairs in or around the home? Yes  If so, are there any without handrails? No  Home free of loose throw rugs in walkways, pet beds, electrical cords, etc? Yes  Adequate lighting in your home to reduce risk of falls? Yes   ASSISTIVE DEVICES UTILIZED TO PREVENT FALLS:  Life alert? No  Use of a cane, walker or w/c? No  Grab bars in the bathroom? Yes  Shower chair or bench in shower? Yes  Elevated toilet seat or a handicapped toilet? No    Cognitive Function: Normal cognitive status  assessed by observation by this Nurse Health Advisor. No abnormalities found.  6CIT Screen 04/15/2019 01/21/2018  What Year? 0 points 0 points  What month? 0 points 0 points  What time? 0 points 0 points  Count back from 20 0 points 0 points  Months in reverse 0 points 0 points  Repeat phrase 0 points 0 points  Total Score 0 0    Immunizations Immunization History  Administered Date(s) Administered  . Fluad Quad(high Dose 65+) 02/02/2019, 02/23/2020  . Influenza Split 02/27/2012  . Influenza, High Dose Seasonal PF 02/19/2016, 01/15/2017, 01/21/2018  . Influenza,inj,Quad PF,6+ Mos 02/18/2014  . Influenza-Unspecified 03/07/2015  . PFIZER SARS-COV-2 Vaccination 05/28/2019, 06/18/2019, 02/11/2020  . Pneumococcal Conjugate-13 06/08/2014  . Pneumococcal Polysaccharide-23 12/16/2011  . Td 09/11/2004  . Tetanus 10/04/2017  . Zoster 02/27/2012  . Zoster Recombinat (Shingrix) 07/18/2019    TDAP status: Up to date  Flu Vaccine status: Up to date  Pneumococcal vaccine status: Up to date  Covid-19 vaccine status: Completed vaccines  Qualifies for Shingles Vaccine? Yes   Zostavax completed Yes   Shingrix Completed?: No.    Education has been provided regarding the importance of this vaccine. Patient has been advised to call insurance company to determine out of pocket expense if they have not yet received this vaccine. Advised may also receive vaccine at local pharmacy or Health Dept. Verbalized acceptance and understanding.  Screening Tests Health Maintenance  Topic Date Due  . HEMOGLOBIN A1C  04/14/2020  . FOOT EXAM  04/19/2020  . COVID-19 Vaccine (4 - Booster for Pfizer series) 08/11/2020  . OPHTHALMOLOGY EXAM  04/27/2021  . COLONOSCOPY (Pts 45-44yrs Insurance coverage will need to be confirmed)  05/08/2021  . TETANUS/TDAP  10/05/2027  . INFLUENZA VACCINE  Completed  . Hepatitis C Screening  Completed  . PNA vac Low Risk Adult  Completed    Health  Maintenance  Health Maintenance Due  Topic Date Due  . HEMOGLOBIN A1C  04/14/2020  . FOOT EXAM  04/19/2020    Colorectal cancer screening: Type of screening: Colonoscopy. Completed 05/08/18. Repeat every 3 years  Lung Cancer Screening: (Low Dose CT Chest recommended if Age 4-80 years, 30 pack-year currently smoking OR have quit w/in 15years.) does qualify however had this completed 02/15/20. Repeat yearly.   Additional Screening:  Hepatitis C Screening: Up to date  Vision Screening: Recommended annual ophthalmology exams for early detection of glaucoma and other disorders of the eye. Is the patient up to date with their annual eye exam?  Yes  Who is the provider or what is the name of the office in which the patient attends annual eye exams? Dr Druscilla Brownie @ [facility] If pt is not established with a provider, would they like to be referred to a provider to establish care? No .   Dental Screening: Recommended annual dental exams for proper oral hygiene  Community Resource Referral / Chronic Care Management: CRR required this visit?  No   CCM required this visit?  No      Plan:     I have personally reviewed and noted the following in the patient's chart:   . Medical and social history . Use of alcohol, tobacco or illicit drugs  . Current medications and supplements . Functional ability and status . Nutritional status . Physical activity . Advanced directives . List of other physicians . Hospitalizations, surgeries, and ER visits in previous 12 months . Vitals . Screenings to include cognitive, depression, and falls . Referrals and appointments  In addition, I have reviewed and discussed with patient certain preventive  protocols, quality metrics, and best practice recommendations. A written personalized care plan for preventive services as well as general preventive health recommendations were provided to patient.     Larya Charpentier [place], [place]   11/09/8240   Nurse Notes: Pt needs  a Hgb A1c check and diabetic foot exam at today's in office apt.

## 2020-05-09 ENCOUNTER — Other Ambulatory Visit: Payer: Self-pay

## 2020-05-09 ENCOUNTER — Encounter: Payer: Self-pay | Admitting: Family Medicine

## 2020-05-09 ENCOUNTER — Ambulatory Visit (INDEPENDENT_AMBULATORY_CARE_PROVIDER_SITE_OTHER): Payer: Medicare Other

## 2020-05-09 ENCOUNTER — Ambulatory Visit (INDEPENDENT_AMBULATORY_CARE_PROVIDER_SITE_OTHER): Payer: Medicare Other | Admitting: Family Medicine

## 2020-05-09 VITALS — BP 128/81 | HR 64 | Temp 98.6°F | Ht 67.0 in | Wt 221.0 lb

## 2020-05-09 DIAGNOSIS — E785 Hyperlipidemia, unspecified: Secondary | ICD-10-CM

## 2020-05-09 DIAGNOSIS — E1129 Type 2 diabetes mellitus with other diabetic kidney complication: Secondary | ICD-10-CM | POA: Diagnosis not present

## 2020-05-09 DIAGNOSIS — F17200 Nicotine dependence, unspecified, uncomplicated: Secondary | ICD-10-CM

## 2020-05-09 DIAGNOSIS — Z0000 Encounter for general adult medical examination without abnormal findings: Secondary | ICD-10-CM

## 2020-05-09 DIAGNOSIS — E1169 Type 2 diabetes mellitus with other specified complication: Secondary | ICD-10-CM

## 2020-05-09 DIAGNOSIS — E669 Obesity, unspecified: Secondary | ICD-10-CM | POA: Diagnosis not present

## 2020-05-09 DIAGNOSIS — J449 Chronic obstructive pulmonary disease, unspecified: Secondary | ICD-10-CM

## 2020-05-09 DIAGNOSIS — N400 Enlarged prostate without lower urinary tract symptoms: Secondary | ICD-10-CM

## 2020-05-09 DIAGNOSIS — Z6834 Body mass index (BMI) 34.0-34.9, adult: Secondary | ICD-10-CM

## 2020-05-09 DIAGNOSIS — R809 Proteinuria, unspecified: Secondary | ICD-10-CM

## 2020-05-09 MED ORDER — FLUTICASONE PROPIONATE 50 MCG/ACT NA SUSP: 2.0000 | Freq: Every day | NASAL | 3 refills | Status: DC

## 2020-05-09 MED ORDER — LORATADINE 10 MG PO TABS: 10.0000 mg | ORAL_TABLET | Freq: Every day | ORAL | 3 refills | Status: DC

## 2020-05-09 MED ORDER — ATORVASTATIN CALCIUM 40 MG PO TABS: 40.0000 mg | ORAL_TABLET | Freq: Every day | ORAL | 3 refills | Status: DC

## 2020-05-09 MED ORDER — LISINOPRIL 5 MG PO TABS: 5.0000 mg | ORAL_TABLET | Freq: Every day | ORAL | 3 refills | Status: DC

## 2020-05-09 MED ORDER — MELOXICAM 15 MG PO TABS: 15.0000 mg | ORAL_TABLET | ORAL | 3 refills | Status: DC | PRN

## 2020-05-09 MED ORDER — METFORMIN HCL 500 MG PO TABS: 500.0000 mg | ORAL_TABLET | Freq: Every day | ORAL | 3 refills | Status: DC

## 2020-05-09 MED ORDER — TAMSULOSIN HCL 0.4 MG PO CAPS: 0.4000 mg | ORAL_CAPSULE | Freq: Every day | ORAL | 3 refills | Status: DC

## 2020-05-09 NOTE — Assessment & Plan Note (Signed)
Previously well controlled Continue statin Repeat FLP and CMP Goal LDL < 70 

## 2020-05-09 NOTE — Assessment & Plan Note (Signed)
Discussed importance of healthy weight management Discussed diet and exercise  

## 2020-05-09 NOTE — Assessment & Plan Note (Signed)
Continue lisinopril low dose

## 2020-05-09 NOTE — Assessment & Plan Note (Signed)
Well controlled with last A1c 6.1 Continue current medications UTD on vaccines, eye exam Foot exam completed today On ACEi On Statin Discussed diet and exercise Recheck A1c F/u in 6 months

## 2020-05-09 NOTE — Assessment & Plan Note (Signed)
Chronic and stable Not on any meds Encouraged tobacco cessation

## 2020-05-09 NOTE — Assessment & Plan Note (Signed)
Encouraged tobacco cessation Patient remains precontemplative

## 2020-05-09 NOTE — Progress Notes (Signed)
Annual Wellness Visit     Patient: David [name]., Male    DOB: [birth year], 75 y.o.   MRN: [id number] Visit Date: 05/09/2020  Today's Provider: Lavon Paganini, MD   Chief Complaint  Patient presents with  . Annual Exam   Subjective    David [name]. is a 75 y.o. male who presents today for his Annual Wellness Visit. He reports consuming a general diet. Exercising some. He generally feels well. He reports sleeping well. He does not have additional problems to discuss today.   HPI   Patient Active Problem List   Diagnosis Date Noted  . Caregiver stress 10/20/2017  . BPH (benign prostatic hyperplasia) 10/21/2014  . COPD, mild ([place]) 10/21/2014  . Type 2 diabetes mellitus with microalbuminuria, without long-term current use of insulin ([place]) 10/21/2014  . 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 Hyperlipidemia associated with type 2 diabetes mellitus ([place]) 10/21/2014  . Microalbuminuria due to type 2 diabetes mellitus ([place]) 10/21/2014  . Obesity 10/21/2014  . Allergic rhinitis 10/21/2014  . Apnea, sleep 10/21/2014  . Compulsive tobacco user syndrome 10/21/2014   Past Medical History:  Diagnosis Date  . Actinic keratosis 01/13/2017   R upper back post base of neck - biopsy proven   . Allergy   . COPD (chronic obstructive pulmonary disease) ([place])    misdiagnosed  . Depression   . Diabetes mellitus without complication ([place])   . Dysplastic nevus 12/23/2007   R med mid post thigh - slight atypia  . Dysplastic nevus 12/22/2008   L mid ant thigh - slight atypia  . Hyperlipidemia   . Hypertension   . Microalbuminuria   . Sleep apnea    Social History   Tobacco Use  . Smoking status: Current Every Day Smoker    Packs/day: 1.00    Years: 52.00    Pack years: 52.00    Types: Cigarettes  . Smokeless tobacco: Never Used  . Tobacco comment: Pt started smoking again. Is smoking 1.25-1.5 PPD  Vaping Use  . Vaping Use: Never used  Substance Use Topics  . Alcohol  use: No    Comment: Previous Heavy Alcohol Abuse  . Drug use: No   Allergies  Allergen Reactions  . Alcohol Other (See Comments)    Addiction     Medications: Outpatient Medications Prior to Visit  Medication Sig  . [DISCONTINUED] atorvastatin (LIPITOR) 40 MG tablet TAKE 1 TABLET BY MOUTH EVERY DAY  . [DISCONTINUED] fluticasone (FLONASE) 50 MCG/ACT nasal spray Place 2 sprays into both nostrils daily.  . [DISCONTINUED] lisinopril (ZESTRIL) 5 MG tablet TAKE 1 TABLET BY MOUTH EVERY DAY  . [DISCONTINUED] loratadine (CLARITIN) 10 MG tablet Take 10 mg by mouth daily.   . [DISCONTINUED] meloxicam (MOBIC) 15 MG tablet Take 1 tablet (15 mg total) by mouth as needed.  . [DISCONTINUED] metFORMIN (GLUCOPHAGE) 500 MG tablet TAKE 1 TABLET BY MOUTH EVERY DAY WITH BREAKFAST  . [DISCONTINUED] tamsulosin (FLOMAX) 0.4 MG CAPS capsule TAKE 1 CAPSULE BY MOUTH EVERY DAY   No facility-administered medications prior to visit.    Allergies  Allergen Reactions  . Alcohol Other (See Comments)    Addiction    Patient Care Team: Virginia Crews, MD as PCP - General (Family Medicine) Birder Robson, MD as Referring Physician (Ophthalmology) Ralene Bathe, MD (Dermatology)  Review of Systems  Constitutional: Negative.   HENT: Negative.   Eyes: Negative.   Respiratory: Negative.   Cardiovascular: Negative.   Gastrointestinal:  Negative.   Endocrine: Negative.   Genitourinary: Negative.   Musculoskeletal: Negative.   Skin: Negative.   Allergic/Immunologic: Negative.   Neurological: Negative.   Hematological: Negative.   Psychiatric/Behavioral: Negative.       Objective    Vitals: BP 128/81 (BP Location: Right Arm, Patient Position: Sitting, Cuff Size: Large)   Pulse 64   Temp 98.6 F (37 C) (Oral)   Ht 5\' 7"  (1.702 m)   Wt 221 lb (100.2 kg)   BMI 34.61 kg/m    Physical Exam Vitals reviewed.  Constitutional:      General: He is not in acute distress.    Appearance: Normal  appearance. He is well-developed. He is not diaphoretic.  HENT:     Head: Normocephalic and atraumatic.  Eyes:     General: No scleral icterus.    Conjunctiva/sclera: Conjunctivae normal.  Neck:     Thyroid: No thyromegaly.  Cardiovascular:     Rate and Rhythm: Normal rate and regular rhythm.     Pulses: Normal pulses.     Heart sounds: Normal heart sounds. No murmur heard.   Pulmonary:     Effort: Pulmonary effort is normal. No respiratory distress.     Breath sounds: Normal breath sounds. No wheezing or rales.  Abdominal:     General: There is no distension.     Palpations: Abdomen is soft.     Tenderness: There is no abdominal tenderness.  Musculoskeletal:        General: No deformity.     Cervical back: Neck supple.     Right lower leg: No edema.     Left lower leg: No edema.  Lymphadenopathy:     Cervical: No cervical adenopathy.  Skin:    General: Skin is warm and dry.     Findings: No rash.  Neurological:     Mental Status: He is alert and oriented to person, place, and time. Mental status is at baseline.     Sensory: No sensory deficit.     Motor: No weakness.     Gait: Gait normal.  Psychiatric:        Mood and Affect: Mood normal.        Behavior: Behavior normal.        Thought Content: Thought content normal.    Diabetic Foot Exam - Simple   Simple Foot Form Diabetic Foot exam was performed with the following findings: Yes 05/09/2020 10:57 AM  Visual Inspection No deformities, no ulcerations, no other skin breakdown bilaterally: Yes Sensation Testing Intact to touch and monofilament testing bilaterally: Yes Pulse Check Posterior Tibialis and Dorsalis pulse intact bilaterally: Yes Comments      Most recent functional status assessment: In your present state of health, do you have any difficulty performing the following activities: 05/09/2020  Hearing? N  Vision? N  Difficulty concentrating or making decisions? N  Walking or climbing stairs? N   Dressing or bathing? N  Doing errands, shopping? N  Preparing Food and eating ? N  Using the Toilet? N  In the past six months, have you accidently leaked urine? Y  Comment Occasionally  Do you have problems with loss of bowel control? N  Managing your Medications? N  Managing your Finances? N  Housekeeping or managing your Housekeeping? N  Some recent data might be hidden   Most recent fall risk assessment: Fall Risk  05/09/2020  Falls in the past year? 0  Number falls in past yr: 0  Injury with  Fall? 0    Most recent depression screenings: PHQ 2/9 Scores 05/09/2020 04/15/2019  PHQ - 2 Score 0 0  PHQ- 9 Score - -   Most recent cognitive screening: 6CIT Screen 04/15/2019  What Year? 0 points  What month? 0 points  What time? 0 points  Count back from 20 0 points  Months in reverse 0 points  Repeat phrase 0 points  Total Score 0   Most recent Audit-C alcohol use screening Alcohol Use Disorder Test (AUDIT) 05/09/2020  1. How often do you have a drink containing alcohol? 0  2. How many drinks containing alcohol do you have on a typical day when you are drinking? 0  3. How often do you have six or more drinks on one occasion? 0  AUDIT-C Score 0  Alcohol Brief Interventions/Follow-up AUDIT Score <7 follow-up not indicated   A score of 3 or more in women, and 4 or more in men indicates increased risk for alcohol abuse, EXCEPT if all of the points are from question 1   No results found for any visits on 05/09/20.  Assessment & Plan     Annual wellness visit done today including the all of the following: Reviewed patient's Family Medical History Reviewed and updated list of patient's medical providers Assessment of cognitive impairment was done Assessed patient's functional ability Established a written schedule for health screening [place] Completed and Reviewed  Exercise Activities and Dietary recommendations Goals    . DIET - REDUCE CALORIE  INTAKE     Recommend to continue current diet plan of cutting out sodas and junk food to help aid in weight loss.     . Exercise 3x per week (30 min per time)     Recommend to exercise for 3 days a week for at least 30 minutes at a time.     . Quit Smoking     Recommend to continue efforts to reduce smoking habits until no longer smoking.        Immunization History  Administered Date(s) Administered  . Fluad Quad(high Dose 65+) 02/02/2019, 02/23/2020  . Influenza Split 02/27/2012  . Influenza, High Dose Seasonal PF 02/19/2016, 01/15/2017, 01/21/2018  . Influenza,inj,Quad PF,6+ Mos 02/18/2014  . Influenza-Unspecified 03/07/2015  . PFIZER SARS-COV-2 Vaccination 05/28/2019, 06/18/2019, 02/11/2020  . Pneumococcal Conjugate-13 06/08/2014  . Pneumococcal Polysaccharide-23 12/16/2011  . Td 09/11/2004  . Tetanus 10/04/2017  . Zoster 02/27/2012  . Zoster Recombinat (Shingrix) 07/18/2019    Health Maintenance  Topic Date Due  . HEMOGLOBIN A1C  04/14/2020  . COVID-19 Vaccine (4 - Booster for Pfizer series) 08/11/2020  . OPHTHALMOLOGY EXAM  04/27/2021  . COLONOSCOPY (Pts 45-69yrs Insurance coverage will need to be confirmed)  05/08/2021  . FOOT EXAM  05/09/2021  . TETANUS/TDAP  10/05/2027  . INFLUENZA VACCINE  Completed  . Hepatitis C Screening  Completed  . PNA vac Low Risk Adult  Completed     Discussed health benefits of physical activity, and encouraged him to engage in regular exercise appropriate for his age and condition.    Problem List Items Addressed This Visit      Respiratory   COPD, mild ([place])    Chronic and stable Not on any meds Encouraged tobacco cessation      Relevant Medications   fluticasone (FLONASE) 50 MCG/ACT nasal spray   loratadine (CLARITIN) 10 MG tablet     Endocrine   Type 2 diabetes mellitus with microalbuminuria, without long-term current use of insulin (  [place])    Well controlled with last A1c 6.1 Continue current medications UTD on  vaccines, eye exam Foot exam completed today On ACEi On Statin Discussed diet and exercise Recheck A1c F/u in 6 months       Relevant Medications   atorvastatin (LIPITOR) 40 MG tablet   lisinopril (ZESTRIL) 5 MG tablet   metFORMIN (GLUCOPHAGE) 500 MG tablet   Other Relevant Orders   Hemoglobin A1c   Hyperlipidemia associated with type 2 diabetes mellitus ([facility])    Previously well controlled Continue statin Repeat FLP and CMP Goal LDL < 70      Relevant Medications   atorvastatin (LIPITOR) 40 MG tablet   lisinopril (ZESTRIL) 5 MG tablet   metFORMIN (GLUCOPHAGE) 500 MG tablet   Other Relevant Orders   Lipid panel   Comprehensive metabolic panel   Microalbuminuria due to type 2 diabetes mellitus ([facility])    Continue lisinopril low dose      Relevant Medications   atorvastatin (LIPITOR) 40 MG tablet   lisinopril (ZESTRIL) 5 MG tablet   metFORMIN (GLUCOPHAGE) 500 MG tablet     Genitourinary   BPH (benign prostatic hyperplasia)    Chronic and well controlled Continue Flomax      Relevant Medications   tamsulosin (FLOMAX) 0.4 MG CAPS capsule     Other   Obesity    Discussed importance of healthy weight management Discussed diet and exercise       Relevant Medications   metFORMIN (GLUCOPHAGE) 500 MG tablet   Other Relevant Orders   Lipid panel   Comprehensive metabolic panel   Hemoglobin A1c   Compulsive tobacco user syndrome    Encouraged tobacco cessation Patient remains precontemplative       Other Visit Diagnoses    Encounter for annual physical exam    -  Primary   Relevant Orders   Lipid panel   Comprehensive metabolic panel   Hemoglobin A1c   Diabetes mellitus type 2 in obese (HCC)       Relevant Medications   atorvastatin (LIPITOR) 40 MG tablet   lisinopril (ZESTRIL) 5 MG tablet   metFORMIN (GLUCOPHAGE) 500 MG tablet       Return in about 6 months (around 11/06/2020) for chronic disease f/u.     I, Lavon Paganini, MD, have reviewed all  documentation for this visit. The documentation on 05/09/20 for the exam, diagnosis, procedures, and orders are all accurate and complete.   Dottie Vaquerano, Dionne Bucy, MD, MPH [facility]

## 2020-05-09 NOTE — Assessment & Plan Note (Signed)
Chronic and well controlled Continue Flomax

## 2020-05-09 NOTE — Patient Instructions (Signed)
Remember 2nd Shingrix dose   Preventive Care 76 Years and Older, Male Preventive care refers to lifestyle choices and visits with your health care provider that can promote health and wellness. This includes:  A yearly physical exam. This is also called an annual well check.  Regular dental and eye exams.  Immunizations.  Screening for certain conditions.  Healthy lifestyle choices, such as diet and exercise. What can I expect for my preventive care visit? Physical exam Your health care provider will check:  Height and weight. These may be used to calculate body mass index (BMI), which is a measurement that tells if you are at a healthy weight.  Heart rate and blood pressure.  Your skin for abnormal spots. Counseling Your health care provider may ask you questions about:  Alcohol, tobacco, and drug use.  Emotional well-being.  Home and relationship well-being.  Sexual activity.  Eating habits.  History of falls.  Memory and ability to understand (cognition).  Work and work Statistician. What immunizations do I need?  Influenza (flu) vaccine  This is recommended every year. Tetanus, diphtheria, and pertussis (Tdap) vaccine  You may need a Td booster every 10 years. Varicella (chickenpox) vaccine  You may need this vaccine if you have not already been vaccinated. Zoster (shingles) vaccine  You may need this after age 42. Pneumococcal conjugate (PCV13) vaccine  One dose is recommended after age 42. Pneumococcal polysaccharide (PPSV23) vaccine  One dose is recommended after age 27. Measles, mumps, and rubella (MMR) vaccine  You may need at least one dose of MMR if you were born in 1957 or later. You may also need a second dose. Meningococcal conjugate (MenACWY) vaccine  You may need this if you have certain conditions. Hepatitis A vaccine  You may need this if you have certain conditions or if you travel or work in places where you may be exposed to  hepatitis A. Hepatitis B vaccine  You may need this if you have certain conditions or if you travel or work in places where you may be exposed to hepatitis B. Haemophilus influenzae type b (Hib) vaccine  You may need this if you have certain conditions. You may receive vaccines as individual doses or as more than one vaccine together in one shot (combination vaccines). Talk with your health care provider about the risks and benefits of combination vaccines. What tests do I need? Blood tests  Lipid and cholesterol levels. These may be checked every 5 years, or more frequently depending on your overall health.  Hepatitis C test.  Hepatitis B test. Screening  Lung cancer screening. You may have this screening every year starting at age 26 if you have a 30-pack-year history of smoking and currently smoke or have quit within the past 15 years.  Colorectal cancer screening. All adults should have this screening starting at age 27 and continuing until age 57. Your health care provider may recommend screening at age 76 if you are at increased risk. You will have tests every 1-10 years, depending on your results and the type of screening test.  Prostate cancer screening. Recommendations will vary depending on your family history and other risks.  Diabetes screening. This is done by checking your blood sugar (glucose) after you have not eaten for a while (fasting). You may have this done every 1-3 years.  Abdominal aortic aneurysm (AAA) screening. You may need this if you are a current or former smoker.  Sexually transmitted disease (STD) testing. Follow these instructions at  home: Eating and drinking  Eat a diet that includes fresh fruits and vegetables, whole grains, lean protein, and low-fat dairy products. Limit your intake of foods with high amounts of sugar, saturated fats, and salt.  Take vitamin and mineral supplements as recommended by your health care provider.  Do not drink  alcohol if your health care provider tells you not to drink.  If you drink alcohol: ? Limit how much you have to 0-2 drinks a day. ? Be aware of how much alcohol is in your drink. In the [place] one drink equals one 12 oz bottle of beer (355 mL), one 5 oz glass of wine (148 mL), or one 1 oz glass of hard liquor (44 mL). Lifestyle  Take daily care of your teeth and gums.  Stay active. Exercise for at least 30 minutes on 5 or more days each week.  Do not use any products that contain nicotine or tobacco, such as cigarettes, e-cigarettes, and chewing tobacco. If you need help quitting, ask your health care provider.  If you are sexually active, practice safe sex. Use a condom or other form of protection to prevent STIs (sexually transmitted infections).  Talk with your health care provider about taking a low-dose aspirin or statin. What's next?  Visit your health care provider once a year for a well check visit.  Ask your health care provider how often you should have your eyes and teeth checked.  Stay up to date on all vaccines. This information is not intended to replace advice given to you by your health care provider. Make sure you discuss any questions you have with your health care provider. Document Revised: 04/16/2018 Document Reviewed: 04/16/2018 Elsevier Patient Education  2020 [employer].

## 2020-05-09 NOTE — Patient Instructions (Signed)
[name] , Thank you for taking time to come for your Medicare Wellness Visit. I appreciate your ongoing commitment to your health goals. Please review the following plan we discussed and let me know if I can assist you in the future.   Screening recommendations/referrals: Colonoscopy: Up to date, due 05/2021  Recommended yearly ophthalmology/optometry visit for glaucoma screening and checkup Recommended yearly dental visit for hygiene and checkup  Vaccinations: Influenza vaccine: Done 02/23/20 Pneumococcal vaccine: Completed series Tdap vaccine: Up to date, due 10/2027 Shingles vaccine: Shingrix discussed. Please contact your pharmacy for coverage information for the second dose.    Advanced directives: Please bring a copy of your POA (Power of Attorney) and/or Living Will to your next appointment.   Conditions/risks identified: Smoking cessation discussed today. Recommend to start exercising 3 days a week for at least 30 minutes at a time.   Next appointment: 10:20 AM today with Dr Beryle Flock  Preventive Care 76 Years and Older, Male Preventive care refers to lifestyle choices and visits with your health care provider that can promote health and wellness. What does preventive care include?  A yearly physical exam. This is also called an annual well check.  Dental exams once or twice a year.  Routine eye exams. Ask your health care provider how often you should have your eyes checked.  Personal lifestyle choices, including:  Daily care of your teeth and gums.  Regular physical activity.  Eating a healthy diet.  Avoiding tobacco and drug use.  Limiting alcohol use.  Practicing safe sex.  Taking low doses of aspirin every day.  Taking vitamin and mineral supplements as recommended by your health care provider. What happens during an annual well check? The services and screenings done by your health care provider during your annual well check will depend on your age,  overall health, lifestyle risk factors, and family history of disease. Counseling  Your health care provider may ask you questions about your:  Alcohol use.  Tobacco use.  Drug use.  Emotional well-being.  Home and relationship well-being.  Sexual activity.  Eating habits.  History of falls.  Memory and ability to understand (cognition).  Work and work Astronomer. Screening  You may have the following tests or measurements:  Height, weight, and BMI.  Blood pressure.  Lipid and cholesterol levels. These may be checked every 5 years, or more frequently if you are over 42 years old.  Skin check.  Lung cancer screening. You may have this screening every year starting at age 66 if you have a 30-pack-year history of smoking and currently smoke or have quit within the past 15 years.  Fecal occult blood test (FOBT) of the stool. You may have this test every year starting at age 21.  Flexible sigmoidoscopy or colonoscopy. You may have a sigmoidoscopy every 5 years or a colonoscopy every 10 years starting at age 80.  Prostate cancer screening. Recommendations will vary depending on your family history and other risks.  Hepatitis C blood test.  Hepatitis B blood test.  Sexually transmitted disease (STD) testing.  Diabetes screening. This is done by checking your blood sugar (glucose) after you have not eaten for a while (fasting). You may have this done every 1-3 years.  Abdominal aortic aneurysm (AAA) screening. You may need this if you are a current or former smoker.  Osteoporosis. You may be screened starting at age 62 if you are at high risk. Talk with your health care provider about your test results, treatment  options, and if necessary, the need for more tests. Vaccines  Your health care provider may recommend certain vaccines, such as:  Influenza vaccine. This is recommended every year.  Tetanus, diphtheria, and acellular pertussis (Tdap, Td) vaccine. You may  need a Td booster every 10 years.  Zoster vaccine. You may need this after age 37.  Pneumococcal 13-valent conjugate (PCV13) vaccine. One dose is recommended after age 69.  Pneumococcal polysaccharide (PPSV23) vaccine. One dose is recommended after age 28. Talk to your health care provider about which screenings and vaccines you need and how often you need them. This information is not intended to replace advice given to you by your health care provider. Make sure you discuss any questions you have with your health care provider. Document Released: 05/19/2015 Document Revised: 01/10/2016 Document Reviewed: 02/21/2015 Elsevier Interactive Patient Education  2017 [employer] Prevention in the Home Falls can cause injuries. They can happen to people of all ages. There are many things you can do to make your home safe and to help prevent falls. What can I do on the outside of my home?  Regularly fix the edges of walkways and driveways and fix any cracks.  Remove anything that might make you trip as you walk through a door, such as a raised step or threshold.  Trim any bushes or trees on the path to your home.  Use bright outdoor lighting.  Clear any walking paths of anything that might make someone trip, such as rocks or tools.  Regularly check to see if handrails are loose or broken. Make sure that both sides of any steps have handrails.  Any raised decks and porches should have guardrails on the edges.  Have any leaves, snow, or ice cleared regularly.  Use sand or salt on walking paths during winter.  Clean up any spills in your garage right away. This includes oil or grease spills. What can I do in the bathroom?  Use night lights.  Install grab bars by the toilet and in the tub and shower. Do not use towel bars as grab bars.  Use non-skid mats or decals in the tub or shower.  If you need to sit down in the shower, use a plastic, non-slip stool.  Keep the floor  dry. Clean up any water that spills on the floor as soon as it happens.  Remove soap buildup in the tub or shower regularly.  Attach bath mats securely with double-sided non-slip rug tape.  Do not have throw rugs and other things on the floor that can make you trip. What can I do in the bedroom?  Use night lights.  Make sure that you have a light by your bed that is easy to reach.  Do not use any sheets or blankets that are too big for your bed. They should not hang down onto the floor.  Have a firm chair that has side arms. You can use this for support while you get dressed.  Do not have throw rugs and other things on the floor that can make you trip. What can I do in the kitchen?  Clean up any spills right away.  Avoid walking on wet floors.  Keep items that you use a lot in easy-to-reach places.  If you need to reach something above you, use a strong step stool that has a grab bar.  Keep electrical cords out of the way.  Do not use floor polish or wax that makes floors slippery.  If you must use wax, use non-skid floor wax.  Do not have throw rugs and other things on the floor that can make you trip. What can I do with my stairs?  Do not leave any items on the stairs.  Make sure that there are handrails on both sides of the stairs and use them. Fix handrails that are broken or loose. Make sure that handrails are as long as the stairways.  Check any carpeting to make sure that it is firmly attached to the stairs. Fix any carpet that is loose or worn.  Avoid having throw rugs at the top or bottom of the stairs. If you do have throw rugs, attach them to the floor with carpet tape.  Make sure that you have a light switch at the top of the stairs and the bottom of the stairs. If you do not have them, ask someone to add them for you. What else can I do to help prevent falls?  Wear shoes that:  Do not have high heels.  Have rubber bottoms.  Are comfortable and fit you  well.  Are closed at the toe. Do not wear sandals.  If you use a stepladder:  Make sure that it is fully opened. Do not climb a closed stepladder.  Make sure that both sides of the stepladder are locked into place.  Ask someone to hold it for you, if possible.  Clearly mark and make sure that you can see:  Any grab bars or handrails.  First and last steps.  Where the edge of each step is.  Use tools that help you move around (mobility aids) if they are needed. These include:  Canes.  Walkers.  Scooters.  Crutches.  Turn on the lights when you go into a dark area. Replace any light bulbs as soon as they burn out.  Set up your furniture so you have a clear path. Avoid moving your furniture around.  If any of your floors are uneven, fix them.  If there are any pets around you, be aware of where they are.  Review your medicines with your doctor. Some medicines can make you feel dizzy. This can increase your chance of falling. Ask your doctor what other things that you can do to help prevent falls. This information is not intended to replace advice given to you by your health care provider. Make sure you discuss any questions you have with your health care provider. Document Released: 02/16/2009 Document Revised: 09/28/2015 Document Reviewed: 05/27/2014 Elsevier Interactive Patient Education  2017 [employer].

## 2020-05-10 ENCOUNTER — Other Ambulatory Visit: Payer: Self-pay

## 2020-05-10 ENCOUNTER — Ambulatory Visit: Payer: Medicare Other | Admitting: Dermatology

## 2020-05-10 ENCOUNTER — Encounter: Payer: Self-pay | Admitting: Dermatology

## 2020-05-10 ENCOUNTER — Encounter: Payer: Medicare Other | Admitting: Dermatology

## 2020-05-10 DIAGNOSIS — L570 Actinic keratosis: Secondary | ICD-10-CM | POA: Diagnosis not present

## 2020-05-10 DIAGNOSIS — L814 Other melanin hyperpigmentation: Secondary | ICD-10-CM

## 2020-05-10 DIAGNOSIS — D692 Other nonthrombocytopenic purpura: Secondary | ICD-10-CM

## 2020-05-10 DIAGNOSIS — D225 Melanocytic nevi of trunk: Secondary | ICD-10-CM | POA: Diagnosis not present

## 2020-05-10 DIAGNOSIS — Z85820 Personal history of malignant melanoma of skin: Secondary | ICD-10-CM

## 2020-05-10 DIAGNOSIS — D1800 Hemangioma unspecified site: Secondary | ICD-10-CM

## 2020-05-10 DIAGNOSIS — Z1283 Encounter for screening for malignant neoplasm of skin: Secondary | ICD-10-CM | POA: Diagnosis not present

## 2020-05-10 DIAGNOSIS — L918 Other hypertrophic disorders of the skin: Secondary | ICD-10-CM

## 2020-05-10 DIAGNOSIS — L578 Other skin changes due to chronic exposure to nonionizing radiation: Secondary | ICD-10-CM | POA: Diagnosis not present

## 2020-05-10 DIAGNOSIS — D229 Melanocytic nevi, unspecified: Secondary | ICD-10-CM

## 2020-05-10 DIAGNOSIS — L821 Other seborrheic keratosis: Secondary | ICD-10-CM

## 2020-05-10 DIAGNOSIS — D485 Neoplasm of uncertain behavior of skin: Secondary | ICD-10-CM

## 2020-05-10 LAB — COMPREHENSIVE METABOLIC PANEL
ALT: 28 IU/L (ref 0–44)
AST: 18 IU/L (ref 0–40)
Albumin/Globulin Ratio: 2.3 — ABNORMAL HIGH (ref 1.2–2.2)
Albumin: 4.4 g/dL (ref 3.7–4.7)
Alkaline Phosphatase: 95 IU/L (ref 44–121)
BUN/Creatinine Ratio: 14 (ref 10–24)
BUN: 14 mg/dL (ref 8–27)
Bilirubin Total: 0.5 mg/dL (ref 0.0–1.2)
CO2: 26 mmol/L (ref 20–29)
Calcium: 9.7 mg/dL (ref 8.6–10.2)
Chloride: 104 mmol/L (ref 96–106)
Creatinine, Ser: 1.02 mg/dL (ref 0.76–1.27)
GFR calc Af Amer: 83 mL/min/{1.73_m2} (ref >59)
GFR calc non Af Amer: 72 mL/min/{1.73_m2} (ref >59)
Globulin, Total: 1.9 g/dL (ref 1.5–4.5)
Glucose: 152 mg/dL — ABNORMAL HIGH (ref 65–99)
Potassium: 5.2 mmol/L (ref 3.5–5.2)
Sodium: 142 mmol/L (ref 134–144)
Total Protein: 6.3 g/dL (ref 6.0–8.5)

## 2020-05-10 LAB — LIPID PANEL
Chol/HDL Ratio: 5.2 ratio — ABNORMAL HIGH (ref 0.0–5.0)
Cholesterol, Total: 157 mg/dL (ref 100–199)
HDL: 30 mg/dL — ABNORMAL LOW (ref >39)
LDL Chol Calc (NIH): 89 mg/dL (ref 0–99)
Triglycerides: 226 mg/dL — ABNORMAL HIGH (ref 0–149)
VLDL Cholesterol Cal: 38 mg/dL (ref 5–40)

## 2020-05-10 LAB — HEMOGLOBIN A1C
Est. average glucose Bld gHb Est-mCnc: 151 mg/dL
Hgb A1c MFr Bld: 6.9 % — ABNORMAL HIGH (ref 4.8–5.6)

## 2020-05-10 NOTE — Progress Notes (Signed)
Follow-Up Visit   Subjective  David [name]. is a 75 y.o. male who presents for the following: Annual Exam (Hx MM of the clavicle in 1988). The patient presents for Total-Body Skin Exam (TBSE) for skin cancer screening and mole check.  The following portions of the chart were reviewed this encounter and updated as appropriate:   Tobacco  Allergies  Meds  Problems  Med Hx  Surg Hx  Fam Hx     Review of Systems:  No other skin or systemic complaints except as noted in HPI or Assessment and Plan.  Objective  Well appearing patient in no apparent distress; mood and affect are within normal limits.  A full examination was performed including scalp, head, eyes, ears, nose, lips, neck, chest, axillae, abdomen, back, buttocks, bilateral upper extremities, bilateral lower extremities, hands, feet, fingers, toes, fingernails, and toenails. All findings within normal limits unless otherwise noted below.  Objective  Dorsum nose x 1: Erythematous thin papules/macules with gritty scale.   Images    Objective  RUQA lat: 0.6 cm irregular brown macule       Assessment & Plan  AK (actinic keratosis) Dorsum nose x 1 (if persistent on follow up, will consider biopsy) - recheck 2 mos. Destruction of lesion - Dorsum nose x 1 Complexity: simple   Destruction method: cryotherapy   Informed consent: discussed and consent obtained   Timeout:  patient name, date of birth, surgical site, and procedure verified Lesion destroyed using liquid nitrogen: Yes   Region frozen until ice ball extended beyond lesion: Yes   Outcome: patient tolerated procedure well with no complications   Post-procedure details: wound care instructions given    Neoplasm of uncertain behavior of skin RUQA lat  Epidermal / dermal shaving  Lesion diameter (cm):  0.6 Informed consent: discussed and consent obtained   Timeout: patient name, date of birth, surgical site, and procedure verified   Patient was  prepped and draped in usual sterile fashion: area prepped with alcohol. Anesthesia: the lesion was anesthetized in a standard fashion   Anesthetic:  1% lidocaine w/ epinephrine 1-100,000 buffered w/ 8.4% NaHCO3 Instrument used: flexible razor blade   Hemostasis achieved with: pressure, aluminum chloride and electrodesiccation   Outcome: patient tolerated procedure well   Post-procedure details: wound care instructions given   Post-procedure details comment:  Ointment and small bandage applied  Specimen 1 - Surgical pathology Differential Diagnosis: D485 r/o dysplastic nevus, hx MM Check Margins: No 0.6 cm irregular brown macule  Skin cancer screening  Lentigines - Scattered tan macules - Discussed due to sun exposure - Benign, observe - Call for any changes  Seborrheic Keratoses - Stuck-on, waxy, tan-brown papules and plaques  - Discussed benign etiology and prognosis. - Observe - Call for any changes  Melanocytic Nevi - Tan-brown and/or pink-flesh-colored symmetric macules and papules - Benign appearing on exam today - Observation - Call clinic for new or changing moles - Recommend daily use of broad spectrum spf 30+ sunscreen to sun-exposed areas.   Hemangiomas - Red papules - Discussed benign nature - Observe - Call for any changes  Actinic Damage - Chronic, secondary to cumulative UV/sun exposure - diffuse scaly erythematous macules with underlying dyspigmentation - Recommend daily broad spectrum sunscreen SPF 30+ to sun-exposed areas, reapply every 2 hours as needed.  - Call for new or changing lesions.  History of Melanoma - No evidence of recurrence today - No lymphadenopathy - Recommend regular full body skin exams - Recommend  daily broad spectrum sunscreen SPF 30+ to sun-exposed areas, reapply every 2 hours as needed.  - Call if any new or changing lesions are noted between office visits  Purpura - Chronic; persistent and recurrent.  Treatable, but not  curable. - Violaceous macules and patches - Benign - Related to trauma, age, sun damage and/or use of blood thinners, chronic use of topical and/or oral steroids - Observe - Can use OTC arnica containing moisturizer such as Dermend Bruise Formula if desired - Call for worsening or other concerns  Acrochordons (Skin Tags) - Fleshy, skin-colored pedunculated papules - Benign appearing.  - Observe. - If desired, they can be removed with an in office procedure that is not covered by insurance. - Please call the clinic if you notice any new or changing lesions.  Skin cancer screening performed today.  Return in about 2 months (around 07/08/2020) for AK recheck .  David [name], CMA, am acting as scribe for Sarina Ser, MD .  Documentation: I have reviewed the above documentation for accuracy and completeness, and I agree with the above.  Sarina Ser, MD

## 2020-05-11 ENCOUNTER — Telehealth: Payer: Self-pay

## 2020-05-11 NOTE — Telephone Encounter (Signed)
-----   Message from Erasmo Downer, MD sent at 05/10/2020 10:22 AM EST ----- Normal kidney function, liver function, electrolytes.  Cholesterol is slightly higher than it has been in the past.  A1c is also elevated, but still at goal.  Goal LDL <70, so want to get cholesterol back down. Dietary indiscretions over the holidays may be to blame.  Work on diet and exercise.  We won't change medications today, but we'll recheck at next visit.

## 2020-05-11 NOTE — Telephone Encounter (Signed)
Pt called and verbalized understanding of information below, regarding his lab results.

## 2020-05-15 ENCOUNTER — Telehealth: Payer: Self-pay

## 2020-05-15 NOTE — Telephone Encounter (Signed)
Left message on voicemail to return my call.  

## 2020-05-15 NOTE — Telephone Encounter (Signed)
Patient informed of pathology results and surgery scheduled. 

## 2020-05-15 NOTE — Telephone Encounter (Signed)
-----   Message from David C Kowalski, MD sent at 05/11/2020  5:23 PM EST ----- Diagnosis Skin , RUQA lat DYSPLASTIC JUNCTIONAL NEVUS WITH SEVERE ATYPIA  Severe dysplastic Schedule surgery 

## 2020-05-15 NOTE — Telephone Encounter (Signed)
-----   Message from Ralene Bathe, MD sent at 05/11/2020  5:23 PM EST ----- Diagnosis Skin , RUQA lat DYSPLASTIC JUNCTIONAL NEVUS WITH SEVERE ATYPIA  Severe dysplastic Schedule surgery

## 2020-05-27 ENCOUNTER — Other Ambulatory Visit: Payer: Self-pay | Admitting: Family Medicine

## 2020-05-27 DIAGNOSIS — E1169 Type 2 diabetes mellitus with other specified complication: Secondary | ICD-10-CM

## 2020-05-31 ENCOUNTER — Other Ambulatory Visit: Payer: Self-pay | Admitting: Family Medicine

## 2020-06-27 ENCOUNTER — Other Ambulatory Visit: Payer: Self-pay | Admitting: Family Medicine

## 2020-06-29 DIAGNOSIS — G4733 Obstructive sleep apnea (adult) (pediatric): Secondary | ICD-10-CM | POA: Diagnosis not present

## 2020-07-05 ENCOUNTER — Ambulatory Visit: Payer: Medicare Other | Admitting: Dermatology

## 2020-07-11 ENCOUNTER — Ambulatory Visit: Payer: Medicare Other | Admitting: Dermatology

## 2020-07-11 ENCOUNTER — Encounter: Payer: Self-pay | Admitting: Dermatology

## 2020-07-11 ENCOUNTER — Other Ambulatory Visit: Payer: Self-pay

## 2020-07-11 DIAGNOSIS — D239 Other benign neoplasm of skin, unspecified: Secondary | ICD-10-CM

## 2020-07-11 DIAGNOSIS — D492 Neoplasm of unspecified behavior of bone, soft tissue, and skin: Secondary | ICD-10-CM | POA: Diagnosis not present

## 2020-07-11 DIAGNOSIS — D235 Other benign neoplasm of skin of trunk: Secondary | ICD-10-CM

## 2020-07-11 DIAGNOSIS — L988 Other specified disorders of the skin and subcutaneous tissue: Secondary | ICD-10-CM | POA: Diagnosis not present

## 2020-07-11 MED ORDER — MUPIROCIN 2 % EX OINT: 1.0000 "application " | TOPICAL_OINTMENT | Freq: Every day | CUTANEOUS | 1 refills | Status: DC

## 2020-07-11 NOTE — Progress Notes (Unsigned)
   Follow-Up Visit   Subjective  David [name]. is a 75 y.o. male who presents for the following: severe dysplastic nevus bx proven (RUQA lateral, pt presents for excision) and Actinic Keratosis (Dorsum nose, recheck, LN2 05/10/20).  The following portions of the chart were reviewed this encounter and updated as appropriate:   Tobacco  Allergies  Meds  Problems  Med Hx  Surg Hx  Fam Hx     Review of Systems:  No other skin or systemic complaints except as noted in HPI or Assessment and Plan.  Objective  Well appearing patient in no apparent distress; mood and affect are within normal limits.  A focused examination was performed including abdomen. Relevant physical exam findings are noted in the Assessment and Plan.  Objective  RUQA lateral: Pink bx site 1.2 x 0.9  Objective  dorsum nose: Atrophic plaque   Assessment & Plan  Dysplastic nevus RUQA lateral  Severe Dysplastic Nevus bx proven, excised today  Start Mupirocin oint qd with dressing changes  Skin excision - RUQA lateral  Lesion length (cm):  1.2 Lesion width (cm):  0.9 Margin per side (cm):  0.2 Total excision diameter (cm):  1.6 Informed consent: discussed and consent obtained   Timeout: patient name, date of birth, surgical site, and procedure verified   Procedure prep:  Patient was prepped and draped in usual sterile fashion Prep type:  Isopropyl alcohol and povidone-iodine Anesthesia: the lesion was anesthetized in a standard fashion   Anesthetic:  1% lidocaine w/ epinephrine 1-100,000 buffered w/ 8.4% NaHCO3 (13cc) Instrument used: #15 blade   Hemostasis achieved with: pressure   Hemostasis achieved with comment:  Electrocautery Outcome: patient tolerated procedure well with no complications   Dressing: Mupirocin.    Skin repair - RUQA lateral Complexity:  Complex Final length (cm):  5 Reason for type of repair: reduce tension to allow closure, reduce the risk of dehiscence, infection, and  necrosis, reduce subcutaneous dead space and avoid a hematoma, allow closure of the large defect, preserve normal anatomy, preserve normal anatomical and functional relationships and enhance both functionality and cosmetic results   Undermining: area extensively undermined   Undermining comment:  Undermining Defect 1.6cm Subcutaneous layers (deep stitches):  Suture size:  2-0 Suture type: Vicryl (polyglactin 910)   Subcutaneous suture technique: Inverted Dermal. Fine/surface layer approximation (top stitches):  Suture size:  2-0 Suture type: nylon   Stitches: simple running   Suture removal (days):  7 Hemostasis achieved with: pressure Outcome: patient tolerated procedure well with no complications   Post-procedure details: sterile dressing applied and wound care instructions given   Dressing type: bandage, pressure dressing and bacitracin (Mupirocin)    mupirocin ointment (BACTROBAN) 2 % - RUQA lateral  Specimen 1 - Surgical pathology Differential Diagnosis: Bx proven Severe Dysplastic Nevus Check Margins: yes Pink bx site 1.2 x 0.9cm [id number]  Neoplasm of skin dorsum nose  R/O skin ca, plan bx on exam  Return in about 1 week (around 07/18/2020) for suture removal, bx of dorsum nose.  I, Othelia Pulling, RMA, am acting as scribe for Sarina Ser, MD .  Documentation: I have reviewed the above documentation for accuracy and completeness, and I agree with the above.  Sarina Ser, MD

## 2020-07-11 NOTE — Patient Instructions (Signed)

## 2020-07-12 ENCOUNTER — Encounter: Payer: Self-pay | Admitting: Dermatology

## 2020-07-12 ENCOUNTER — Telehealth: Payer: Self-pay

## 2020-07-12 NOTE — Telephone Encounter (Signed)
Patient doing fine after yesterdays surgery./sh 

## 2020-07-13 ENCOUNTER — Encounter: Payer: Self-pay | Admitting: Dermatology

## 2020-07-18 ENCOUNTER — Ambulatory Visit (INDEPENDENT_AMBULATORY_CARE_PROVIDER_SITE_OTHER): Payer: Medicare Other | Admitting: Dermatology

## 2020-07-18 ENCOUNTER — Other Ambulatory Visit: Payer: Self-pay

## 2020-07-18 DIAGNOSIS — Z4802 Encounter for removal of sutures: Secondary | ICD-10-CM

## 2020-07-18 DIAGNOSIS — D235 Other benign neoplasm of skin of trunk: Secondary | ICD-10-CM

## 2020-07-18 DIAGNOSIS — D239 Other benign neoplasm of skin, unspecified: Secondary | ICD-10-CM

## 2020-07-18 DIAGNOSIS — C4492 Squamous cell carcinoma of skin, unspecified: Secondary | ICD-10-CM

## 2020-07-18 DIAGNOSIS — D485 Neoplasm of uncertain behavior of skin: Secondary | ICD-10-CM

## 2020-07-18 HISTORY — DX: Squamous cell carcinoma of skin, unspecified: C44.92

## 2020-07-18 NOTE — Patient Instructions (Signed)

## 2020-07-18 NOTE — Progress Notes (Signed)
   Follow-Up Visit   Subjective  David Macon. is a 75 y.o. male who presents for the following: Follow-up (Post op Severe dysplastic nevus margins free of right UQA lat) and Other (Atrophic plaque of nasal tip - Biopsy today - History of LN2 treatment in the past).    The following portions of the chart were reviewed this encounter and updated as appropriate:       Review of Systems:  No other skin or systemic complaints except as noted in HPI or Assessment and Plan.  Objective  Well appearing patient in no apparent distress; mood and affect are within normal limits.  A focused examination was performed including face, abdomen. Relevant physical exam findings are noted in the Assessment and Plan.  Objective  Right UQA lat: Healing excision site  Objective  Nasal tip: 51mm depressed yellow flesh macule   Assessment & Plan  Dysplastic nevus Right UQA lat  Encounter for Removal of Sutures - Incision site at the right UQA lat is clean, dry and intact - Wound cleansed, sutures removed, wound cleansed and steri strips applied.  - Discussed pathology results showing severe dysplastic nevus margins free  - Patient advised to keep steri-strips dry until they fall off. - Scars remodel for a full year. - Once steri-strips fall off, patient can apply over-the-counter silicone scar cream each night to help with scar remodeling if desired. - Patient advised to call with any concerns or if they notice any new or changing lesions.   Neoplasm of uncertain behavior of skin Nasal tip  Skin / nail biopsy Type of biopsy: punch   Informed consent: discussed and consent obtained   Patient was prepped and draped in usual sterile fashion: Area prepped with alcohol. Anesthesia: the lesion was anesthetized in a standard fashion   Anesthetic:  1% lidocaine w/ epinephrine 1-100,000 buffered w/ 8.4% NaHCO3 Punch biopsy size: 1.5 mm. Suture size:  6-0 Suture type: nylon   Hemostasis  achieved with: suture and pressure   Outcome: patient tolerated procedure well   Post-procedure details: wound care instructions given   Post-procedure details comment:  Ointment and small bandage applied  Specimen 1 - Surgical pathology Differential Diagnosis: Sebaceous hyperplasia vs scar R/O BCC Check Margins: No 4 mm depressed yellow flesh macule  Return in about 6 days (around 07/24/2020).   I, Ashok Cordia, CMA, am acting as scribe for Brendolyn Patty, MD .  Documentation: I have reviewed the above documentation for accuracy and completeness, and I agree with the above.  Brendolyn Patty MD

## 2020-07-24 ENCOUNTER — Other Ambulatory Visit: Payer: Self-pay

## 2020-07-24 ENCOUNTER — Ambulatory Visit: Payer: Medicare Other | Admitting: Dermatology

## 2020-07-24 ENCOUNTER — Encounter: Payer: Self-pay | Admitting: Dermatology

## 2020-07-24 DIAGNOSIS — C44321 Squamous cell carcinoma of skin of nose: Secondary | ICD-10-CM | POA: Diagnosis not present

## 2020-07-24 DIAGNOSIS — L989 Disorder of the skin and subcutaneous tissue, unspecified: Secondary | ICD-10-CM

## 2020-07-24 NOTE — Progress Notes (Signed)
   Follow-Up Visit   Subjective  David [name]. is a 75 y.o. male who presents for the following: Follow-up (Patient here for 6 day biopsy follow-up/suture removal. Nasal tip with Atypical Squamous Proliferation with Desmoplasia.).  Cannot r/out  Invasive SCC, so recommend excision   The following portions of the chart were reviewed this encounter and updated as appropriate:       Review of Systems:  No other skin or systemic complaints except as noted in HPI or Assessment and Plan.  Objective  Well appearing patient in no apparent distress; mood and affect are within normal limits.  A focused examination was performed including face. Relevant physical exam findings are noted in the Assessment and Plan.  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  - Discussed pathology results showing Atypical Squamous Proliferation with Desmoplasia. The atypical squmaous nests are concerning for invasive squamous cell carcinoma and re-excision is recommended as the lesion is atypical and there is a background of solar elastosis.  Discussed MOHs surgery with patient. Will refer to [facility] in [place].    Return if symptoms worsen or fail to improve.   IJamesetta Orleans, CMA, am acting as scribe for Brendolyn Patty, MD .  Documentation: I have reviewed the above documentation for accuracy and completeness, and I agree with the above.  Brendolyn Patty MD

## 2020-07-26 DIAGNOSIS — B079 Viral wart, unspecified: Secondary | ICD-10-CM | POA: Diagnosis not present

## 2020-08-09 DIAGNOSIS — Z8371 Family history of colonic polyps: Secondary | ICD-10-CM | POA: Diagnosis not present

## 2020-08-09 DIAGNOSIS — G4730 Sleep apnea, unspecified: Secondary | ICD-10-CM | POA: Diagnosis not present

## 2020-08-09 DIAGNOSIS — Z86010 Personal history of colonic polyps: Secondary | ICD-10-CM | POA: Diagnosis not present

## 2020-08-29 DIAGNOSIS — D485 Neoplasm of uncertain behavior of skin: Secondary | ICD-10-CM | POA: Diagnosis not present

## 2020-09-27 DIAGNOSIS — G4733 Obstructive sleep apnea (adult) (pediatric): Secondary | ICD-10-CM | POA: Diagnosis not present

## 2020-10-26 ENCOUNTER — Ambulatory Visit (INDEPENDENT_AMBULATORY_CARE_PROVIDER_SITE_OTHER): Payer: Medicare Other | Admitting: Family Medicine

## 2020-10-26 ENCOUNTER — Encounter: Payer: Self-pay | Admitting: Family Medicine

## 2020-10-26 ENCOUNTER — Other Ambulatory Visit: Payer: Self-pay

## 2020-10-26 VITALS — BP 105/50 | HR 67 | Temp 98.4°F | Resp 16 | Wt 214.3 lb

## 2020-10-26 DIAGNOSIS — J449 Chronic obstructive pulmonary disease, unspecified: Secondary | ICD-10-CM | POA: Diagnosis not present

## 2020-10-26 DIAGNOSIS — E1129 Type 2 diabetes mellitus with other diabetic kidney complication: Secondary | ICD-10-CM

## 2020-10-26 DIAGNOSIS — R809 Proteinuria, unspecified: Secondary | ICD-10-CM

## 2020-10-26 DIAGNOSIS — E785 Hyperlipidemia, unspecified: Secondary | ICD-10-CM

## 2020-10-26 DIAGNOSIS — Z6833 Body mass index (BMI) 33.0-33.9, adult: Secondary | ICD-10-CM

## 2020-10-26 DIAGNOSIS — E1169 Type 2 diabetes mellitus with other specified complication: Secondary | ICD-10-CM

## 2020-10-26 DIAGNOSIS — E669 Obesity, unspecified: Secondary | ICD-10-CM | POA: Diagnosis not present

## 2020-10-26 LAB — POCT GLYCOSYLATED HEMOGLOBIN (HGB A1C): Hemoglobin A1C: 6.7 % — AB (ref 4.0–5.6)

## 2020-10-26 NOTE — Progress Notes (Signed)
Established patient visit   Patient: David [name].   DOB: 28-Jun-1945   75 y.o. Male  MRN: [id number] Visit Date: 10/26/2020  Today's healthcare provider: Lavon Paganini, MD   Chief Complaint  Patient presents with   Diabetes   Hyperlipidemia   COPD   Subjective    Diabetes Pertinent negatives for hypoglycemia include no dizziness or headaches. Pertinent negatives for diabetes include no chest pain, no fatigue and no weakness.  Hyperlipidemia Pertinent negatives include no chest pain, myalgias or shortness of breath.  COPD There is no cough, shortness of breath or wheezing. Pertinent negatives include no chest pain, ear pain, fever, headaches, myalgias or sore throat. His past medical history is significant for COPD.    Diabetes Mellitus Type II, Follow-up  Lab Results  Component Value Date   HGBA1C 6.9 (H) 05/09/2020   HGBA1C 6.1 (A) 10/14/2019   HGBA1C 6.7 (A) 04/20/2019   Wt Readings from Last 3 Encounters:  10/26/20 214 lb 4.8 oz (97.2 kg)  05/09/20 221 lb (100.2 kg)  02/15/20 200 lb (90.7 kg)   Last seen for diabetes 5 months ago.  Management since then includes none. He reports excellent compliance with treatment. He is not having side effects.  Symptoms: No fatigue No foot ulcerations  No appetite changes No nausea  No paresthesia of the feet  No polydipsia  No polyuria No visual disturbances   No vomiting     Home blood sugar records:  not being checked  Episodes of hypoglycemia? No    Current insulin regiment: none Most Recent Eye Exam: 04/27/2020 Current exercise: not asked Current diet habits: well balanced  Pertinent Labs: Lab Results  Component Value Date   CHOL 157 05/09/2020   HDL 30 (L) 05/09/2020   LDLCALC 89 05/09/2020   TRIG 226 (H) 05/09/2020   CHOLHDL 5.2 (H) 05/09/2020   Lab Results  Component Value Date   NA 142 05/09/2020   K 5.2 05/09/2020   CREATININE 1.02 05/09/2020   GFRNONAA 72 05/09/2020   GFRAA 83  05/09/2020   GLUCOSE 152 (H) 05/09/2020     ---------------------------------------------------------------------------------------------------  Lipid/Cholesterol, Follow-up  Last lipid panel Other pertinent labs  Lab Results  Component Value Date   CHOL 157 05/09/2020   HDL 30 (L) 05/09/2020   LDLCALC 89 05/09/2020   TRIG 226 (H) 05/09/2020   CHOLHDL 5.2 (H) 05/09/2020   Lab Results  Component Value Date   ALT 28 05/09/2020   AST 18 05/09/2020   PLT 245 01/19/2015   TSH 3.940 01/19/2015     He was last seen for this 5 months ago.  Management since that visit includes none.  He reports excellent compliance with treatment. He is not having side effects.   Symptoms: No chest pain No chest pressure/discomfort  Yes dyspnea No lower extremity edema  No numbness or tingling of extremity No orthopnea  No palpitations No paroxysmal nocturnal dyspnea  No speech difficulty No syncope   Current diet: well balanced Current exercise: not asked  The 10-year ASCVD risk score Mikey Bussing DC Jr., et al., 2013) is: 37.2%  ---------------------------------------------------------------------------------------------------  COPD, Follow up  He was last seen for this 5 months ago. Changes made include none , chronic and stable Not on any meds Encouraged tobacco cessation .   He IS experiencing cough. He is NOT experiencing dyspnea, wheezing, fatigue, weight increase, weight loss, chills, fever, increased sputum, or colored sputum. he reports breathing is Unchanged.  Pulmonary Functions  Testing Results:  No results found for: FEV1, FVC, FEV1FVC, TLC  -----------------------------------------------------------------------------------------  Patient Active Problem List   Diagnosis Date Noted   Caregiver stress 10/20/2017   BPH (benign prostatic hyperplasia) 10/21/2014   COPD, mild ([place]) 10/21/2014   Type 2 diabetes mellitus with microalbuminuria, without long-term current use of  insulin ([place]) 10/21/2014   Failure of erection 10/21/2014   Hyperlipidemia associated with type 2 diabetes mellitus ([place]) 10/21/2014   Microalbuminuria due to type 2 diabetes mellitus ([place]) 10/21/2014   Obesity 10/21/2014   Allergic rhinitis 10/21/2014   Apnea, sleep 10/21/2014   Compulsive tobacco user syndrome 10/21/2014   Past Medical History:DiagnosisDate  Actinic keratosis 01/13/2017   R upper back post base of neck - biopsy proven    Allergy    COPD (chronic obstructive pulmonary disease) ([place])    misdiagnosed   Depression    Diabetes mellitus without complication ([place])    Dysplastic nevus 12/23/2007   R med mid post thigh - slight atypia   Dysplastic nevus 12/22/2008   L mid ant thigh - slight atypia   Dysplastic nevus 05/10/2020   RUQA lat, severia atypia, excised 07/11/20   Hyperlipidemia    Hypertension    Melanoma ([place]) 1988   MM - clavicle, [facility]   Microalbuminuria    Sleep apnea    Squamous cell carcinoma of skin 07/18/2020   nasal tip - Aypical Squamous Proliferation with Desmoplasia. Atypical squamous nests are concerning for invasive SCC. Refer for [facility].   AllergiesAllergenReactions  Alcohol Other (See Comments)   Addiction      Medications: Outpatient Medications Prior to Visit Medication Sig  atorvastatin (LIPITOR) 40 MG tablet TAKE 1 TABLET BY MOUTH EVERY DAY  fluticasone (FLONASE) 50 MCG/ACT nasal spray Place 2 sprays into both nostrils daily.  lisinopril (ZESTRIL) 5 MG tablet Take 1 tablet (5 mg total) by mouth daily.  loratadine (CLARITIN) 10 MG tablet Take 1 tablet (10 mg total) by mouth daily.  meloxicam (MOBIC) 15 MG tablet Take 1 tablet (15 mg total) by mouth as needed.  metFORMIN (GLUCOPHAGE) 500 MG tablet Take 1 tablet (500 mg total) by mouth at bedtime.  tamsulosin (FLOMAX) 0.4 MG CAPS capsule Take 1 capsule (0.4 mg total) by mouth daily.  No facility-administered medications prior to visit.   Review of Systems   Constitutional:  Negative for chills, fatigue and fever.  HENT:  Negative for ear pain, sinus pressure, sinus pain and sore throat.   Eyes:  Negative for pain.  Respiratory:  Negative for cough, chest tightness, shortness of breath and wheezing.   Cardiovascular:  Negative for chest pain, palpitations and leg swelling.  Gastrointestinal:  Negative for abdominal pain, blood in stool, diarrhea, nausea and vomiting.  Genitourinary:  Negative for flank pain, frequency and urgency.  Musculoskeletal:  Negative for back pain, myalgias and neck pain.  Neurological:  Negative for dizziness, weakness, light-headedness, numbness and headaches.      Objective    BP (!) 105/50   Pulse 67   Temp 98.4 F (36.9 C) (Oral)   Resp 16   Wt 214 lb 4.8 oz (97.2 kg)   SpO2 98%   BMI 33.56 kg/m     Physical Exam Vitals reviewed.  Constitutional:      General: He is not in acute distress.    Appearance: Normal appearance. He is not diaphoretic.  HENT:     Head: Normocephalic and atraumatic.  Eyes:     General: No scleral icterus.  Conjunctiva/sclera: Conjunctivae normal.  Cardiovascular:     Rate and Rhythm: Normal rate and regular rhythm.     Pulses: Normal pulses.     Heart sounds: Normal heart sounds. No murmur heard. Pulmonary:     Effort: Pulmonary effort is normal. No respiratory distress.     Breath sounds: Normal breath sounds. No wheezing or rhonchi.  Abdominal:     General: There is no distension.     Palpations: Abdomen is soft.     Tenderness: There is no abdominal tenderness.  Musculoskeletal:     Cervical back: Neck supple.     Right lower leg: No edema.     Left lower leg: No edema.  Lymphadenopathy:     Cervical: No cervical adenopathy.  Skin:    General: Skin is warm and dry.     Capillary Refill: Capillary refill takes less than 2 seconds.     Findings: No rash.  Neurological:     Mental Status: He is alert and oriented to person, place, and time.     Cranial  Nerves: No cranial nerve deficit.  Psychiatric:        Mood and Affect: Mood normal.        Behavior: Behavior normal.      No results found for any visits on 10/26/20.  Assessment & Plan     Problem List Items Addressed This Visit       Endocrine   Type 2 diabetes mellitus with microalbuminuria, without long-term current use of insulin (HCC) - Primary   Relevant Orders   POCT glycosylated hemoglobin (Hb A1C) (Completed)   Basic Metabolic Panel (BMET)   Hyperlipidemia associated with type 2 diabetes mellitus ([place])   Microalbuminuria due to type 2 diabetes mellitus ([place])     Other   Obesity     No follow-ups on file.      I,Essence Turner,acting as a Education administrator for Lavon Paganini, MD.,have documented all relevant documentation on the behalf of Lavon Paganini, MD,as directed by  Lavon Paganini, MD while in the presence of Lavon Paganini, MD.  I, Lavon Paganini, MD, have reviewed all documentation for this visit. The documentation on 10/26/20 for the exam, diagnosis, procedures, and orders are all accurate and complete.   Jamin Humphries, Dionne Bucy, MD, MPH [facility]

## 2020-10-26 NOTE — Assessment & Plan Note (Signed)
Well-controlled today Continue current medications Discussed diet and exercise Reviewed screenings and vaccinations Follow-up in 6 months and repeat A1c

## 2020-10-26 NOTE — Assessment & Plan Note (Signed)
Chronic and stable Continue lisinopril low-dose

## 2020-10-26 NOTE — Assessment & Plan Note (Signed)
Chronic and stable Encouraged tobacco cessation and

## 2020-10-26 NOTE — Assessment & Plan Note (Signed)
Reviewed last lipid panel Continue statin Recheck at next visit

## 2020-10-26 NOTE — Assessment & Plan Note (Signed)
Discussed importance of healthy weight management Discussed diet and exercise  

## 2020-10-27 ENCOUNTER — Encounter: Payer: Self-pay | Admitting: Internal Medicine

## 2020-10-30 ENCOUNTER — Encounter
Admission: RE | Disposition: A | Discharge status: Home / Self Care | Payer: Self-pay | Source: Home / Self Care | Attending: Internal Medicine

## 2020-10-30 ENCOUNTER — Ambulatory Visit: Payer: Medicare Other | Admitting: Certified Registered Nurse Anesthetist

## 2020-10-30 ENCOUNTER — Ambulatory Visit
Admission: RE | Admit: 2020-10-30 | Discharge: 2020-10-30 | Disposition: A | Discharge status: Home / Self Care | Payer: Medicare Other | Attending: Internal Medicine | Admitting: Internal Medicine

## 2020-10-30 ENCOUNTER — Ambulatory Visit: Payer: PRIVATE HEALTH INSURANCE | Admitting: Family Medicine

## 2020-10-30 ENCOUNTER — Encounter: Payer: Self-pay | Admitting: Internal Medicine

## 2020-10-30 DIAGNOSIS — K635 Polyp of colon: Secondary | ICD-10-CM | POA: Diagnosis not present

## 2020-10-30 DIAGNOSIS — Z79899 Other long term (current) drug therapy: Secondary | ICD-10-CM | POA: Insufficient documentation

## 2020-10-30 DIAGNOSIS — K640 First degree hemorrhoids: Secondary | ICD-10-CM | POA: Insufficient documentation

## 2020-10-30 DIAGNOSIS — E119 Type 2 diabetes mellitus without complications: Secondary | ICD-10-CM | POA: Insufficient documentation

## 2020-10-30 DIAGNOSIS — Z8371 Family history of colonic polyps: Secondary | ICD-10-CM | POA: Insufficient documentation

## 2020-10-30 DIAGNOSIS — Z1211 Encounter for screening for malignant neoplasm of colon: Secondary | ICD-10-CM | POA: Insufficient documentation

## 2020-10-30 DIAGNOSIS — F32A Depression, unspecified: Secondary | ICD-10-CM | POA: Diagnosis not present

## 2020-10-30 DIAGNOSIS — D123 Benign neoplasm of transverse colon: Secondary | ICD-10-CM | POA: Insufficient documentation

## 2020-10-30 DIAGNOSIS — K649 Unspecified hemorrhoids: Secondary | ICD-10-CM | POA: Diagnosis not present

## 2020-10-30 DIAGNOSIS — K5730 Diverticulosis of large intestine without perforation or abscess without bleeding: Secondary | ICD-10-CM | POA: Insufficient documentation

## 2020-10-30 DIAGNOSIS — Z86010 Personal history of colonic polyps: Secondary | ICD-10-CM | POA: Diagnosis not present

## 2020-10-30 DIAGNOSIS — Z791 Long term (current) use of non-steroidal anti-inflammatories (NSAID): Secondary | ICD-10-CM | POA: Diagnosis not present

## 2020-10-30 DIAGNOSIS — Z7984 Long term (current) use of oral hypoglycemic drugs: Secondary | ICD-10-CM | POA: Diagnosis not present

## 2020-10-30 HISTORY — PX: COLONOSCOPY WITH PROPOFOL: SHX5780

## 2020-10-30 LAB — GLUCOSE, CAPILLARY: Glucose-Capillary: 97 mg/dL (ref 70–99)

## 2020-10-30 SURGERY — COLONOSCOPY WITH PROPOFOL
Anesthesia: General

## 2020-10-30 MED ORDER — PROPOFOL 500 MG/50ML IV EMUL
INTRAVENOUS | Status: AC
  Filled 2020-10-30: qty 50

## 2020-10-30 MED ORDER — PROPOFOL 10 MG/ML IV BOLUS
INTRAVENOUS | Status: DC | PRN
  Administered 2020-10-30: 80 mg via INTRAVENOUS

## 2020-10-30 MED ORDER — LIDOCAINE HCL (CARDIAC) PF 100 MG/5ML IV SOSY
PREFILLED_SYRINGE | INTRAVENOUS | Status: DC | PRN
  Administered 2020-10-30: 60 mg via INTRAVENOUS

## 2020-10-30 MED ORDER — PROPOFOL 500 MG/50ML IV EMUL
INTRAVENOUS | Status: DC | PRN
  Administered 2020-10-30: 150 ug/kg/min via INTRAVENOUS

## 2020-10-30 MED ORDER — SODIUM CHLORIDE 0.9 % IV SOLN: INTRAVENOUS | Status: DC

## 2020-10-30 NOTE — H&P (Signed)
  Outpatient short stay form Pre-procedure 10/30/2020 1:10 PM David [name] K. Alice Reichert, M.D.  Primary Physician: Lavon Paganini, M.D.  Reason for visit:  Personal history of adenomatous colon polyps  History of present illness:                            Patient presents for colonoscopy for a personal hx of colon polyps. The patient denies abdominal pain, abnormal weight loss or rectal bleeding.     No current facility-administered medications for this encounter.  Medications Prior to Admission  Medication Sig Dispense Refill Last Dose   sildenafil (VIAGRA) 50 MG tablet Take 50 mg by mouth daily as needed for erectile dysfunction.      atorvastatin (LIPITOR) 40 MG tablet TAKE 1 TABLET BY MOUTH EVERY DAY 90 tablet 1    fluticasone (FLONASE) 50 MCG/ACT nasal spray Place 2 sprays into both nostrils daily. 48 g 3    lisinopril (ZESTRIL) 5 MG tablet Take 1 tablet (5 mg total) by mouth daily. 90 tablet 3    loratadine (CLARITIN) 10 MG tablet Take 1 tablet (10 mg total) by mouth daily. 90 tablet 3    meloxicam (MOBIC) 15 MG tablet Take 1 tablet (15 mg total) by mouth as needed. 90 tablet 3    metFORMIN (GLUCOPHAGE) 500 MG tablet Take 1 tablet (500 mg total) by mouth at bedtime. 90 tablet 3    tamsulosin (FLOMAX) 0.4 MG CAPS capsule Take 1 capsule (0.4 mg total) by mouth daily. 90 capsule 3      Allergies  Allergen Reactions   Alcohol Other (See Comments)    Addiction     Past Medical History:  Diagnosis Date   Actinic keratosis 01/13/2017   R upper back post base of neck - biopsy proven    Allergy    COPD (chronic obstructive pulmonary disease) ([place])    misdiagnosed   Depression    Diabetes mellitus without complication ([place])    Dysplastic nevus 12/23/2007   R med mid post thigh - slight atypia   Dysplastic nevus 12/22/2008   L mid ant thigh - slight atypia   Dysplastic nevus 05/10/2020   RUQA lat, severia atypia, excised 07/11/20   Hyperlipidemia    Hypertension    Melanoma  ([place]) 1988   MM - clavicle, txt[facility]   Microalbuminuria    Sleep apnea    Squamous cell carcinoma of skin 07/18/2020   nasal tip - Aypical Squamous Proliferation with Desmoplasia. Atypical squamous nests are concerning for invasive SCC. Refer for [facility].    Review of systems:  Otherwise negative.    Physical Exam  Gen: Alert, oriented. Appears stated age.  HEENT: [place]/AT. PERRLA. Lungs: CTA, no wheezes. CV: RR nl S1, S2. Abd: soft, benign, no masses. BS+ Ext: No edema. Pulses 2+    Planned procedures: Proceed with colonoscopy. The patient understands the nature of the planned procedure, indications, risks, alternatives and potential complications including but not limited to bleeding, infection, perforation, damage to internal organs and possible oversedation/side effects from anesthesia. The patient agrees and gives consent to proceed.  Please refer to procedure notes for findings, recommendations and patient disposition/instructions.     Joyell Emami K. Alice Reichert, M.D. Gastroenterology 10/30/2020  1:10 PM

## 2020-10-30 NOTE — Interval H&P Note (Signed)
History and Physical Interval Note:  10/30/2020 1:30 PM  David B [employer].  has presented today for surgery, with the diagnosis of Family history of colon polyp, personal history of colon polyps.  The various methods of treatment have been discussed with the patient and family. After consideration of risks, benefits and other options for treatment, the patient has consented to  Procedure(s): COLONOSCOPY WITH PROPOFOL (N/A) as a surgical intervention.  The patient's history has been reviewed, patient examined, no change in status, stable for surgery.  I have reviewed the patient's chart and labs.  Questions were answered to the patient's satisfaction.     [place], [place]

## 2020-10-30 NOTE — Interval H&P Note (Signed)
History and Physical Interval Note:  10/30/2020 1:12 PM  David [name] [employer].  has presented today for surgery, with the diagnosis of Family history of colon polyp, personal history of colon polyps.  The various methods of treatment have been discussed with the patient and family. After consideration of risks, benefits and other options for treatment, the patient has consented to  Procedure(s): COLONOSCOPY WITH PROPOFOL (N/A) as a surgical intervention.  The patient's history has been reviewed, patient examined, no change in status, stable for surgery.  I have reviewed the patient's chart and labs.  Questions were answered to the patient's satisfaction.     [place], [place]

## 2020-10-30 NOTE — Anesthesia Procedure Notes (Signed)
Procedure Name: MAC Date/Time: 10/30/2020 2:23 PM Performed by: Lowry Bowl, CRNA Pre-anesthesia Checklist: Patient identified, Emergency Drugs available, Suction available, Patient being monitored and Timeout performed Oxygen Delivery Method: Nasal cannula Induction Type: IV induction

## 2020-10-30 NOTE — Op Note (Signed)
[facility] Gastroenterology Patient Name: David [name] Procedure Date: 10/30/2020 2:12 PM MRN: [id number] Account #: [id number] Date of Birth: January 14, 1946 Admit Type: Ambulatory Age: 75 Room: [facility] ENDO ROOM 2 Gender: Male Note Status: Finalized Procedure:             Colonoscopy Indications:           High risk colon cancer surveillance: Personal history                         of multiple (3 or more) adenomas Providers:             Benay Pike. Dorethy Tomey MD, MD Medicines:             Propofol per Anesthesia Complications:         No immediate complications. Procedure:             Pre-Anesthesia Assessment:                        - The risks and benefits of the procedure and the                         sedation options and risks were discussed with the                         patient. All questions were answered and informed                         consent was obtained.                        - Patient identification and proposed procedure were                         verified prior to the procedure by the nurse. The                         procedure was verified in the procedure room.                        - ASA Grade Assessment: III - A patient with severe                         systemic disease.                        - After reviewing the risks and benefits, the patient                         was deemed in satisfactory condition to undergo the                         procedure.                        After obtaining informed consent, the colonoscope was                         passed under direct vision. Throughout the procedure,  the patient's blood pressure, pulse, and oxygen                         saturations were monitored continuously. The                         Colonoscope was introduced through the anus and                         advanced to the the cecum, identified by appendiceal                         orifice and ileocecal  valve. The colonoscopy was                         performed without difficulty. The patient tolerated                         the procedure well. The quality of the bowel                         preparation was good. The ileocecal valve, appendiceal                         orifice, and rectum were photographed. Findings:      The perianal and digital rectal examinations were normal. Pertinent       negatives include normal sphincter tone and no palpable rectal lesions.      Non-bleeding internal hemorrhoids were found during retroflexion. The       hemorrhoids were Grade I (internal hemorrhoids that do not prolapse).      Many small and large-mouthed diverticula were found in the entire colon.       There was no evidence of diverticular bleeding.      Two sessile polyps were found in the transverse colon and ascending       colon. The polyps were 4 to 5 mm in size. These polyps were removed with       a jumbo cold forceps. Resection and retrieval were complete.      The exam was otherwise without abnormality. Impression:            - Non-bleeding internal hemorrhoids.                        - Mild diverticulosis in the entire examined colon.                         There was no evidence of diverticular bleeding.                        - Two 4 to 5 mm polyps in the transverse colon and in                         the ascending colon, removed with a jumbo cold                         forceps. Resected and retrieved.                        -  The examination was otherwise normal. Recommendation:        - Patient has a contact number available for                         emergencies. The signs and symptoms of potential                         delayed complications were discussed with the patient.                         Return to normal activities tomorrow. Written                         discharge instructions were provided to the patient.                        - Resume previous diet.                         - Continue present medications.                        - If polyps are benign or adenomatous without                         dysplasia, I will advise NO further colonoscopy due to                         advanced age and/or severe comorbidity.                        - Return to GI office PRN.                        - The findings and recommendations were discussed with                         the patient. Procedure Code(s):     --- Professional ---                        [phone], Colonoscopy, flexible; with biopsy, single or                         multiple Diagnosis Code(s):     --- Professional ---                        K57.30, Diverticulosis of large intestine without                         perforation or abscess without bleeding                        K63.5, Polyp of colon                        K64.0, First degree hemorrhoids                        Z86.010, Personal history of colonic polyps CPT copyright 2019 American Medical Association. All rights  reserved. The codes documented in this report are preliminary and upon coder review may  be revised to meet current compliance requirements. Efrain Sella MD, MD 10/30/2020 2:35:32 PM This report has been signed electronically. Number of Addenda: 0 Note Initiated On: 10/30/2020 2:12 PM Scope Withdrawal Time: 0 hours 7 minutes 18 seconds  Total Procedure Duration: 0 hours 10 minutes 40 seconds  Estimated Blood Loss:  Estimated blood loss: none.      [facility]

## 2020-10-30 NOTE — Transfer of Care (Signed)
Immediate Anesthesia Transfer of Care Note  Patient: David [name].  Procedure(s) Performed: COLONOSCOPY WITH PROPOFOL  Patient Location: PACU and Endoscopy Unit  Anesthesia Type:General  Level of Consciousness: awake, drowsy and patient cooperative  Airway & Oxygen Therapy: Patient Spontanous Breathing  Post-op Assessment: Report given to RN and Post -op Vital signs reviewed and stable  Post vital signs: Reviewed and stable  Last Vitals:  Vitals Value Taken Time  BP 102/57 10/30/20 1436  Temp 36.8 C 10/30/20 1435  Pulse 67 10/30/20 1438  Resp 14 10/30/20 1438  SpO2 96 % 10/30/20 1438  Vitals shown include unvalidated device data.  Last Pain:  Vitals:   10/30/20 1435  TempSrc: Temporal  PainSc: 0-No pain         Complications: No notable events documented.

## 2020-10-30 NOTE — Anesthesia Preprocedure Evaluation (Signed)
Anesthesia Evaluation  Patient identified by MRN, date of birth, ID band Patient awake    Reviewed: Allergy & Precautions, NPO status , Patient's Chart, lab work & pertinent test results  History of Anesthesia Complications Negative for: history of anesthetic complications  Airway Mallampati: III  TM Distance: >3 FB Neck ROM: Full    Dental no notable dental hx.    Pulmonary sleep apnea and Continuous Positive Airway Pressure Ventilation , COPD, Current Smoker and Patient abstained from smoking.,    breath sounds clear to auscultation- rhonchi (-) wheezing      Cardiovascular hypertension, Pt. on medications (-) CAD, (-) Past MI, (-) Cardiac Stents and (-) CABG  Rhythm:Regular Rate:Normal - Systolic murmurs and - Diastolic murmurs    Neuro/Psych neg Seizures PSYCHIATRIC DISORDERS Depression negative neurological ROS     GI/Hepatic negative GI ROS, Neg liver ROS,   Endo/Other  diabetes, Oral Hypoglycemic Agents  Renal/GU negative Renal ROS     Musculoskeletal negative musculoskeletal ROS (+)   Abdominal (+) + obese,   Peds  Hematology negative hematology ROS (+)   Anesthesia Other Findings Past Medical History: 01/13/2017: Actinic keratosis     Comment:  R upper back post base of neck - biopsy proven  No date: Allergy No date: COPD (chronic obstructive pulmonary disease) (HCC)     Comment:  misdiagnosed No date: Depression No date: Diabetes mellitus without complication ([place]) 58/52/7782: Dysplastic nevus     Comment:  R med mid post thigh - slight atypia 12/22/2008: Dysplastic nevus     Comment:  L mid ant thigh - slight atypia 05/10/2020: Dysplastic nevus     Comment:  RUQA lat, severia atypia, excised 07/11/20 No date: Hyperlipidemia No date: Hypertension 1988: Melanoma ([place])     Comment:  MM - clavicle, txt[facility] No date: Microalbuminuria No date: Sleep apnea 07/18/2020: Squamous cell carcinoma of  skin     Comment:  nasal tip - Aypical Squamous Proliferation with               Desmoplasia. Atypical squamous nests are concerning for               invasive SCC. Refer for [facility].   Reproductive/Obstetrics                            Anesthesia Physical Anesthesia Plan  ASA: 3  Anesthesia Plan: General   Post-op Pain Management:    Induction: Intravenous  PONV Risk Score and Plan: 0 and Propofol infusion  Airway Management Planned: Natural Airway  Additional Equipment:   Intra-op Plan:   Post-operative Plan:   Informed Consent: I have reviewed the patients History and Physical, chart, labs and discussed the procedure including the risks, benefits and alternatives for the proposed anesthesia with the patient or authorized representative who has indicated his/her understanding and acceptance.     Dental advisory given  Plan Discussed with: CRNA and Anesthesiologist  Anesthesia Plan Comments:         Anesthesia Quick Evaluation

## 2020-10-31 ENCOUNTER — Encounter: Payer: Self-pay | Admitting: Internal Medicine

## 2020-10-31 DIAGNOSIS — R809 Proteinuria, unspecified: Secondary | ICD-10-CM | POA: Diagnosis not present

## 2020-10-31 DIAGNOSIS — E1129 Type 2 diabetes mellitus with other diabetic kidney complication: Secondary | ICD-10-CM | POA: Diagnosis not present

## 2020-10-31 NOTE — Anesthesia Postprocedure Evaluation (Signed)
Anesthesia Post Note  Patient: David [name].  Procedure(s) Performed: COLONOSCOPY WITH PROPOFOL  Patient location during evaluation: Endoscopy Anesthesia Type: General Level of consciousness: awake and alert and oriented Pain management: pain level controlled Vital Signs Assessment: post-procedure vital signs reviewed and stable Respiratory status: spontaneous breathing, nonlabored ventilation and respiratory function stable Cardiovascular status: blood pressure returned to baseline and stable Postop Assessment: no signs of nausea or vomiting Anesthetic complications: no   No notable events documented.   Last Vitals:  Vitals:   10/30/20 1435 10/30/20 1445  BP: (!) 102/57 111/66  Pulse: 68   Resp: 16   Temp: 36.8 C   SpO2: 97%     Last Pain:  Vitals:   10/31/20 0743  TempSrc:   PainSc: 0-No pain                 Nguyet Mercer

## 2020-11-01 LAB — BASIC METABOLIC PANEL
BUN/Creatinine Ratio: 11 (ref 10–24)
BUN: 11 mg/dL (ref 8–27)
CO2: 22 mmol/L (ref 20–29)
Calcium: 9.6 mg/dL (ref 8.6–10.2)
Chloride: 103 mmol/L (ref 96–106)
Creatinine, Ser: 1.01 mg/dL (ref 0.76–1.27)
Glucose: 195 mg/dL — ABNORMAL HIGH (ref 65–99)
Potassium: 5 mmol/L (ref 3.5–5.2)
Sodium: 141 mmol/L (ref 134–144)
eGFR: 78 mL/min/{1.73_m2} (ref >59)

## 2020-11-02 LAB — SURGICAL PATHOLOGY

## 2020-12-26 DIAGNOSIS — G4733 Obstructive sleep apnea (adult) (pediatric): Secondary | ICD-10-CM | POA: Diagnosis not present

## 2021-01-23 ENCOUNTER — Telehealth: Payer: Self-pay

## 2021-01-23 ENCOUNTER — Encounter: Payer: Self-pay | Admitting: Family Medicine

## 2021-01-23 NOTE — Telephone Encounter (Signed)
Copied from [place] [id number]. Topic: Appointment Scheduling - Scheduling Inquiry for Clinic >> Jan 23, 2021  8:37 AM Tessa Lerner A wrote: Reason for CRM: The patient would like to be seen for concerns related to left ear discomfort  The patient was previously seen by two providers for concerns related to an ear infection   The patient would like to be seen in office sooner than 02/06/21 if possible  Please contact further when possible

## 2021-01-25 ENCOUNTER — Encounter: Payer: Self-pay | Admitting: Family Medicine

## 2021-01-25 ENCOUNTER — Other Ambulatory Visit: Payer: Self-pay

## 2021-01-25 ENCOUNTER — Ambulatory Visit (INDEPENDENT_AMBULATORY_CARE_PROVIDER_SITE_OTHER): Payer: Medicare Other | Admitting: Family Medicine

## 2021-01-25 VITALS — BP 132/60 | HR 69 | Temp 98.3°F | Resp 16 | Wt 210.4 lb

## 2021-01-25 DIAGNOSIS — H6690 Otitis media, unspecified, unspecified ear: Secondary | ICD-10-CM | POA: Diagnosis not present

## 2021-01-25 DIAGNOSIS — H6692 Otitis media, unspecified, left ear: Secondary | ICD-10-CM

## 2021-01-25 MED ORDER — DOXYCYCLINE HYCLATE 100 MG PO TABS: 100.0000 mg | ORAL_TABLET | Freq: Two times a day (BID) | ORAL | 1 refills | Status: DC

## 2021-01-25 NOTE — Assessment & Plan Note (Signed)
Continued effusion and mild infection; repeat Abx dosing. Advised mild in nature, could do 5 day course, option to repeat if residual complaints.  No sinus involvement  Molar infection resolved  Recommend OTC debrox for regular ear cleaning assitance

## 2021-01-25 NOTE — Progress Notes (Signed)
Established patient visit   Patient: David [name].   DOB: May 09, 1945   75 y.o. Male  MRN: [id number] Visit Date: 01/25/2021  Today's healthcare provider: Gwyneth Sprout, FNP   Chief Complaint  Patient presents with   Recurrent Otitis   Subjective    Otalgia  There is pain in the left ear. This is a recurrent problem. The current episode started 1 to 4 weeks ago. The problem has been unchanged. There has been no fever. Pertinent negatives include no abdominal pain, coughing, diarrhea, ear discharge, headaches, hearing loss, neck pain, rash, rhinorrhea, sore throat or vomiting. Treatments tried: Amoxicillin by [facility], Cefdinir prescribed at [facility]. The treatment provided moderate relief.     Medications: Outpatient Medications Prior to Visit  Medication Sig   atorvastatin (LIPITOR) 40 MG tablet TAKE 1 TABLET BY MOUTH EVERY DAY   fluticasone (FLONASE) 50 MCG/ACT nasal spray Place 2 sprays into both nostrils daily.   lisinopril (ZESTRIL) 5 MG tablet Take 1 tablet (5 mg total) by mouth daily.   loratadine (CLARITIN) 10 MG tablet Take 1 tablet (10 mg total) by mouth daily.   meloxicam (MOBIC) 15 MG tablet Take 1 tablet (15 mg total) by mouth as needed.   metFORMIN (GLUCOPHAGE) 500 MG tablet Take 1 tablet (500 mg total) by mouth at bedtime.   tamsulosin (FLOMAX) 0.4 MG CAPS capsule Take 1 capsule (0.4 mg total) by mouth daily.   [DISCONTINUED] sildenafil (VIAGRA) 50 MG tablet Take 50 mg by mouth daily as needed for erectile dysfunction. (Patient not taking: Reported on 01/25/2021)   No facility-administered medications prior to visit.    Review of Systems  HENT:  Positive for ear pain. Negative for ear discharge, hearing loss, rhinorrhea and sore throat.   Respiratory:  Negative for cough.   Gastrointestinal:  Negative for abdominal pain, diarrhea and vomiting.  Musculoskeletal:  Negative for neck pain.  Skin:  Negative for rash.  Neurological:  Negative for  headaches.      Objective    BP 132/60   Pulse 69   Temp 98.3 F (36.8 C) (Oral)   Resp 16   Wt 210 lb 6.4 oz (95.4 kg)   SpO2 99%   BMI 32.95 kg/m  {Show previous vital signs (optional):23777}  Physical Exam Vitals and nursing note reviewed.  Constitutional:      Appearance: Normal appearance. He is obese.  HENT:     Head: Normocephalic and atraumatic.     Right Ear: Hearing, tympanic membrane, ear canal and external ear normal.     Left Ear: Hearing, ear canal and external ear normal. Tenderness present. A middle ear effusion is present. There is impacted cerumen. Tympanic membrane is erythematous.  Eyes:     Pupils: Pupils are equal, round, and reactive to light.  Cardiovascular:     Rate and Rhythm: Normal rate and regular rhythm.     Pulses: Normal pulses.     Heart sounds: Normal heart sounds.  Pulmonary:     Effort: Pulmonary effort is normal.     Breath sounds: Normal breath sounds.  Musculoskeletal:        General: Normal range of motion.     Cervical back: Normal range of motion.  Skin:    General: Skin is warm and dry.     Capillary Refill: Capillary refill takes less than 2 seconds.  Neurological:     General: No focal deficit present.     Mental  Status: He is alert and oriented to person, place, and time. Mental status is at baseline.     No results found for any visits on 01/25/21.  Assessment & Plan     Problem List Items Addressed This Visit       Nervous and Auditory   Recurrent AOM (acute otitis media) - Primary    Continued effusion and mild infection; repeat Abx dosing. Advised mild in nature, could do 5 day course, option to repeat if residual complaints.  No sinus involvement  Molar infection resolved  Recommend OTC debrox for regular ear cleaning assitance      Relevant Medications   doxycycline (VIBRA-TABS) 100 MG tablet      Return if symptoms worsen or fail to improve.      Vonna Kotyk, FNP, have reviewed all  documentation for this visit. The documentation on 01/25/21 for the exam, diagnosis, procedures, and orders are all accurate and complete.    Gwyneth Sprout, [place] [phone] (phone) [phone] (fax)  [place]

## 2021-01-26 ENCOUNTER — Telehealth: Payer: Medicare Other | Admitting: Family Medicine

## 2021-03-26 DIAGNOSIS — G4733 Obstructive sleep apnea (adult) (pediatric): Secondary | ICD-10-CM | POA: Diagnosis not present

## 2021-04-19 ENCOUNTER — Other Ambulatory Visit: Payer: Self-pay | Admitting: Family Medicine

## 2021-04-19 DIAGNOSIS — E1169 Type 2 diabetes mellitus with other specified complication: Secondary | ICD-10-CM

## 2021-04-20 ENCOUNTER — Other Ambulatory Visit: Payer: Self-pay | Admitting: Family Medicine

## 2021-04-20 DIAGNOSIS — E1169 Type 2 diabetes mellitus with other specified complication: Secondary | ICD-10-CM

## 2021-04-20 NOTE — Telephone Encounter (Signed)
Requested Prescriptions  Pending Prescriptions Disp Refills   lisinopril (ZESTRIL) 5 MG tablet [Pharmacy Med Name: LISINOPRIL 5MG TABLETS] 90 tablet 0    Sig: TAKE 1 TABLET(5 MG) BY MOUTH DAILY     Cardiovascular:  ACE Inhibitors Passed - 04/19/2021 10:48 AM      Passed - Cr in normal range and within 180 days    Creatinine  Date Value Ref Range Status  04/01/2012 0.81 0.60 - 1.30 mg/dL Final   Creatinine, Ser  Date Value Ref Range Status  10/31/2020 1.01 0.76 - 1.27 mg/dL Final         Passed - K in normal range and within 180 days    Potassium  Date Value Ref Range Status  10/31/2020 5.0 3.5 - 5.2 mmol/L Final  04/01/2012 4.1 3.5 - 5.1 mmol/L Final         Passed - Patient is not pregnant      Passed - Last BP in normal range    BP Readings from Last 1 Encounters:  01/25/21 132/60         Passed - Valid encounter within last 6 months    Recent Outpatient Visits          2 months ago Recurrent AOM (acute otitis media)   [facility] Tally Joe T, FNP   5 months ago Type 2 diabetes mellitus with microalbuminuria, without long-term current use of insulin ([place])   [facility] [employer], Dionne Bucy, MD   11 months ago Encounter for annual physical exam   [facility] [place], Dionne Bucy, MD   1 year ago Type 2 diabetes mellitus with microalbuminuria, without long-term current use of insulin [facility])   [facility], Dionne Bucy, MD   1 year ago Abscess and cellulitis of gluteal region   [facility], Dionne Bucy, MD      Future Appointments            In 3 weeks Bacigalupo, Dionne Bucy, MD [facility]            atorvastatin (LIPITOR) 40 MG tablet [Pharmacy Med Name: ATORVASTATIN 40MG TABLETS] 90 tablet 0    Sig: TAKE 1 TABLET(40 MG) BY MOUTH DAILY     Cardiovascular:  Antilipid - Statins Failed - 04/19/2021 10:48 AM      Failed - HDL in normal range and  within 360 days    HDL  Date Value Ref Range Status  05/09/2020 30 (L) >39 mg/dL Final         Failed - Triglycerides in normal range and within 360 days    Triglycerides  Date Value Ref Range Status  05/09/2020 226 (H) 0 - 149 mg/dL Final         Passed - Total Cholesterol in normal range and within 360 days    Cholesterol, Total  Date Value Ref Range Status  05/09/2020 157 100 - 199 mg/dL Final         Passed - LDL in normal range and within 360 days    LDL Chol Calc (NIH)  Date Value Ref Range Status  05/09/2020 89 0 - 99 mg/dL Final         Passed - Patient is not pregnant      Passed - Valid encounter within last 12 months    Recent Outpatient Visits          2 months ago Recurrent AOM (acute otitis media)   [facility] Tally Joe T, FNP   5 months ago Type 2 diabetes mellitus with microalbuminuria, without long-term current use of insulin [facility])   [facility] [place], Dionne Bucy, MD   11 months ago Encounter for annual physical exam   [facility], Dionne Bucy, MD   1 year ago Type 2 diabetes mellitus with microalbuminuria, without long-term current use of insulin [facility])   [facility], Dionne Bucy, MD   1 year ago Abscess and cellulitis of gluteal region   [facility], Dionne Bucy, MD      Future Appointments            In 3 weeks Bacigalupo, Dionne Bucy, MD [facility], [facility]            tamsulosin (FLOMAX) 0.4 MG CAPS capsule [Pharmacy Med Name: TAMSULOSIN 0.4MG CAPSULES] 90 capsule 0    Sig: TAKE 1 CAPSULE(0.4 MG) BY MOUTH DAILY     Urology: Alpha-Adrenergic Blocker Passed - 04/19/2021 10:48 AM      Passed - Last BP in normal range    BP Readings from Last 1 Encounters:  01/25/21 132/60         Passed - Valid encounter within last 12 months    Recent Outpatient Visits          2 months ago Recurrent AOM (acute otitis media)   [facility] Tally Joe T, FNP   5 months ago Type 2 diabetes mellitus with microalbuminuria, without long-term current use of insulin ([place])   [facility], Dionne Bucy, MD   11 months ago Encounter for annual physical exam   [facility], Dionne Bucy, MD   1 year ago Type 2 diabetes mellitus with microalbuminuria, without long-term current use of insulin [facility])   [facility], Dionne Bucy, MD   1 year ago Abscess and cellulitis of gluteal region   [facility], Dionne Bucy, MD      Future Appointments            In 3 weeks Bacigalupo, Dionne Bucy, MD [facility], [facility]            metFORMIN (GLUCOPHAGE) 500 MG tablet [Pharmacy Med Name: METFORMIN 500MG TABLETS] 90 tablet 0    Sig: TAKE 1 TABLET(500 MG) BY MOUTH AT BEDTIME     Endocrinology:  Diabetes - Biguanides Passed - 04/19/2021 10:48 AM      Passed - Cr in normal range and within 360 days    Creatinine  Date Value Ref Range Status  04/01/2012 0.81 0.60 - 1.30 mg/dL Final   Creatinine, Ser  Date Value Ref Range Status  10/31/2020 1.01 0.76 - 1.27 mg/dL Final         Passed - HBA1C is between 0 and 7.9 and within 180 days    Hemoglobin A1C  Date Value Ref Range Status  10/26/2020 6.7 (A) 4.0 - 5.6 % Final   Hgb A1c MFr Bld  Date Value Ref Range Status  05/09/2020 6.9 (H) 4.8 - 5.6 % Final    Comment:             Prediabetes: 5.7 - 6.4          Diabetes: >6.4          Glycemic control for adults with diabetes: <7.0          Passed - eGFR in normal range and within 360  days    EGFR (African American)  Date Value Ref Range Status  04/01/2012 >60  Final   GFR calc Af Amer  Date Value Ref Range Status  05/09/2020 83 >59 mL/min/1.73 Final    Comment:    **In accordance with recommendations from the NKF-ASN Task force,**   [facility] is in the process of updating its eGFR calculation to the   2021 CKD-EPI  creatinine equation that estimates kidney function   without a race variable.    EGFR (Non-African Amer.)  Date Value Ref Range Status  04/01/2012 >60  Final    Comment:    eGFR values <37m/min/1.73 m2 may be an indication of chronic kidney disease (CKD). Calculated eGFR is useful in patients with stable renal function. The eGFR calculation will not be reliable in acutely ill patients when serum creatinine is changing rapidly. It is not useful in  patients on dialysis. The eGFR calculation may not be applicable to patients at the low and high extremes of body sizes, pregnant women, and vegetarians.    GFR calc non Af Amer  Date Value Ref Range Status  05/09/2020 72 >59 mL/min/1.73 Final   eGFR  Date Value Ref Range Status  10/31/2020 78 >59 mL/min/1.73 Final         Passed - Valid encounter within last 6 months    Recent Outpatient Visits          2 months ago Recurrent AOM (acute otitis media)   [facility]   5 months ago Type 2 diabetes mellitus with microalbuminuria, without long-term current use of insulin ([facility]   [facility]   11 months ago Encounter for annual physical exam   [facility]   1 year ago Type 2 diabetes mellitus with microalbuminuria, without long-term current use of insulin ([facility]   [facility]   1 year ago Abscess and cellulitis of gluteal region   [facility]      Future Appointments            In 3 weeks Bacigalupo, [facility] [place]

## 2021-04-20 NOTE — Telephone Encounter (Signed)
[facility] pharmacy contacted and will resend requested medications.

## 2021-04-20 NOTE — Telephone Encounter (Signed)
Is this ok to refill for patient. He has his follow up scheduled 05/14/2021

## 2021-04-22 ENCOUNTER — Encounter: Payer: Self-pay | Admitting: Family Medicine

## 2021-04-22 DIAGNOSIS — E1169 Type 2 diabetes mellitus with other specified complication: Secondary | ICD-10-CM

## 2021-04-23 MED ORDER — TAMSULOSIN HCL 0.4 MG PO CAPS: ORAL_CAPSULE | ORAL | 1 refills | Status: DC

## 2021-04-23 MED ORDER — METFORMIN HCL 500 MG PO TABS: ORAL_TABLET | ORAL | 1 refills | Status: DC

## 2021-04-23 MED ORDER — LISINOPRIL 5 MG PO TABS: ORAL_TABLET | ORAL | 1 refills | Status: DC

## 2021-04-23 MED ORDER — ATORVASTATIN CALCIUM 40 MG PO TABS: ORAL_TABLET | ORAL | 1 refills | Status: DC

## 2021-04-28 IMAGING — CT CT CHEST LUNG CANCER SCREENING LOW DOSE W/O CM
2 of 5 series · 15 of 40 positions shown, 18 images · non-contrast
Comparison: 02/11/2019.

CLINICAL DATA: Lung cancer screening. Fifty-four pack-year history.
Current asymptomatic smoker.

EXAM:
CT CHEST WITHOUT CONTRAST LOW-DOSE FOR LUNG CANCER SCREENING
TECHNIQUE: Multidetector CT imaging of the chest was performed following the
standard protocol without IV contrast.

[Series 3: lung 1.00 · axial · 0.80mm/px · z∈[-1194,-902]mm · 12 of 324 slices shown, 15 images]
[im 16/324  mediastinal]
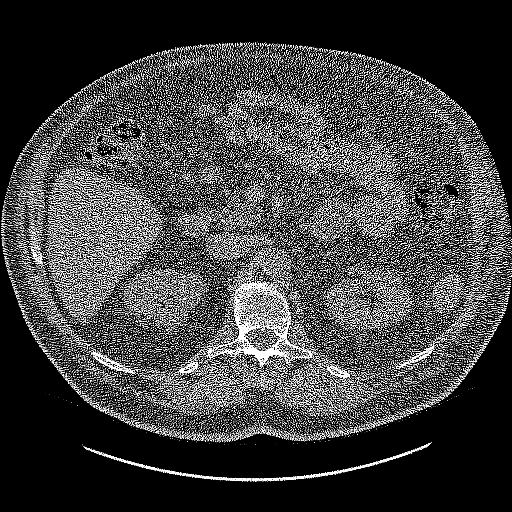
[im 16/324  lung]
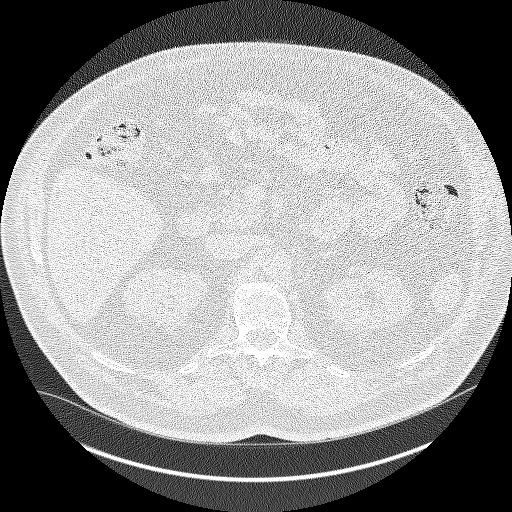
[im 47/324  lung]
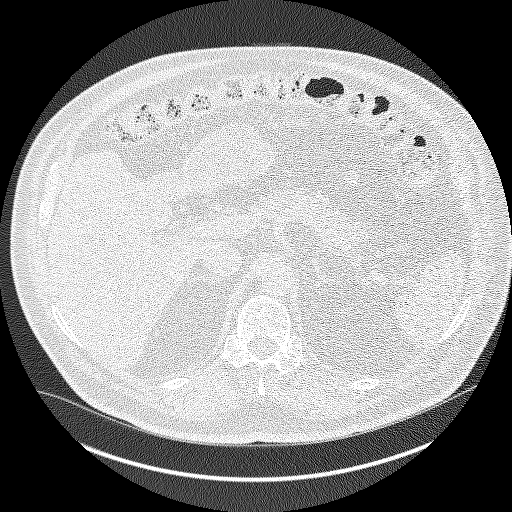
[im 77/324  lung]
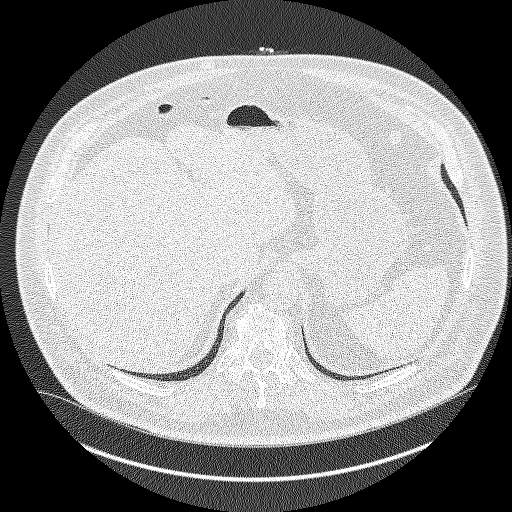
[im 93/324  lung]
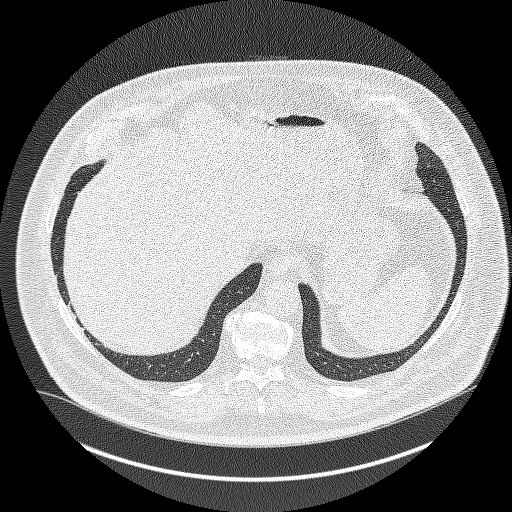
[im 124/324  mediastinal]
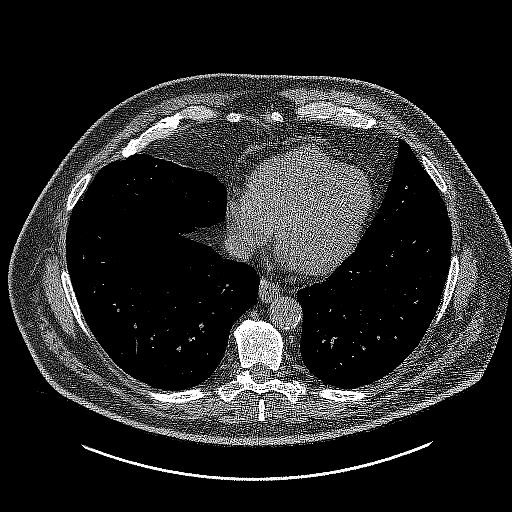
[im 124/324  lung]
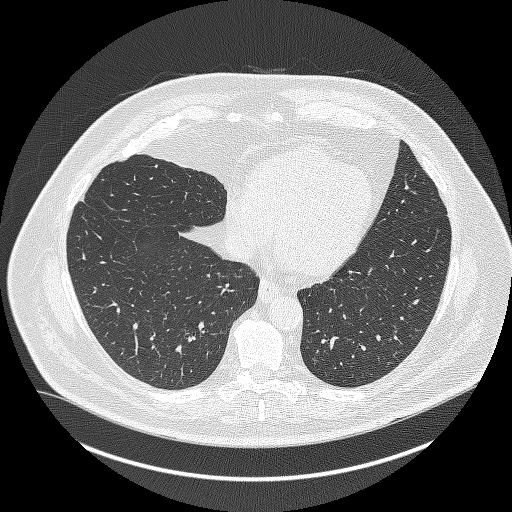
[im 154/324  lung]
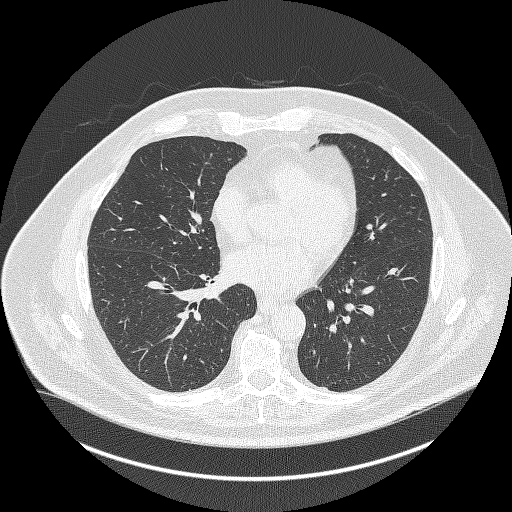
[im 170/324  lung]
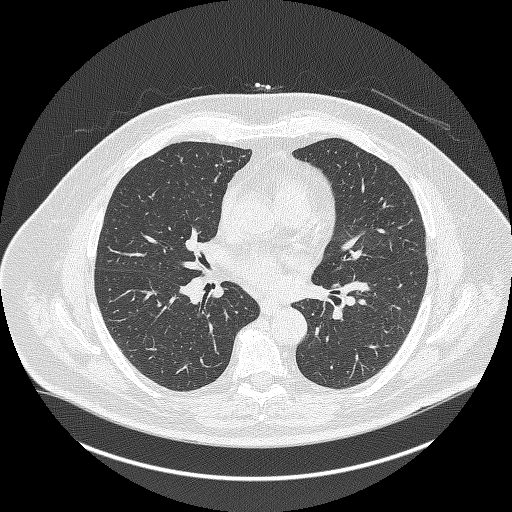
[im 200/324  lung]
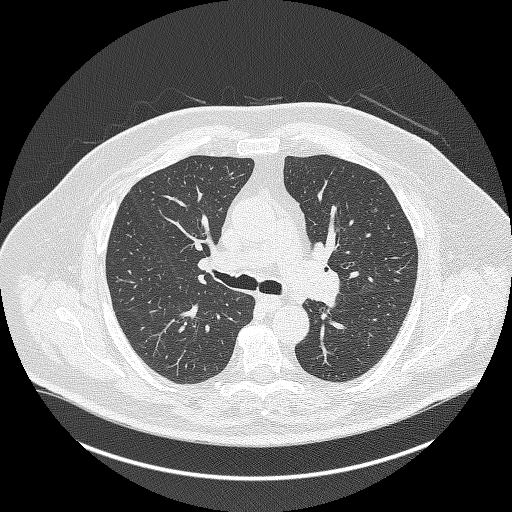
[im 231/324  mediastinal]
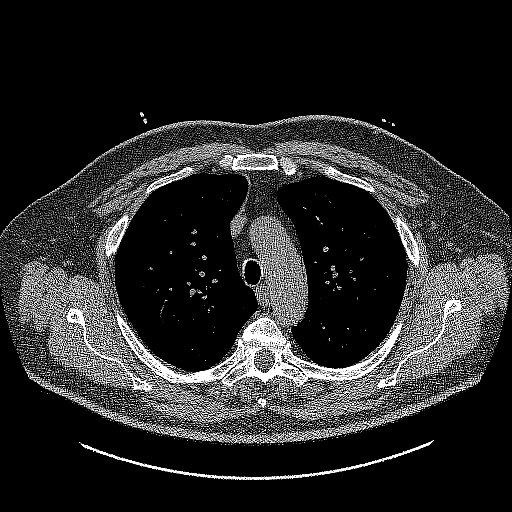
[im 231/324  lung]
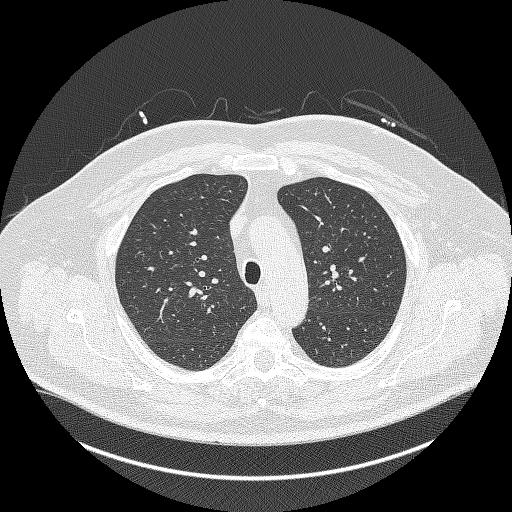
[im 247/324  lung]
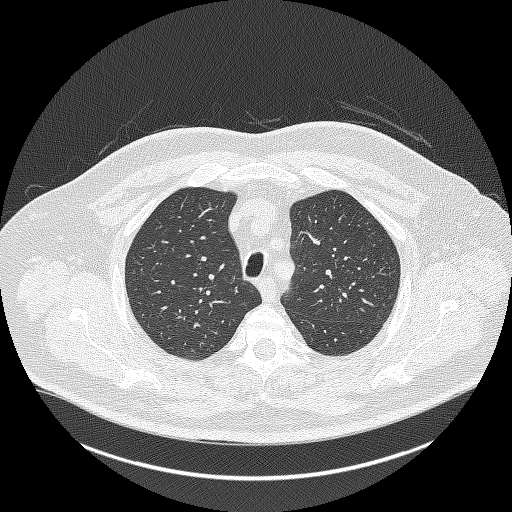
[im 277/324  lung]
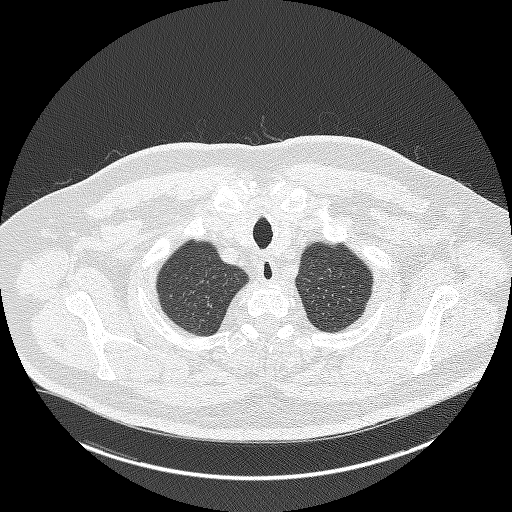
[im 308/324  lung]
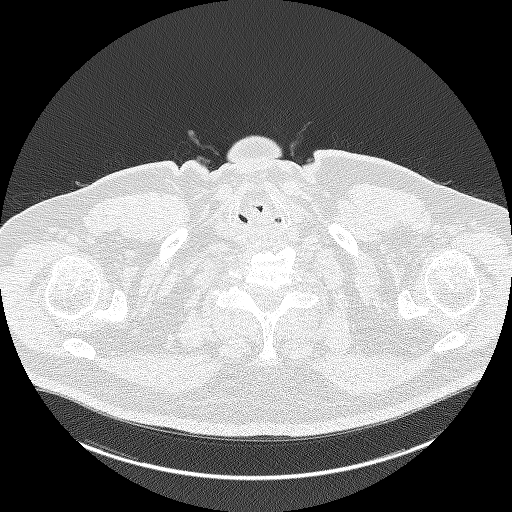

[Series 4: coronals lung 1.00 cor · coronal · 0.63mm/px · 3 of 393 slices shown]
[im 79/393  lung]
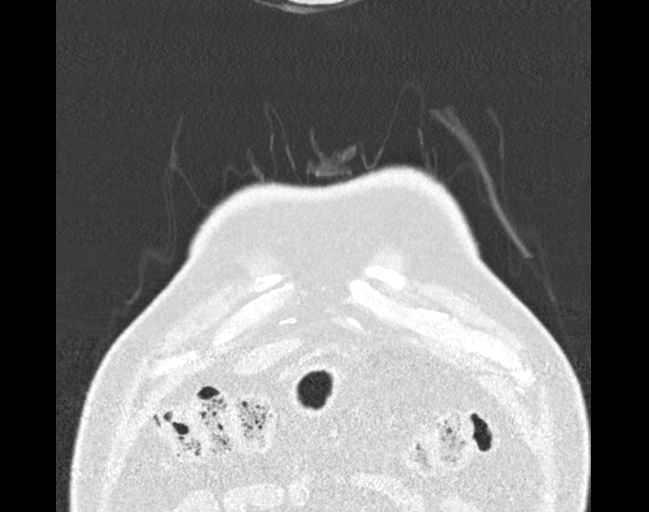
[im 157/393  lung]
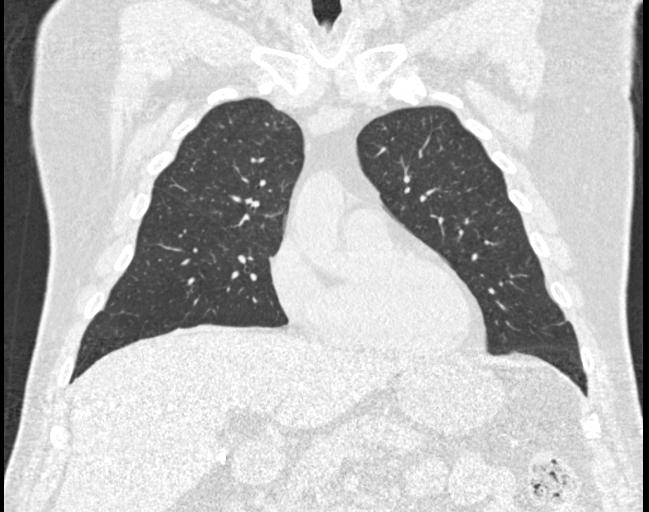
[im 236/393  lung]
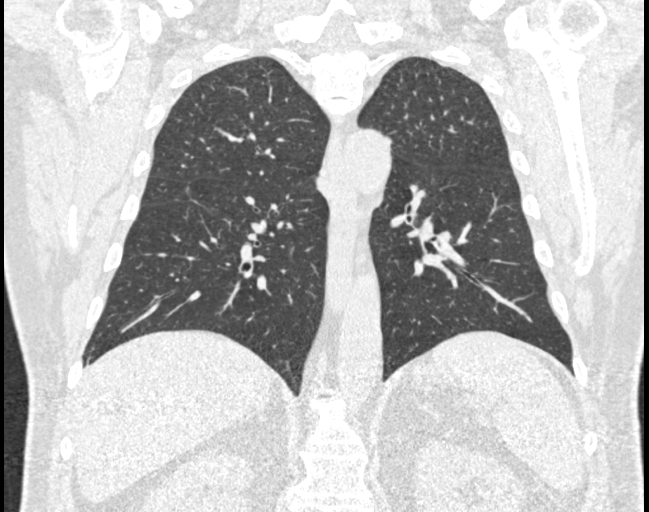

[15 of 40 positions shown; findings below may reference images not displayed]

FINDINGS: Cardiovascular: Aortic atherosclerosis and coronary artery
calcifications. The heart size appears normal. No pericardial
effusion.

Mediastinum/Nodes: No enlarged mediastinal, hilar, or axillary lymph
nodes. Thyroid gland, trachea, and esophagus demonstrate no
significant findings.

Lungs/Pleura: Mild centrilobular emphysema. No pleural effusion,
airspace consolidation, or atelectasis. There is a single tiny lung
nodule in the posteromedial aspect of the superior segment of left
lower lobe. This has an equivalent diameter of 3 mm. No additional
lung nodules identified.

Upper Abdomen: No acute abnormality.  Previous cholecystectomy.

Musculoskeletal: Degenerative disc disease identified within the
thoracic spine. No acute or suspicious osseous findings.
IMPRESSION: 1. Lung-RADS 2, benign appearance or behavior. Continue annual
screening with low-dose chest CT without contrast in 12 months.
2. Coronary artery calcifications.

Aortic Atherosclerosis (GIB84-3BZ.Z) and Emphysema ([id number].4).

## 2021-05-02 DIAGNOSIS — H2513 Age-related nuclear cataract, bilateral: Secondary | ICD-10-CM | POA: Diagnosis not present

## 2021-05-02 LAB — HM DIABETES EYE EXAM

## 2021-05-14 ENCOUNTER — Ambulatory Visit: Payer: Medicare Other | Admitting: Family Medicine

## 2021-05-15 ENCOUNTER — Other Ambulatory Visit: Payer: Self-pay

## 2021-05-15 ENCOUNTER — Ambulatory Visit (INDEPENDENT_AMBULATORY_CARE_PROVIDER_SITE_OTHER): Payer: Medicare Other

## 2021-05-15 VITALS — BP 126/58 | HR 87 | Temp 99.2°F | Ht 67.0 in | Wt 220.3 lb

## 2021-05-15 DIAGNOSIS — Z0000 Encounter for general adult medical examination without abnormal findings: Secondary | ICD-10-CM | POA: Diagnosis not present

## 2021-05-15 NOTE — Patient Instructions (Addendum)
[name] , Thank you for taking time to come for your Medicare Wellness Visit. I appreciate your ongoing commitment to your health goals. Please review the following plan we discussed and let me know if I can assist you in the future.   Screening recommendations/referrals: Colonoscopy: 10/30/20 Recommended yearly ophthalmology/optometry visit for glaucoma screening and checkup Recommended yearly dental visit for hygiene and checkup  Vaccinations: Influenza vaccine: 02/15/21 Pneumococcal vaccine: 06/08/14 Tdap vaccine: 10/04/17 Shingles vaccine: Zostavax 02/27/12    Shingrix 07/18/19   Covid-19: 05/28/19, 06/18/19, 02/11/20, 02/15/21  Advanced directives: no  Conditions/risks identified: none  Next appointment: Follow up in one year for your annual wellness visit. 05/16/22 @ 2:20pm  Preventive Care 77 Years and Older, Male Preventive care refers to lifestyle choices and visits with your health care provider that can promote health and wellness. What does preventive care include? A yearly physical exam. This is also called an annual well check. Dental exams once or twice a year. Routine eye exams. Ask your health care provider how often you should have your eyes checked. Personal lifestyle choices, including: Daily care of your teeth and gums. Regular physical activity. Eating a healthy diet. Avoiding tobacco and drug use. Limiting alcohol use. Practicing safe sex. Taking low doses of aspirin every day. Taking vitamin and mineral supplements as recommended by your health care provider. What happens during an annual well check? The services and screenings done by your health care provider during your annual well check will depend on your age, overall health, lifestyle risk factors, and family history of disease. Counseling  Your health care provider may ask you questions about your: Alcohol use. Tobacco use. Drug use. Emotional well-being. Home and relationship well-being. Sexual  activity. Eating habits. History of falls. Memory and ability to understand (cognition). Work and work Statistician. Screening  You may have the following tests or measurements: Height, weight, and BMI. Blood pressure. Lipid and cholesterol levels. These may be checked every 5 years, or more frequently if you are over 23 years old. Skin check. Lung cancer screening. You may have this screening every year starting at age 25 if you have a 30-pack-year history of smoking and currently smoke or have quit within the past 15 years. Fecal occult blood test (FOBT) of the stool. You may have this test every year starting at age 25. Flexible sigmoidoscopy or colonoscopy. You may have a sigmoidoscopy every 5 years or a colonoscopy every 10 years starting at age 14. Prostate cancer screening. Recommendations will vary depending on your family history and other risks. Hepatitis C blood test. Hepatitis B blood test. Sexually transmitted disease (STD) testing. Diabetes screening. This is done by checking your blood sugar (glucose) after you have not eaten for a while (fasting). You may have this done every 1-3 years. Abdominal aortic aneurysm (AAA) screening. You may need this if you are a current or former smoker. Osteoporosis. You may be screened starting at age 1 if you are at high risk. Talk with your health care provider about your test results, treatment options, and if necessary, the need for more tests. Vaccines  Your health care provider may recommend certain vaccines, such as: Influenza vaccine. This is recommended every year. Tetanus, diphtheria, and acellular pertussis (Tdap, Td) vaccine. You may need a Td booster every 10 years. Zoster vaccine. You may need this after age 56. Pneumococcal 13-valent conjugate (PCV13) vaccine. One dose is recommended after age 54. Pneumococcal polysaccharide (PPSV23) vaccine. One dose is recommended after age 62. Talk  to your health care provider about which  screenings and vaccines you need and how often you need them. This information is not intended to replace advice given to you by your health care provider. Make sure you discuss any questions you have with your health care provider. Document Released: 05/19/2015 Document Revised: 01/10/2016 Document Reviewed: 02/21/2015 Elsevier Interactive Patient Education  2017 [place] Prevention in the Home Falls can cause injuries. They can happen to people of all ages. There are many things you can do to make your home safe and to help prevent falls. What can I do on the outside of my home? Regularly fix the edges of walkways and driveways and fix any cracks. Remove anything that might make you trip as you walk through a door, such as a raised step or threshold. Trim any bushes or trees on the path to your home. Use bright outdoor lighting. Clear any walking paths of anything that might make someone trip, such as rocks or tools. Regularly check to see if handrails are loose or broken. Make sure that both sides of any steps have handrails. Any raised decks and porches should have guardrails on the edges. Have any leaves, snow, or ice cleared regularly. Use sand or salt on walking paths during winter. Clean up any spills in your garage right away. This includes oil or grease spills. What can I do in the bathroom? Use night lights. Install grab bars by the toilet and in the tub and shower. Do not use towel bars as grab bars. Use non-skid mats or decals in the tub or shower. If you need to sit down in the shower, use a plastic, non-slip stool. Keep the floor dry. Clean up any water that spills on the floor as soon as it happens. Remove soap buildup in the tub or shower regularly. Attach bath mats securely with double-sided non-slip rug tape. Do not have throw rugs and other things on the floor that can make you trip. What can I do in the bedroom? Use night lights. Make sure that you have a  light by your bed that is easy to reach. Do not use any sheets or blankets that are too big for your bed. They should not hang down onto the floor. Have a firm chair that has side arms. You can use this for support while you get dressed. Do not have throw rugs and other things on the floor that can make you trip. What can I do in the kitchen? Clean up any spills right away. Avoid walking on wet floors. Keep items that you use a lot in easy-to-reach places. If you need to reach something above you, use a strong step stool that has a grab bar. Keep electrical cords out of the way. Do not use floor polish or wax that makes floors slippery. If you must use wax, use non-skid floor wax. Do not have throw rugs and other things on the floor that can make you trip. What can I do with my stairs? Do not leave any items on the stairs. Make sure that there are handrails on both sides of the stairs and use them. Fix handrails that are broken or loose. Make sure that handrails are as long as the stairways. Check any carpeting to make sure that it is firmly attached to the stairs. Fix any carpet that is loose or worn. Avoid having throw rugs at the top or bottom of the stairs. If you do have throw rugs,  attach them to the floor with carpet tape. Make sure that you have a light switch at the top of the stairs and the bottom of the stairs. If you do not have them, ask someone to add them for you. What else can I do to help prevent falls? Wear shoes that: Do not have high heels. Have rubber bottoms. Are comfortable and fit you well. Are closed at the toe. Do not wear sandals. If you use a stepladder: Make sure that it is fully opened. Do not climb a closed stepladder. Make sure that both sides of the stepladder are locked into place. Ask someone to hold it for you, if possible. Clearly mark and make sure that you can see: Any grab bars or handrails. First and last steps. Where the edge of each step  is. Use tools that help you move around (mobility aids) if they are needed. These include: Canes. Walkers. Scooters. Crutches. Turn on the lights when you go into a dark area. Replace any light bulbs as soon as they burn out. Set up your furniture so you have a clear path. Avoid moving your furniture around. If any of your floors are uneven, fix them. If there are any pets around you, be aware of where they are. Review your medicines with your doctor. Some medicines can make you feel dizzy. This can increase your chance of falling. Ask your doctor what other things that you can do to help prevent falls. This information is not intended to replace advice given to you by your health care provider. Make sure you discuss any questions you have with your health care provider. Document Released: 02/16/2009 Document Revised: 09/28/2015 Document Reviewed: 05/27/2014 Elsevier Interactive Patient Education  2017 [employer].

## 2021-05-15 NOTE — Progress Notes (Signed)
Subjective:   David [name]. is a 76 y.o. male who presents for Medicare Annual/Subsequent preventive examination.  Review of Systems           Objective:    Today's Vitals   05/15/21 1541  BP: (!) 126/58  Pulse: 87  Temp: 99.2 F (37.3 C)  TempSrc: Oral  SpO2: 98%  Weight: 220 lb 4.8 oz (99.9 kg)  Height: 5\' 7"  (1.702 m)   Body mass index is 34.5 kg/m.  Advanced Directives 10/30/2020 05/09/2020 04/15/2019 05/08/2018 01/21/2018 03/23/2016 07/24/2015  Does Patient Have a Medical Advance Directive? Yes Yes Yes Yes Yes No Yes  Type of Advance Directive - [place];Living will [place];Living will - [place];Living will - [place];Living will  Copy of [place] in Chart? - No - copy requested No - copy requested - No - copy requested - -  Would patient like information on creating a medical advance directive? - - - - - No - patient declined information -    Current Medications (verified) Outpatient Encounter Medications as of 05/15/2021  Medication Sig   atorvastatin (LIPITOR) 40 MG tablet TAKE 1 TABLET(40 MG) BY MOUTH DAILY   doxycycline (VIBRA-TABS) 100 MG tablet Take 1 tablet (100 mg total) by mouth 2 (two) times daily.   fluticasone (FLONASE) 50 MCG/ACT nasal spray Place 2 sprays into both nostrils daily.   lisinopril (ZESTRIL) 5 MG tablet TAKE 1 TABLET(5 MG) BY MOUTH DAILY   loratadine (CLARITIN) 10 MG tablet Take 1 tablet (10 mg total) by mouth daily.   meloxicam (MOBIC) 15 MG tablet Take 1 tablet (15 mg total) by mouth as needed.   metFORMIN (GLUCOPHAGE) 500 MG tablet TAKE 1 TABLET(500 MG) BY MOUTH AT BEDTIME   tamsulosin (FLOMAX) 0.4 MG CAPS capsule TAKE 1 CAPSULE(0.4 MG) BY MOUTH DAILY   No facility-administered encounter medications on file as of 05/15/2021.    Allergies (verified) Alcohol   History: Past Medical History:  Diagnosis Date   Actinic keratosis  01/13/2017   R upper back post base of neck - biopsy proven    Allergy    COPD (chronic obstructive pulmonary disease) ([place])    misdiagnosed   Depression    Diabetes mellitus without complication ([place])    Dysplastic nevus 12/23/2007   R med mid post thigh - slight atypia   Dysplastic nevus 12/22/2008   L mid ant thigh - slight atypia   Dysplastic nevus 05/10/2020   RUQA lat, severia atypia, excised 07/11/20   Hyperlipidemia    Hypertension    Melanoma ([place]) 1988   MM - clavicle, txt[facility] at [facility]   Microalbuminuria    Sleep apnea    Squamous cell carcinoma of skin 07/18/2020   nasal tip - Aypical Squamous Proliferation with Desmoplasia. Atypical squamous nests are concerning for invasive SCC. MOHs 09/11/2020   Past Surgical History:  Procedure Laterality Date   broken right arm     CHOLECYSTECTOMY  2001   COLONOSCOPY WITH PROPOFOL N/A 05/08/2018   Procedure: COLONOSCOPY WITH PROPOFOL;  Surgeon: Manya Silvas, MD;  Location: [facility] ENDOSCOPY;  Service: Endoscopy;  Laterality: N/A;   COLONOSCOPY WITH PROPOFOL N/A 10/30/2020   Procedure: COLONOSCOPY WITH PROPOFOL;  Surgeon: Toledo, Benay Pike, MD;  Location: [facility] ENDOSCOPY;  Service: Gastroenterology;  Laterality: N/A;   MELANOMA EXCISION  1998   TONSILLECTOMY AND ADENOIDECTOMY  1953   Family History  Problem Relation Age of Onset  Cervical cancer Mother    Colon cancer Mother    Colon polyps Brother    Cervical cancer Maternal Grandmother    Diabetes Maternal Grandmother    Tuberculosis Maternal Grandmother    Lung cancer Maternal Grandfather    Cervical cancer Maternal Aunt    Social History   Socioeconomic History   Marital status: Married    Spouse name: Not on file   Number of children: 2   Years of education: College   Highest education level: Bachelor's degree (e.g., BA, AB, BS)  Occupational History    Employer: [employer]    Comment: retired  Tobacco Use   Smoking status: Every Day    Packs/day: 1.00    Years:  52.00    Pack years: 52.00    Types: Cigarettes   Smokeless tobacco: Never   Tobacco comments:    Pt started smoking again. Is smoking 1.25-1.5 PPD  Vaping Use   Vaping Use: Never used  Substance and Sexual Activity   Alcohol use: Not Currently    Comment: Previous Heavy Alcohol Abuse   Drug use: Not Currently   Sexual activity: Not on file  Other Topics Concern   Not on file  Social History Narrative   Not on file   Social Determinants of Health   Financial Resource Strain: Not on file  Food Insecurity: Not on file  Transportation Needs: Not on file  Physical Activity: Not on file  Stress: Not on file  Social Connections: Not on file    Tobacco Counseling Ready to quit: No Counseling given: Not Answered Tobacco comments: Pt started smoking again. Is smoking 1.25-1.5 PPD   Clinical Intake:  Pre-visit preparation completed: Yes  Pain : No/denies pain     Nutritional Risks: None Diabetes: Yes CBG done?: No Did pt. bring in CBG monitor from home?: No  How often do you need to have someone help you when you read instructions, pamphlets, or other written materials from your doctor or pharmacy?: 1 - Never  Diabetic?yes Nutrition Risk Assessment:  Has the patient had any N/V/D within the last 2 months?  Yes  Does the patient have any non-healing wounds?  Yes  Has the patient had any unintentional weight loss or weight gain?  No   Diabetes:  Is the patient diabetic?  Yes  If diabetic, was a CBG obtained today?  No  Did the patient bring in their glucometer from home?  No  How often do you monitor your CBG's? never.   Financial Strains and Diabetes Management:  Are you having any financial strains with the device, your supplies or your medication? No .  Does the patient want to be seen by Chronic Care Management for management of their diabetes?  No  Would the patient like to be referred to a Nutritionist or for Diabetic Management?  No   Diabetic  Exams:  Diabetic Eye Exam: Completed 04/27/20. Overdue for diabetic eye exam. Pt has been advised about the importance in completing this exam.   Diabetic Foot Exam: Completed 05/09/20. Pt has been advised about the importance in completing this exam.  Interpreter Needed?: No  Information entered by :: Kirke Shaggy, LPN   Activities of Daily Living In your present state of health, do you have any difficulty performing the following activities: 05/11/2021  Hearing? N  Vision? N  Difficulty concentrating or making decisions? N  Walking or climbing stairs? N  Dressing or bathing? N  Doing errands, shopping? N  Preparing Food  and eating ? N  Using the Toilet? N  In the past six months, have you accidently leaked urine? N  Do you have problems with loss of bowel control? N  Managing your Medications? N  Managing your Finances? N  Housekeeping or managing your Housekeeping? N  Some recent data might be hidden    Patient Care Team: Virginia Crews, MD as PCP - General (Family Medicine) Birder Robson, MD as Referring Physician (Ophthalmology) Ralene Bathe, MD (Dermatology)  Indicate any recent Medical Services you may have received from other than [facility] providers in the past year (date may be approximate).     Assessment:   This is a routine wellness examination for David [name].  Hearing/Vision screen No results found.  Dietary issues and exercise activities discussed:     Goals Addressed   None    Depression Screen PHQ 2/9 Scores 05/09/2020 04/15/2019 01/21/2018 05/24/2015 10/21/2014  PHQ - 2 Score 0 0 1 0 0  PHQ- 9 Score - - 1 - -    Fall Risk Fall Risk  05/11/2021 05/09/2020 04/15/2019 01/21/2018 10/28/2017  Falls in the past year? 0 0 0 No No  Number falls in past yr: - 0 0 - -  Injury with Fall? - 0 0 - -    FALL RISK PREVENTION PERTAINING TO THE HOME:  Any stairs in or around the home? Yes  If so, are there any without handrails? No  Home free of loose throw  rugs in walkways, pet beds, electrical cords, etc? Yes  Adequate lighting in your home to reduce risk of falls? Yes   ASSISTIVE DEVICES UTILIZED TO PREVENT FALLS:  Life alert? No  Use of a cane, walker or w/c? No  Grab bars in the bathroom? Yes  Shower chair or bench in shower? Yes  Elevated toilet seat or a handicapped toilet? No   TIMED UP AND GO:  Was the test performed? Yes .  Length of time to ambulate 10 feet: 4 sec.   Gait steady and fast without use of assistive device  Cognitive Function:     6CIT Screen 04/15/2019 01/21/2018  What Year? 0 points 0 points  What month? 0 points 0 points  What time? 0 points 0 points  Count back from 20 0 points 0 points  Months in reverse 0 points 0 points  Repeat phrase 0 points 0 points  Total Score 0 0    Immunizations Immunization History  Administered Date(s) Administered   Fluad Quad(high Dose 65+) 02/02/2019, 02/23/2020   Influenza Split 02/27/2012   Influenza, High Dose Seasonal PF 02/19/2016, 01/15/2017, 01/21/2018, 02/15/2021   Influenza,inj,Quad PF,6+ Mos 02/18/2014   Influenza-Unspecified 03/07/2015   PFIZER(Purple Top)SARS-COV-2 Vaccination 05/28/2019, 06/18/2019, 02/11/2020   Pfizer Covid-19 Vaccine Bivalent Booster 67yrs & up 02/15/2021   Pneumococcal Conjugate-13 06/08/2014   Pneumococcal Polysaccharide-23 12/16/2011   Td 09/11/2004   Tetanus 10/04/2017   Zoster Recombinat (Shingrix) 07/18/2019   Zoster, Live 02/27/2012    TDAP status: Up to date  Flu Vaccine status: Up to date  Pneumococcal vaccine status: Up to date  Covid-19 vaccine status: Completed vaccines  Qualifies for Shingles Vaccine? Yes   Zostavax completed Yes   Shingrix Completed?: No.    Education has been provided regarding the importance of this vaccine. Patient has been advised to call insurance company to determine out of pocket expense if they have not yet received this vaccine. Advised may also receive vaccine at local pharmacy or  Health Dept. Verbalized  acceptance and understanding.  Screening Tests Health Maintenance  Topic Date Due   Zoster Vaccines- Shingrix (2 of 2) 09/12/2019   HEMOGLOBIN A1C  04/27/2021   OPHTHALMOLOGY EXAM  04/27/2021   FOOT EXAM  05/09/2021   COLONOSCOPY (Pts 45-31yrs Insurance coverage will need to be confirmed)  10/31/2023   TETANUS/TDAP  10/05/2027   Pneumonia Vaccine 52+ Years old  Completed   INFLUENZA VACCINE  Completed   COVID-19 Vaccine  Completed   Hepatitis C Screening  Completed   HPV VACCINES  Aged Out    Health Maintenance  Health Maintenance Due  Topic Date Due   Zoster Vaccines- Shingrix (2 of 2) 09/12/2019   HEMOGLOBIN A1C  04/27/2021   OPHTHALMOLOGY EXAM  04/27/2021   FOOT EXAM  05/09/2021    Colorectal cancer screening: Type of screening: Colonoscopy. Completed 10/30/20. Repeat every aged out years  Lung Cancer Screening: (Low Dose CT Chest recommended if Age 43-80 years, 30 pack-year currently smoking OR have quit w/in 15years.) does not qualify.    Additional Screening:  Hepatitis C Screening: does qualify; Completed 12/20/11  Vision Screening: Recommended annual ophthalmology exams for early detection of glaucoma and other disorders of the eye. Is the patient up to date with their annual eye exam?  Yes  Who is the provider or what is the name of the office in which the patient attends annual eye exams? 04/27/20 [place] If pt is not established with a provider, would they like to be referred to a provider to establish care? No .   Dental Screening: Recommended annual dental exams for proper oral hygiene  Community Resource Referral / Chronic Care Management: CRR required this visit?  No   CCM required this visit?  No      Plan:     I have personally reviewed and noted the following in the patients chart:   Medical and social history Use of alcohol, tobacco or illicit drugs  Current medications and supplements including opioid  prescriptions. Patient is not currently taking opioid prescriptions. Functional ability and status Nutritional status Physical activity Advanced directives List of other physicians Hospitalizations, surgeries, and ER visits in previous 12 months Vitals Screenings to include cognitive, depression, and falls Referrals and appointments  In addition, I have reviewed and discussed with patient certain preventive protocols, quality metrics, and best practice recommendations. A written personalized care plan for preventive services as well as general preventive health recommendations were provided to patient.     Dionisio David, LPN   [id number]   Nurse Notes: none

## 2021-05-21 ENCOUNTER — Encounter: Payer: Self-pay | Admitting: Family Medicine

## 2021-05-21 ENCOUNTER — Other Ambulatory Visit: Payer: Self-pay

## 2021-05-21 ENCOUNTER — Ambulatory Visit (INDEPENDENT_AMBULATORY_CARE_PROVIDER_SITE_OTHER): Payer: Medicare Other | Admitting: Family Medicine

## 2021-05-21 VITALS — BP 136/80 | HR 70 | Ht 67.0 in | Wt 217.4 lb

## 2021-05-21 DIAGNOSIS — R809 Proteinuria, unspecified: Secondary | ICD-10-CM

## 2021-05-21 DIAGNOSIS — J449 Chronic obstructive pulmonary disease, unspecified: Secondary | ICD-10-CM | POA: Diagnosis not present

## 2021-05-21 DIAGNOSIS — E1169 Type 2 diabetes mellitus with other specified complication: Secondary | ICD-10-CM | POA: Diagnosis not present

## 2021-05-21 DIAGNOSIS — F17200 Nicotine dependence, unspecified, uncomplicated: Secondary | ICD-10-CM

## 2021-05-21 DIAGNOSIS — Z6834 Body mass index (BMI) 34.0-34.9, adult: Secondary | ICD-10-CM

## 2021-05-21 DIAGNOSIS — E669 Obesity, unspecified: Secondary | ICD-10-CM

## 2021-05-21 DIAGNOSIS — E785 Hyperlipidemia, unspecified: Secondary | ICD-10-CM | POA: Diagnosis not present

## 2021-05-21 DIAGNOSIS — E1129 Type 2 diabetes mellitus with other diabetic kidney complication: Secondary | ICD-10-CM

## 2021-05-21 LAB — POCT GLYCOSYLATED HEMOGLOBIN (HGB A1C)
Est. average glucose Bld gHb Est-mCnc: 143
Hemoglobin A1C: 6.6 % — AB (ref 4.0–5.6)

## 2021-05-21 MED ORDER — METFORMIN HCL 500 MG PO TABS: ORAL_TABLET | ORAL | 1 refills | Status: DC

## 2021-05-21 MED ORDER — ATORVASTATIN CALCIUM 40 MG PO TABS: ORAL_TABLET | ORAL | 1 refills | Status: DC

## 2021-05-21 MED ORDER — MELOXICAM 15 MG PO TABS: 15.0000 mg | ORAL_TABLET | ORAL | 3 refills | Status: DC | PRN

## 2021-05-21 MED ORDER — TAMSULOSIN HCL 0.4 MG PO CAPS: ORAL_CAPSULE | ORAL | 1 refills | Status: DC

## 2021-05-21 MED ORDER — LISINOPRIL 5 MG PO TABS: ORAL_TABLET | ORAL | 1 refills | Status: DC

## 2021-05-21 MED ORDER — LORATADINE 10 MG PO TABS: 10.0000 mg | ORAL_TABLET | Freq: Every day | ORAL | 3 refills | Status: AC

## 2021-05-21 MED ORDER — FLUTICASONE PROPIONATE 50 MCG/ACT NA SUSP: 2.0000 | Freq: Every day | NASAL | 3 refills | Status: DC

## 2021-05-21 NOTE — Assessment & Plan Note (Signed)
Previously well controlled Continue statin Repeat FLP and CMP Goal LDL < 70 

## 2021-05-21 NOTE — Assessment & Plan Note (Signed)
Chronic and stable On no meds Encourage tobacco cessation

## 2021-05-21 NOTE — Progress Notes (Signed)
Established patient visit   Patient: David [name].   DOB: 26-Apr-1946   76 y.o. Male  MRN: [id number] Visit Date: 05/21/2021  Today's healthcare provider: Lavon Paganini, MD   Chief Complaint  Patient presents with   Follow-up    DM   Subjective    HPI HPI     Follow-up    Additional comments: DM      Last edited by Doristine Devoid, CMA on 05/21/2021  9:39 AM.      Diabetes Mellitus Type II, Follow-up  Lab Results  Component Value Date   HGBA1C 6.6 (A) 05/21/2021   HGBA1C 6.7 (A) 10/26/2020   HGBA1C 6.9 (H) 05/09/2020   Wt Readings from Last 3 Encounters:  05/21/21 217 lb 6.4 oz (98.6 kg)  05/15/21 220 lb 4.8 oz (99.9 kg)  01/25/21 210 lb 6.4 oz (95.4 kg)   Last seen for diabetes 6 months ago.  Management since then includes continue current medications. He reports excellent compliance with treatment. He is not having side effects.  Symptoms: No fatigue No foot ulcerations  No appetite changes No nausea  No paresthesia of the feet  No polydipsia  No polyuria No visual disturbances   No vomiting     Home blood sugar records:  not being checked  Episodes of hypoglycemia? No  Most Recent Eye Exam: ROI sent Current exercise: walking Current diet habits: in general, a "healthy" diet    Pertinent Labs: Lab Results  Component Value Date   CHOL 157 05/09/2020   HDL 30 (L) 05/09/2020   LDLCALC 89 05/09/2020   TRIG 226 (H) 05/09/2020   CHOLHDL 5.2 (H) 05/09/2020   Lab Results  Component Value Date   NA 141 10/31/2020   K 5.0 10/31/2020   CREATININE 1.01 10/31/2020   EGFR 78 10/31/2020   MICROALBUR 20 07/24/2015     ---------------------------------------------------------------------------------------------------  Lipid/Cholesterol, Follow-up  Last lipid panel Other pertinent labs  Lab Results  Component Value Date   CHOL 157 05/09/2020   HDL 30 (L) 05/09/2020   LDLCALC 89 05/09/2020   TRIG 226 (H) 05/09/2020   CHOLHDL 5.2 (H)  05/09/2020   Lab Results  Component Value Date   ALT 28 05/09/2020   AST 18 05/09/2020   PLT 245 01/19/2015   TSH 3.940 01/19/2015     He was last seen for this 6 months ago.  Management since that visit includes continue Statin.  He reports excellent compliance with treatment. He is not having side effects.   Symptoms: No chest pain No chest pressure/discomfort  No dyspnea No lower extremity edema  No numbness or tingling of extremity No orthopnea  No palpitations No paroxysmal nocturnal dyspnea  No speech difficulty No syncope    The 10-year ASCVD risk score (Arnett DK, et al., 2019) is: 53.4%  ---------------------------------------------------------------------------------------------------  Medications: Outpatient Medications Prior to Visit  Medication Sig   [DISCONTINUED] atorvastatin (LIPITOR) 40 MG tablet TAKE 1 TABLET(40 MG) BY MOUTH DAILY   [DISCONTINUED] fluticasone (FLONASE) 50 MCG/ACT nasal spray Place 2 sprays into both nostrils daily.   [DISCONTINUED] lisinopril (ZESTRIL) 5 MG tablet TAKE 1 TABLET(5 MG) BY MOUTH DAILY   [DISCONTINUED] loratadine (CLARITIN) 10 MG tablet Take 1 tablet (10 mg total) by mouth daily.   [DISCONTINUED] meloxicam (MOBIC) 15 MG tablet Take 1 tablet (15 mg total) by mouth as needed.   [DISCONTINUED] metFORMIN (GLUCOPHAGE) 500 MG tablet TAKE 1 TABLET(500 MG) BY MOUTH AT BEDTIME   [DISCONTINUED]  tamsulosin (FLOMAX) 0.4 MG CAPS capsule TAKE 1 CAPSULE(0.4 MG) BY MOUTH DAILY   No facility-administered medications prior to visit.    Review of Systems per HPI     Objective    BP 136/80 (BP Location: Left Arm, Patient Position: Sitting, Cuff Size: Large)    Pulse 70    Ht _0  (1.702 m)    Wt 217 lb 6.4 oz (98.6 kg)    BMI 34.05 kg/m  {Show previous vital signs (optional):23777}  Physical Exam Vitals reviewed.  Constitutional:      General: He is not in acute distress.    Appearance: Normal appearance. He is not diaphoretic.  HENT:      Head: Normocephalic and atraumatic.  Eyes:     General: No scleral icterus.    Conjunctiva/sclera: Conjunctivae normal.  Cardiovascular:     Rate and Rhythm: Normal rate and regular rhythm.     Pulses: Normal pulses.     Heart sounds: Normal heart sounds. No murmur heard. Pulmonary:     Effort: Pulmonary effort is normal. No respiratory distress.     Breath sounds: Normal breath sounds. No wheezing or rhonchi.  Abdominal:     General: There is no distension.     Palpations: Abdomen is soft.     Tenderness: There is no abdominal tenderness.  Musculoskeletal:     Cervical back: Neck supple.     Right lower leg: No edema.     Left lower leg: No edema.  Lymphadenopathy:     Cervical: No cervical adenopathy.  Skin:    General: Skin is warm and dry.     Capillary Refill: Capillary refill takes less than 2 seconds.     Findings: No rash.  Neurological:     Mental Status: He is alert and oriented to person, place, and time.     Cranial Nerves: No cranial nerve deficit.  Psychiatric:        Mood and Affect: Mood normal.        Behavior: Behavior normal.    Diabetic Foot Exam - Simple   Simple Foot Form Diabetic Foot exam was performed with the following findings: Yes 05/21/2021  9:55 AM  Visual Inspection No deformities, no ulcerations, no other skin breakdown bilaterally: Yes Sensation Testing Intact to touch and monofilament testing bilaterally: Yes Pulse Check Posterior Tibialis and Dorsalis pulse intact bilaterally: Yes Comments      Results for orders placed or performed in visit on 05/21/21  POCT glycosylated hemoglobin (Hb A1C)  Result Value Ref Range   Hemoglobin A1C 6.6 (A) 4.0 - 5.6 %   Est. average glucose Bld gHb Est-mCnc 143     Assessment & Plan     Problem List Items Addressed This Visit       Respiratory   COPD, mild (HCC)    Chronic and stable On no meds Encourage tobacco cessation      Relevant Medications   loratadine (CLARITIN) 10 MG  tablet   fluticasone (FLONASE) 50 MCG/ACT nasal spray     Endocrine   Type 2 diabetes mellitus with microalbuminuria, without long-term current use of insulin ([place]) - Primary    Well controlled Continue current medications UTD on vaccines, eye exam (ROI sent), foot exam (done today) On ACEi On Statin Discussed diet and exercise F/u in 6 months       Relevant Medications   metFORMIN (GLUCOPHAGE) 500 MG tablet   lisinopril (ZESTRIL) 5 MG tablet   atorvastatin (LIPITOR) 40 MG  tablet   Other Relevant Orders   POCT glycosylated hemoglobin (Hb A1C) (Completed)   Urine Microalbumin w/creat. ratio   Hyperlipidemia associated with type 2 diabetes mellitus (HCC)    Previously well controlled Continue statin Repeat FLP and CMP Goal LDL < 70      Relevant Medications   metFORMIN (GLUCOPHAGE) 500 MG tablet   lisinopril (ZESTRIL) 5 MG tablet   atorvastatin (LIPITOR) 40 MG tablet   Other Relevant Orders   Lipid panel   Comprehensive metabolic panel   Microalbuminuria due to type 2 diabetes mellitus (HCC)    Chronic and stable Continue low dose lisinopril Recheck uACR      Relevant Medications   metFORMIN (GLUCOPHAGE) 500 MG tablet   lisinopril (ZESTRIL) 5 MG tablet   atorvastatin (LIPITOR) 40 MG tablet   Other Relevant Orders   Urine Microalbumin w/creat. ratio     Other   Obesity    Discussed importance of healthy weight management Discussed diet and exercise       Relevant Medications   metFORMIN (GLUCOPHAGE) 500 MG tablet   Compulsive tobacco user syndrome    Encourage tobacco cessation Remains precontemplative      Other Visit Diagnoses     Diabetes mellitus type 2 in obese (HCC)       Relevant Medications   metFORMIN (GLUCOPHAGE) 500 MG tablet   lisinopril (ZESTRIL) 5 MG tablet   atorvastatin (LIPITOR) 40 MG tablet        Return in about 6 months (around 11/18/2021) for CPE.      I, Lavon Paganini, MD, have reviewed all documentation for this visit.  The documentation on 05/21/21 for the exam, diagnosis, procedures, and orders are all accurate and complete.   Montae Stager, Dionne Bucy, MD, MPH [facility]

## 2021-05-21 NOTE — Assessment & Plan Note (Signed)
Encourage tobacco cessation Remains precontemplative

## 2021-05-21 NOTE — Assessment & Plan Note (Signed)
Chronic and stable Continue low dose lisinopril Recheck uACR

## 2021-05-21 NOTE — Assessment & Plan Note (Signed)
Discussed importance of healthy weight management Discussed diet and exercise  

## 2021-05-21 NOTE — Assessment & Plan Note (Signed)
Well controlled Continue current medications UTD on vaccines, eye exam (ROI sent), foot exam (done today) On ACEi On Statin Discussed diet and exercise F/u in 6 months

## 2021-05-22 ENCOUNTER — Encounter: Payer: Self-pay | Admitting: Family Medicine

## 2021-05-22 LAB — LIPID PANEL
Chol/HDL Ratio: 5.8 ratio — ABNORMAL HIGH (ref 0.0–5.0)
Cholesterol, Total: 179 mg/dL (ref 100–199)
HDL: 31 mg/dL — ABNORMAL LOW (ref >39)
LDL Chol Calc (NIH): 98 mg/dL (ref 0–99)
Triglycerides: 296 mg/dL — ABNORMAL HIGH (ref 0–149)
VLDL Cholesterol Cal: 50 mg/dL — ABNORMAL HIGH (ref 5–40)

## 2021-05-22 LAB — COMPREHENSIVE METABOLIC PANEL
ALT: 38 IU/L (ref 0–44)
AST: 24 IU/L (ref 0–40)
Albumin/Globulin Ratio: 1.9 (ref 1.2–2.2)
Albumin: 4.2 g/dL (ref 3.7–4.7)
Alkaline Phosphatase: 95 IU/L (ref 44–121)
BUN/Creatinine Ratio: 13 (ref 10–24)
BUN: 13 mg/dL (ref 8–27)
Bilirubin Total: 0.6 mg/dL (ref 0.0–1.2)
CO2: 24 mmol/L (ref 20–29)
Calcium: 9.9 mg/dL (ref 8.6–10.2)
Chloride: 103 mmol/L (ref 96–106)
Creatinine, Ser: 0.98 mg/dL (ref 0.76–1.27)
Globulin, Total: 2.2 g/dL (ref 1.5–4.5)
Glucose: 171 mg/dL — ABNORMAL HIGH (ref 70–99)
Potassium: 5.6 mmol/L — ABNORMAL HIGH (ref 3.5–5.2)
Sodium: 140 mmol/L (ref 134–144)
Total Protein: 6.4 g/dL (ref 6.0–8.5)
eGFR: 80 mL/min/{1.73_m2} (ref >59)

## 2021-05-22 LAB — MICROALBUMIN / CREATININE URINE RATIO
Creatinine, Urine: 128.7 mg/dL
Microalb/Creat Ratio: 7 mg/g creat (ref 0–29)
Microalbumin, Urine: 9.3 ug/mL

## 2021-06-28 DIAGNOSIS — G4733 Obstructive sleep apnea (adult) (pediatric): Secondary | ICD-10-CM | POA: Diagnosis not present

## 2021-07-24 ENCOUNTER — Other Ambulatory Visit: Payer: Self-pay

## 2021-07-24 DIAGNOSIS — F17210 Nicotine dependence, cigarettes, uncomplicated: Secondary | ICD-10-CM

## 2021-07-24 DIAGNOSIS — Z87891 Personal history of nicotine dependence: Secondary | ICD-10-CM

## 2021-07-26 ENCOUNTER — Ambulatory Visit
Admission: RE | Admit: 2021-07-26 | Discharge: 2021-07-26 | Disposition: A | Discharge status: Home / Self Care | Payer: Medicare Other | Source: Ambulatory Visit | Attending: Acute Care | Admitting: Acute Care

## 2021-07-26 ENCOUNTER — Other Ambulatory Visit: Payer: Self-pay

## 2021-07-26 DIAGNOSIS — Z87891 Personal history of nicotine dependence: Secondary | ICD-10-CM | POA: Diagnosis not present

## 2021-07-26 DIAGNOSIS — G4733 Obstructive sleep apnea (adult) (pediatric): Secondary | ICD-10-CM | POA: Diagnosis not present

## 2021-07-26 DIAGNOSIS — F17210 Nicotine dependence, cigarettes, uncomplicated: Secondary | ICD-10-CM | POA: Diagnosis not present

## 2021-07-30 ENCOUNTER — Other Ambulatory Visit: Payer: Self-pay | Admitting: Acute Care

## 2021-07-30 DIAGNOSIS — F17210 Nicotine dependence, cigarettes, uncomplicated: Secondary | ICD-10-CM

## 2021-07-30 DIAGNOSIS — Z87891 Personal history of nicotine dependence: Secondary | ICD-10-CM

## 2021-08-01 ENCOUNTER — Encounter: Payer: Self-pay | Admitting: Family Medicine

## 2021-08-26 DIAGNOSIS — G4733 Obstructive sleep apnea (adult) (pediatric): Secondary | ICD-10-CM | POA: Diagnosis not present

## 2021-08-28 ENCOUNTER — Ambulatory Visit (INDEPENDENT_AMBULATORY_CARE_PROVIDER_SITE_OTHER): Payer: Medicare Other | Admitting: Family Medicine

## 2021-08-28 ENCOUNTER — Encounter: Payer: Self-pay | Admitting: Family Medicine

## 2021-08-28 VITALS — BP 104/68 | HR 82 | Temp 99.0°F | Resp 16 | Wt 216.8 lb

## 2021-08-28 DIAGNOSIS — L255 Unspecified contact dermatitis due to plants, except food: Secondary | ICD-10-CM | POA: Diagnosis not present

## 2021-08-28 DIAGNOSIS — L0390 Cellulitis, unspecified: Secondary | ICD-10-CM | POA: Diagnosis not present

## 2021-08-28 MED ORDER — CEPHALEXIN 500 MG PO CAPS: 500.0000 mg | ORAL_CAPSULE | Freq: Four times a day (QID) | ORAL | 0 refills | Status: DC

## 2021-08-28 NOTE — Progress Notes (Signed)
?  ?[employer] as a Education administrator for Gwyneth Sprout, FNP.,have documented all relevant documentation on the behalf of Gwyneth Sprout, FNP,as directed by  Gwyneth Sprout, FNP while in the presence of Gwyneth Sprout, FNP.  ? ?Established patient visit ? ? ?Patient: David [name].   DOB: 08-12-1945   76 y.o. Male  MRN: [id number] ?Visit Date: 08/28/2021 ? ?Today's healthcare provider: Gwyneth Sprout, FNP  ?Re Introduced to nurse practitioner role and practice setting.  All questions answered.  Discussed provider/patient relationship and expectations. ? ? ?Chief Complaint  ?Patient presents with  ? Insect Bite  ?  Patient reports that a week ago he was cutting vines and leaves in his garden and states that within 24hrs, he noticed redness on his right forearm and what appears to be a white head under his skin. Patient denies pain or itching of the skin, patient states that he has tried otc Neosporin.  ? ?Subjective  ?  ?HPI ?HPI   ? ? Insect Bite   ? Additional comments: Patient reports that a week ago he was cutting vines and leaves in his garden and states that within 24hrs, he noticed redness on his right forearm and what appears to be a white head under his skin. Patient denies pain or itching of the skin, patient states that he has tried otc Neosporin. ? ?  ?  ?Last edited by Minette Headland, CMA on 08/28/2021  2:19 PM.  ?  ?  ? ?Medications: ?Outpatient Medications Prior to Visit  ?Medication Sig  ? atorvastatin (LIPITOR) 40 MG tablet TAKE 1 TABLET(40 MG) BY MOUTH DAILY  ? fluticasone (FLONASE) 50 MCG/ACT nasal spray Place 2 sprays into both nostrils daily.  ? lisinopril (ZESTRIL) 5 MG tablet TAKE 1 TABLET(5 MG) BY MOUTH DAILY  ? loratadine (CLARITIN) 10 MG tablet Take 1 tablet (10 mg total) by mouth daily.  ? meloxicam (MOBIC) 15 MG tablet Take 1 tablet (15 mg total) by mouth as needed.  ? metFORMIN (GLUCOPHAGE) 500 MG tablet TAKE 1 TABLET(500 MG) BY MOUTH AT BEDTIME  ? tamsulosin (FLOMAX) 0.4 MG CAPS  capsule TAKE 1 CAPSULE(0.4 MG) BY MOUTH DAILY  ? ?No facility-administered medications prior to visit.  ? ? ?Review of Systems ? ? ?  Objective  ?  ?BP 104/68   Pulse 82   Temp 99 ?F (37.2 ?C) (Oral)   Resp 16   Wt 216 lb 12.8 oz (98.3 kg)   BMI 33.96 kg/m?  ? ? ?Physical Exam ?Vitals and nursing note reviewed.  ?Constitutional:   ?   General: He is not in acute distress. ?   Appearance: Normal appearance. He is obese. He is not ill-appearing, toxic-appearing or diaphoretic.  ?HENT:  ?   Head: Normocephalic and atraumatic.  ?Eyes:  ?   Pupils: Pupils are equal, round, and reactive to light.  ?Cardiovascular:  ?   Rate and Rhythm: Normal rate and regular rhythm.  ?   Pulses: Normal pulses.  ?   Heart sounds: Normal heart sounds.  ?Pulmonary:  ?   Effort: Pulmonary effort is normal.  ?   Breath sounds: Normal breath sounds.  ?Musculoskeletal:     ?   General: Normal range of motion.  ?   Cervical back: Normal range of motion.  ?Skin: ?   General: Skin is warm and dry.  ?   Capillary Refill: Capillary refill takes less than 2 seconds.  ?   Findings: Erythema,  lesion and rash present.  ? ?    ?   Comments: Allergic poison ivy dermatitis to dorsal of R hand; white/crusty lesion cellulitis to R forearm, ventral side in addition to poison ivy dermatitis   ?Neurological:  ?   General: No focal deficit present.  ?   Mental Status: He is alert and oriented to person, place, and time. Mental status is at baseline.  ?  ? ?No results found for any visits on 08/28/21. ? Assessment & Plan  ?  ? ?Problem List Items Addressed This Visit   ? ?  ? Musculoskeletal and Integument  ? Dermatitis due to plants, including poison ivy, sumac, and oak  ?  Acute, uncomplicated ?Dermatitis rash limited to R arm ?Denies systemic symptoms ? ?  ?  ?  ? Other  ? Cellulitis - Primary  ?  Acute, undiagnosed ?Irritation of skin in the middle of poison ivy dermatitis ?Had a "white head" that popped; now appears crusty ?Does not itch compared to  other areas ?Surrounding redness, has expanded since "white head popped" ?Will treat for cellulitis; does not follow typical pattern of brown recluse, RMSF or Lyme ?RTC 10 days if not improved; has derm- Dr Raliegh Ip at [facility] ? ?  ?  ? Relevant Medications  ? cephALEXin (KEFLEX) 500 MG capsule  ? ? ? ? ?Return in about 10 days (around 09/07/2021).  ?   ? ?I, Gwyneth Sprout, FNP, have reviewed all documentation for this visit. The documentation on 08/28/21 for the exam, diagnosis, procedures, and orders are all accurate and complete. ? ? ? ?Gwyneth Sprout, FNP  ?[facility] ?[phone] (phone) ?[phone] (fax) ? ?[facility] ?

## 2021-08-28 NOTE — Assessment & Plan Note (Signed)
Acute, uncomplicated ?Dermatitis rash limited to R arm ?Denies systemic symptoms ?

## 2021-08-28 NOTE — Assessment & Plan Note (Signed)
Acute, undiagnosed ?Irritation of skin in the middle of poison ivy dermatitis ?Had a "white head" that popped; now appears crusty ?Does not itch compared to other areas ?Surrounding redness, has expanded since "white head popped" ?Will treat for cellulitis; does not follow typical pattern of brown recluse, RMSF or Lyme ?RTC 10 days if not improved; has derm- Dr Raliegh Ip at [facility] ?

## 2021-09-06 DIAGNOSIS — L03031 Cellulitis of right toe: Secondary | ICD-10-CM | POA: Diagnosis not present

## 2021-09-06 DIAGNOSIS — L03032 Cellulitis of left toe: Secondary | ICD-10-CM | POA: Diagnosis not present

## 2021-09-18 DIAGNOSIS — L03031 Cellulitis of right toe: Secondary | ICD-10-CM | POA: Diagnosis not present

## 2021-09-18 DIAGNOSIS — L03032 Cellulitis of left toe: Secondary | ICD-10-CM | POA: Diagnosis not present

## 2021-09-26 DIAGNOSIS — G4733 Obstructive sleep apnea (adult) (pediatric): Secondary | ICD-10-CM | POA: Diagnosis not present

## 2021-11-19 ENCOUNTER — Encounter: Payer: Self-pay | Admitting: Family Medicine

## 2021-12-03 ENCOUNTER — Encounter: Payer: Medicare Other | Admitting: Family Medicine

## 2021-12-12 NOTE — Progress Notes (Unsigned)
 Complete physical exam   Patient: David B Lycan Jr.   DOB: 11/28/1945   75 y.o. Male  MRN: [id number] Visit Date: 12/13/2021  Today's healthcare provider: Elise T Payne, FNP  Introduced to nurse practitioner role and practice setting.  All questions answered.  Discussed provider/patient relationship and expectations.   I,Tiffany J Bragg,acting as a scribe for Elise T Payne, FNP.,have documented all relevant documentation on the behalf of Elise T Payne, FNP,as directed by  Elise T Payne, FNP while in the presence of Elise T Payne, FNP.   Chief Complaint  Patient presents with   Annual Exam   Subjective    David B Reisz Jr. is a 75 y.o. male who presents today for a complete physical exam.  He reports consuming a general diet.  Exercise is walking at work and yardwork.   He generally feels well. He reports sleeping well. He does have additional problems to discuss today. Complains of chest discomfort in the afternoon or evening sometimes and lasts an hour or 2.   Past Medical History:  Diagnosis Date   Actinic keratosis 01/13/2017   R upper back post base of neck - biopsy proven    Allergy    COPD (chronic obstructive pulmonary disease) (HCC)    misdiagnosed   Depression    Diabetes mellitus without complication (HCC)    Dysplastic nevus 12/23/2007   R med mid post thigh - slight atypia   Dysplastic nevus 12/22/2008   L mid ant thigh - slight atypia   Dysplastic nevus 05/10/2020   RUQA lat, severia atypia, excised 07/11/20   Hyperlipidemia    Hypertension    Melanoma (HCC) 1988   MM - clavicle, txted at [facility]   Microalbuminuria    Sleep apnea    Squamous cell carcinoma of skin 07/18/2020   nasal tip - Aypical Squamous Proliferation with Desmoplasia. Atypical squamous nests are concerning for invasive SCC. MOHs 09/11/2020   Past Surgical History:  Procedure Laterality Date   broken right arm     CHOLECYSTECTOMY  2001   COLONOSCOPY WITH PROPOFOL N/A 05/08/2018    Procedure: COLONOSCOPY WITH PROPOFOL;  Surgeon: Elliott, Robert T, MD;  Location: [facility]COPY;  Service: Endoscopy;  Laterality: N/A;   COLONOSCOPY WITH PROPOFOL N/A 10/30/2020   Procedure: COLONOSCOPY WITH PROPOFOL;  Surgeon: Toledo, Teodoro K, MD;  Location: [facility] ENDOSCOPY;  Service: Gastroenterology;  Laterality: N/A;   MELANOMA EXCISION  1998   TONSILLECTOMY AND ADENOIDECTOMY  1953   Social History   Socioeconomic History   Marital status: Married    Spouse name: Not on file   Number of children: 2   Years of education: College   Highest education level: Bachelor's degree (e.g., BA, AB, BS)  Occupational History    Employer: [employer]    Comment: retired  Tobacco Use   Smoking status: Every Day    Packs/day: 1.00    Years: 52.00    Total pack years: 52.00    Types: Cigarettes   Smokeless tobacco: Never   Tobacco comments:    Pt started smoking again. Is smoking 1.25-1.5 PPD  Vaping Use   Vaping Use: Never used  Substance and Sexual Activity   Alcohol use: Not Currently    Comment: Previous Heavy Alcohol Abuse   Drug use: Not Currently   Sexual activity: Not on file  Other Topics Concern   Not on file  Social History Narrative   Not on file   Social Determinants of Health     Financial Resource Strain: Low Risk  (05/15/2021)   Overall Financial Resource Strain (CARDIA)    Difficulty of Paying Living Expenses: Not hard at all  Food Insecurity: No Food Insecurity (05/15/2021)   Hunger Vital Sign    Worried About Running Out of Food in the Last Year: Never true    Ran Out of Food in the Last Year: Never true  Transportation Needs: No Transportation Needs (05/15/2021)   PRAPARE - Transportation    Lack of Transportation (Medical): No    Lack of Transportation (Non-Medical): No  Physical Activity: Inactive (05/15/2021)   Exercise Vital Sign    Days of Exercise per Week: 0 days    Minutes of Exercise per Session: 0 min  Stress: No Stress Concern Present (05/15/2021)    [employer] - Occupational Stress Questionnaire    Feeling of Stress : Not at all  Social Connections: Socially Integrated (05/15/2021)   Social Connection and Isolation Panel [NHANES]    Frequency of Communication with Friends and Family: More than three times a week    Frequency of Social Gatherings with Friends and Family: More than three times a week    Attends Religious Services: More than 4 times per year    Active Member of Clubs or Organizations: Yes    Attends Club or Organization Meetings: More than 4 times per year    Marital Status: Married  Intimate Partner Violence: Not At Risk (05/15/2021)   Humiliation, Afraid, Rape, and Kick questionnaire    Fear of Current or Ex-Partner: No    Emotionally Abused: No    Physically Abused: No    Sexually Abused: No   Family Status  Relation Name Status   Mother  Alive   Brother  Alive   MGM  Deceased   MGF  Deceased   Father  Deceased at age 60       Unknown causes   Mat Aunt  (Not Specified)   Family History  Problem Relation Age of Onset   Cervical cancer Mother    Colon cancer Mother    Colon polyps Brother    Cervical cancer Maternal Grandmother    Diabetes Maternal Grandmother    Tuberculosis Maternal Grandmother    Lung cancer Maternal Grandfather    Cervical cancer Maternal Aunt    Allergies  Allergen Reactions   Alcohol Other (See Comments)    Addiction    Patient Care Team: Bacigalupo, Angela M, MD as PCP - General (Family Medicine) Porfilio, William, MD as Referring Physician (Ophthalmology) Kowalski, David C, MD (Dermatology)   Medications: Outpatient Medications Prior to Visit  Medication Sig   atorvastatin (LIPITOR) 40 MG tablet Take 1 tablet by mouth daily.   fluticasone (FLONASE) 50 MCG/ACT nasal spray Place 2 sprays into both nostrils daily.   lisinopril (ZESTRIL) 5 MG tablet Take 1 tablet by mouth daily.   loratadine (CLARITIN) 10 MG tablet Take 1 tablet (10 mg total) by  mouth daily.   meloxicam (MOBIC) 15 MG tablet Take 1 tablet (15 mg total) by mouth as needed.   metFORMIN (GLUCOPHAGE) 500 MG tablet TAKE 1 TABLET(500 MG) BY MOUTH AT BEDTIME   tamsulosin (FLOMAX) 0.4 MG CAPS capsule TAKE 1 CAPSULE(0.4 MG) BY MOUTH DAILY   [DISCONTINUED] meloxicam (MOBIC) 7.5 MG tablet Take 1 tablet by mouth 2 (two) times daily.   [DISCONTINUED] atorvastatin (LIPITOR) 40 MG tablet TAKE 1 TABLET(40 MG) BY MOUTH DAILY   [DISCONTINUED] cephALEXin (KEFLEX) 500 MG capsule Take 1 capsule (  500 mg total) by mouth 4 (four) times daily.   [DISCONTINUED] lisinopril (ZESTRIL) 5 MG tablet TAKE 1 TABLET(5 MG) BY MOUTH DAILY   No facility-administered medications prior to visit.    Review of Systems  Last CBC Lab Results  Component Value Date   WBC 8.3 01/19/2015   HGB 14.1 01/19/2015   HCT 41.7 01/19/2015   MCV 90 01/19/2015   MCH 30.3 01/19/2015   RDW 13.9 01/19/2015   PLT 245 01/19/2015   Last metabolic panel Lab Results  Component Value Date   GLUCOSE 171 (H) 05/21/2021   NA 140 05/21/2021   K 5.6 (H) 05/21/2021   CL 103 05/21/2021   CO2 24 05/21/2021   BUN 13 05/21/2021   CREATININE 0.98 05/21/2021   EGFR 80 05/21/2021   CALCIUM 9.9 05/21/2021   PROT 6.4 05/21/2021   ALBUMIN 4.2 05/21/2021   LABGLOB 2.2 05/21/2021   AGRATIO 1.9 05/21/2021   BILITOT 0.6 05/21/2021   ALKPHOS 95 05/21/2021   AST 24 05/21/2021   ALT 38 05/21/2021   ANIONGAP 5 (L) 04/01/2012   Last lipids Lab Results  Component Value Date   CHOL 179 05/21/2021   HDL 31 (L) 05/21/2021   LDLCALC 98 05/21/2021   TRIG 296 (H) 05/21/2021   CHOLHDL 5.8 (H) 05/21/2021   Last hemoglobin A1c Lab Results  Component Value Date   HGBA1C 6.6 (A) 05/21/2021   Last thyroid functions Lab Results  Component Value Date   TSH 3.940 01/19/2015    Objective     BP (!) 113/57 (BP Location: Right Arm, Patient Position: Sitting, Cuff Size: Normal)   Pulse 70   Temp 98 F (36.7 C) (Oral)   Resp 16    Ht 5' 7" (1.702 m)   Wt 204 lb (92.5 kg)   SpO2 99%   BMI 31.95 kg/m   BP Readings from Last 3 Encounters:  12/13/21 (!) 113/57  08/28/21 104/68  05/21/21 136/80   Wt Readings from Last 3 Encounters:  12/13/21 204 lb (92.5 kg)  08/28/21 216 lb 12.8 oz (98.3 kg)  07/26/21 212 lb (96.2 kg)   SpO2 Readings from Last 3 Encounters:  12/13/21 99%  05/15/21 98%  01/25/21 99%   Physical Exam Vitals and nursing note reviewed.  Constitutional:      General: He is awake. He is not in acute distress.    Appearance: Normal appearance. He is well-developed and well-groomed. He is obese. He is not ill-appearing, toxic-appearing or diaphoretic.  HENT:     Head: Normocephalic and atraumatic.     Jaw: There is normal jaw occlusion. No trismus, tenderness, swelling or pain on movement.     Salivary Glands: Right salivary gland is not diffusely enlarged or tender. Left salivary gland is not diffusely enlarged or tender.     Right Ear: Hearing, tympanic membrane, ear canal and external ear normal. There is no impacted cerumen.     Left Ear: Hearing, tympanic membrane, ear canal and external ear normal. There is no impacted cerumen.     Nose: Nose normal. No congestion or rhinorrhea.     Right Turbinates: Not enlarged, swollen or pale.     Left Turbinates: Not enlarged, swollen or pale.     Right Sinus: No maxillary sinus tenderness or frontal sinus tenderness.     Left Sinus: No maxillary sinus tenderness or frontal sinus tenderness.     Mouth/Throat:     Lips: Pink.     Mouth: Mucous membranes are   moist. No injury, lacerations, oral lesions or angioedema.     Pharynx: Oropharynx is clear. Uvula midline. No pharyngeal swelling, oropharyngeal exudate or posterior oropharyngeal erythema.     Tonsils: No tonsillar exudate or tonsillar abscesses.  Eyes:     General: Lids are normal. Vision grossly intact. Gaze aligned appropriately.        Right eye: No discharge.        Left eye: No discharge.      Extraocular Movements: Extraocular movements intact.     Conjunctiva/sclera: Conjunctivae normal.     Pupils: Pupils are equal, round, and reactive to light.  Neck:     Thyroid: No thyroid mass, thyromegaly or thyroid tenderness.     Vascular: No carotid bruit.     Trachea: Trachea normal. No tracheal tenderness.  Cardiovascular:     Rate and Rhythm: Normal rate and regular rhythm.     Pulses: Normal pulses.          Carotid pulses are 2+ on the right side and 2+ on the left side.      Radial pulses are 2+ on the right side and 2+ on the left side.       Femoral pulses are 2+ on the right side and 2+ on the left side.      Popliteal pulses are 2+ on the right side and 2+ on the left side.       Dorsalis pedis pulses are 2+ on the right side and 2+ on the left side.       Posterior tibial pulses are 2+ on the right side and 2+ on the left side.     Heart sounds: Normal heart sounds, S1 normal and S2 normal. No murmur heard.    No friction rub. No gallop.  Pulmonary:     Effort: Pulmonary effort is normal. No respiratory distress.     Breath sounds: Normal breath sounds and air entry. No stridor. No wheezing, rhonchi or rales.  Chest:     Chest wall: No tenderness.  Abdominal:     General: Abdomen is flat. Bowel sounds are normal. There is no distension.     Palpations: Abdomen is soft. There is no mass.     Tenderness: There is no abdominal tenderness. There is no guarding or rebound.     Hernia: No hernia is present.  Genitourinary:    Comments: Exam deferred; denies complaints of LUTS, hx of BPH Musculoskeletal:        General: No swelling, tenderness, deformity or signs of injury. Normal range of motion.     Cervical back: Normal range of motion and neck supple. No rigidity or tenderness.     Right lower leg: No edema.     Left lower leg: No edema.  Lymphadenopathy:     Cervical: No cervical adenopathy.     Right cervical: No superficial, deep or posterior cervical  adenopathy.    Left cervical: No superficial, deep or posterior cervical adenopathy.  Skin:    General: Skin is warm and dry.     Capillary Refill: Capillary refill takes less than 2 seconds.     Coloration: Skin is not jaundiced or pale.     Findings: No bruising, erythema, lesion or rash.  Neurological:     General: No focal deficit present.     Mental Status: He is alert and oriented to person, place, and time. Mental status is at baseline.     GCS: GCS eye subscore is 4.  GCS verbal subscore is 5. GCS motor subscore is 6.     Sensory: Sensation is intact. No sensory deficit.     Motor: Motor function is intact. No weakness.     Coordination: Coordination is intact.     Gait: Gait is intact.  Psychiatric:        Attention and Perception: Attention and perception normal.        Mood and Affect: Mood and affect normal.        Speech: Speech normal.        Behavior: Behavior normal. Behavior is cooperative.        Thought Content: Thought content normal.        Cognition and Memory: Cognition normal.        Judgment: Judgment normal.      Last depression screening scores    12/13/2021    2:58 PM 05/15/2021    3:54 PM 05/09/2020    9:54 AM  PHQ 2/9 Scores  PHQ - 2 Score 0 0 0  PHQ- 9 Score 0     Last fall risk screening    12/13/2021    2:58 PM  Fall Risk   Falls in the past year? 0  Number falls in past yr: 0  Injury with Fall? 0   Last Audit-C alcohol use screening    12/13/2021    2:59 PM  Alcohol Use Disorder Test (AUDIT)  1. How often do you have a drink containing alcohol? 0  2. How many drinks containing alcohol do you have on a typical day when you are drinking? 0  3. How often do you have six or more drinks on one occasion? 0  AUDIT-C Score 0   A score of 3 or more in women, and 4 or more in men indicates increased risk for alcohol abuse, EXCEPT if all of the points are from question 1   No results found for any visits on 12/13/21.  Assessment & Plan     Routine Health Maintenance and Physical Exam  Exercise Activities and Dietary recommendations  Goals      DIET - EAT MORE FRUITS AND VEGETABLES     DIET - REDUCE CALORIE INTAKE     Recommend to continue current diet plan of cutting out sodas and junk food to help aid in weight loss.      Exercise 3x per week (30 min per time)     Recommend to exercise for 3 days a week for at least 30 minutes at a time.      Quit Smoking     Recommend to continue efforts to reduce smoking habits until no longer smoking.         Immunization History  Administered Date(s) Administered   Fluad Quad(high Dose 65+) 02/02/2019, 02/23/2020   Influenza Split 02/27/2012   Influenza, High Dose Seasonal PF 02/19/2016, 01/15/2017, 01/21/2018, 02/15/2021   Influenza,inj,Quad PF,6+ Mos 02/18/2014   Influenza-Unspecified 03/07/2015   PFIZER(Purple Top)SARS-COV-2 Vaccination 05/28/2019, 06/18/2019, 02/11/2020   Pfizer Covid-19 Vaccine Bivalent Booster 12yrs & up 02/15/2021   Pneumococcal Conjugate-13 06/08/2014   Pneumococcal Polysaccharide-23 12/16/2011   Td 09/11/2004   Tetanus 10/04/2017   Zoster Recombinat (Shingrix) 07/18/2019   Zoster, Live 02/27/2012    Health Maintenance  Topic Date Due   Zoster Vaccines- Shingrix (2 of 2) 09/12/2019   COVID-19 Vaccine (5 - Pfizer risk series) 04/12/2021   HEMOGLOBIN A1C  11/18/2021   INFLUENZA VACCINE  12/04/2021   OPHTHALMOLOGY EXAM  05/02/2022     Diabetic kidney evaluation - GFR measurement  05/21/2022   Diabetic kidney evaluation - Urine ACR  05/21/2022   FOOT EXAM  05/21/2022   COLONOSCOPY (Pts 45-50yr Insurance coverage will need to be confirmed)  10/31/2023   TETANUS/TDAP  10/05/2027   Pneumonia Vaccine 76 Years old  Completed   Hepatitis C Screening  Completed   HPV VACCINES  Aged Out    Discussed health benefits of physical activity, and encouraged him to engage in regular exercise appropriate for his age and condition.  Problem List Items  Addressed This Visit       Endocrine   Diabetes mellitus type 2 in obese ([place]    DM stable Weight loss noted Body mass index is 31.95 kg/m. Discussed importance of healthy weight management Discussed diet and exercise       Relevant Medications   atorvastatin (LIPITOR) 40 MG tablet   lisinopril (ZESTRIL) 5 MG tablet   Other Relevant Orders   Hemoglobin A1c   Hyperlipidemia associated with type 2 diabetes mellitus (HCC)    Chronic, previously on lipitor 40 mg, unstable with last LDL of 98 Goal of LDL <70 Repeat LP; recommend titration if necessary for ASCVD risk       Relevant Medications   atorvastatin (LIPITOR) 40 MG tablet   lisinopril (ZESTRIL) 5 MG tablet   Other Relevant Orders   Hemoglobin A1c   Type 2 diabetes mellitus with microalbuminuria, without long-term current use of insulin (HCC)    Chronic, stable at 6.6% 6 months ago Repeat A1c  Continue metformin 500 mg       Relevant Medications   atorvastatin (LIPITOR) 40 MG tablet   lisinopril (ZESTRIL) 5 MG tablet   Other Relevant Orders   Hemoglobin A1c     Genitourinary   BPH (benign prostatic hyperplasia)    Chronic, stable Continue flomax to assist       Relevant Orders   PSA     Other   Annual physical exam - Primary    UTD on vision and dental Continues to smoke Has started PT job at [employer] [employer] to do to keep yourself healthy  - Exercise at least 30-45 minutes a day, 3-4 days a week.  - Eat a low-fat diet with lots of fruits and vegetables, up to 7-9 servings per day.  - Seatbelts can save your life. Wear them always.  - Smoke detectors on every level of your home, check batteries every year.  - Eye Doctor - have an eye exam every 1-2 years  - Safe sex - if you may be exposed to STDs, use a condom.  - Alcohol -  If you drink, do it moderately, less than 2 drinks per day.  - [place] Choose someone to speak for you if you are not able.  - Depression  is common in our stressful world.If you're feeling down or losing interest in things you normally enjoy, please come in for a visit.  - Violence - If anyone is threatening or hurting you, please call immediately.        Relevant Orders   Comprehensive metabolic panel   CBC with Differential/Platelet   Lipid panel   TSH + free T4   Atypical chest pain    Acute, variable, self limiting Does not see connection with meals or activity Referral to cardiology Denies current symptoms Denies other associations including but not limited to meal timing, chest pressure, change in heart rate/beat pattern,  stress, caffeine       Relevant Orders   Ambulatory referral to Cardiology   Compulsive tobacco user syndrome    Chronic, stable Remains pre-contemplative regarding cessation       Return in about 6 months (around 06/15/2022) for chonic disease management.    I, Elise T Payne, FNP, have reviewed all documentation for this visit. The documentation on 12/13/21 for the exam, diagnosis, procedures, and orders are all accurate and complete.  Elise T Payne, FNP  [facility] [phone] (phone) [phone] (fax)  [facility]  

## 2021-12-13 ENCOUNTER — Ambulatory Visit (INDEPENDENT_AMBULATORY_CARE_PROVIDER_SITE_OTHER): Payer: Medicare Other | Admitting: Family Medicine

## 2021-12-13 ENCOUNTER — Encounter: Payer: Self-pay | Admitting: Family Medicine

## 2021-12-13 VITALS — BP 113/57 | HR 70 | Temp 98.0°F | Resp 16 | Ht 67.0 in | Wt 204.0 lb

## 2021-12-13 DIAGNOSIS — E1129 Type 2 diabetes mellitus with other diabetic kidney complication: Secondary | ICD-10-CM | POA: Diagnosis not present

## 2021-12-13 DIAGNOSIS — N400 Enlarged prostate without lower urinary tract symptoms: Secondary | ICD-10-CM | POA: Diagnosis not present

## 2021-12-13 DIAGNOSIS — E785 Hyperlipidemia, unspecified: Secondary | ICD-10-CM

## 2021-12-13 DIAGNOSIS — R809 Proteinuria, unspecified: Secondary | ICD-10-CM | POA: Diagnosis not present

## 2021-12-13 DIAGNOSIS — E669 Obesity, unspecified: Secondary | ICD-10-CM

## 2021-12-13 DIAGNOSIS — R0789 Other chest pain: Secondary | ICD-10-CM | POA: Insufficient documentation

## 2021-12-13 DIAGNOSIS — Z0000 Encounter for general adult medical examination without abnormal findings: Secondary | ICD-10-CM | POA: Insufficient documentation

## 2021-12-13 DIAGNOSIS — E1169 Type 2 diabetes mellitus with other specified complication: Secondary | ICD-10-CM | POA: Diagnosis not present

## 2021-12-13 DIAGNOSIS — F17200 Nicotine dependence, unspecified, uncomplicated: Secondary | ICD-10-CM

## 2021-12-13 NOTE — Assessment & Plan Note (Signed)
Chronic, previously on lipitor 40 mg, unstable with last LDL of 98 Goal of LDL <70 Repeat LP; recommend titration if necessary for ASCVD risk

## 2021-12-13 NOTE — Assessment & Plan Note (Signed)
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  - Eat a low-fat diet with lots of fruits and vegetables, up to 7-9 servings per day.  - Seatbelts can save your life. Wear them always.  - Smoke detectors on every level of your home, check batteries every year.  - Eye Doctor - have an eye exam every 1-2 years  - Safe sex - if you may be exposed to STDs, use a condom.  - Alcohol -  If you drink, do it moderately, less than 2 drinks per day.  - [place]. Choose someone to speak for you if you are not able.  - Depression is common in our stressful world.If you're feeling down or losing interest in things you normally enjoy, please come in for a visit.  - Violence - If anyone is threatening or hurting you, please call immediately.

## 2021-12-13 NOTE — Assessment & Plan Note (Signed)
Chronic, stable Remains pre-contemplative regarding cessation

## 2021-12-13 NOTE — Assessment & Plan Note (Signed)
Chronic, stable Continue flomax to assist

## 2021-12-13 NOTE — Assessment & Plan Note (Signed)
Acute, variable, self limiting Does not see connection with meals or activity Referral to cardiology Denies current symptoms Denies other associations including but not limited to meal timing, chest pressure, change in heart rate/beat pattern, stress, caffeine

## 2021-12-13 NOTE — Assessment & Plan Note (Signed)
DM stable Weight loss noted Body mass index is 31.95 kg/m. Discussed importance of healthy weight management Discussed diet and exercise

## 2021-12-13 NOTE — Assessment & Plan Note (Signed)
Chronic, stable at 6.6% 6 months ago Repeat A1c  Continue metformin 500 mg

## 2021-12-14 ENCOUNTER — Encounter: Payer: Self-pay | Admitting: Family Medicine

## 2021-12-14 LAB — COMPREHENSIVE METABOLIC PANEL
ALT: 27 IU/L (ref 0–44)
AST: 22 IU/L (ref 0–40)
Albumin/Globulin Ratio: 2 (ref 1.2–2.2)
Albumin: 4.3 g/dL (ref 3.8–4.8)
Alkaline Phosphatase: 99 IU/L (ref 44–121)
BUN/Creatinine Ratio: 13 (ref 10–24)
BUN: 13 mg/dL (ref 8–27)
Bilirubin Total: 0.4 mg/dL (ref 0.0–1.2)
CO2: 24 mmol/L (ref 20–29)
Calcium: 9.7 mg/dL (ref 8.6–10.2)
Chloride: 101 mmol/L (ref 96–106)
Creatinine, Ser: 0.97 mg/dL (ref 0.76–1.27)
Globulin, Total: 2.1 g/dL (ref 1.5–4.5)
Glucose: 170 mg/dL — ABNORMAL HIGH (ref 70–99)
Potassium: 5 mmol/L (ref 3.5–5.2)
Sodium: 135 mmol/L (ref 134–144)
Total Protein: 6.4 g/dL (ref 6.0–8.5)
eGFR: 81 mL/min/{1.73_m2} (ref >59)

## 2021-12-14 LAB — LIPID PANEL
Chol/HDL Ratio: 3.9 ratio (ref 0.0–5.0)
Cholesterol, Total: 122 mg/dL (ref 100–199)
HDL: 31 mg/dL — ABNORMAL LOW (ref >39)
LDL Chol Calc (NIH): 56 mg/dL (ref 0–99)
Triglycerides: 217 mg/dL — ABNORMAL HIGH (ref 0–149)
VLDL Cholesterol Cal: 35 mg/dL (ref 5–40)

## 2021-12-14 LAB — CBC WITH DIFFERENTIAL/PLATELET
Basophils Absolute: 0.1 10*3/uL (ref 0.0–0.2)
Basos: 1 %
EOS (ABSOLUTE): 0.3 10*3/uL (ref 0.0–0.4)
Eos: 3 %
Hematocrit: 47.6 % (ref 37.5–51.0)
Hemoglobin: 16.2 g/dL (ref 13.0–17.7)
Immature Grans (Abs): 0.1 10*3/uL (ref 0.0–0.1)
Immature Granulocytes: 1 %
Lymphocytes Absolute: 3 10*3/uL (ref 0.7–3.1)
Lymphs: 30 %
MCH: 32.7 pg (ref 26.6–33.0)
MCHC: 34 g/dL (ref 31.5–35.7)
MCV: 96 fL (ref 79–97)
Monocytes Absolute: 0.8 10*3/uL (ref 0.1–0.9)
Monocytes: 8 %
Neutrophils Absolute: 5.8 10*3/uL (ref 1.4–7.0)
Neutrophils: 57 %
Platelets: 221 10*3/uL (ref 150–450)
RBC: 4.96 x10E6/uL (ref 4.14–5.80)
RDW: 11.8 % (ref 11.6–15.4)
WBC: 10 10*3/uL (ref 3.4–10.8)

## 2021-12-14 LAB — HEMOGLOBIN A1C
Est. average glucose Bld gHb Est-mCnc: 143 mg/dL
Hgb A1c MFr Bld: 6.6 % — ABNORMAL HIGH (ref 4.8–5.6)

## 2021-12-14 LAB — PSA: Prostate Specific Ag, Serum: 0.8 ng/mL (ref 0.0–4.0)

## 2021-12-14 LAB — TSH+FREE T4
Free T4: 1.08 ng/dL (ref 0.82–1.77)
TSH: 2.02 u[IU]/mL (ref 0.450–4.500)

## 2021-12-14 NOTE — Progress Notes (Signed)
Chemistry show elevated blood sugar; otherwise, normal.  Cholesterol improved, fats elevated- not uncommon in non fasting labs. HDL is low. Recommend increase in diet of healthier fat choices- low fat meats, oils that are not solid at room temperature, nuts, seeds, fish- cod, halibut, salmon, and avocado. Exercise can also increase this number.  Supplemental omega 3's can be taken as well but are not as helpful as dietary/exercise changes. LDL improved; at goal. I recommend diet low in saturated fat and regular exercise - 30 min at least 5 times per week  Stable diabetes at 6.6%. Continue to recommend balanced, lower carb meals. Smaller meal size, adding snacks. Choosing water as drink of choice and increasing purposeful exercise.  All other labs are normal and stable.  Gwyneth Sprout, [address] [place], [place] [address] [phone] (phone) [phone] (fax) [place]

## 2021-12-25 DIAGNOSIS — G4733 Obstructive sleep apnea (adult) (pediatric): Secondary | ICD-10-CM | POA: Diagnosis not present

## 2021-12-26 ENCOUNTER — Encounter: Payer: Self-pay | Admitting: Family Medicine

## 2021-12-26 DIAGNOSIS — Z1211 Encounter for screening for malignant neoplasm of colon: Secondary | ICD-10-CM

## 2022-01-30 ENCOUNTER — Encounter: Payer: Self-pay | Admitting: Family Medicine

## 2022-01-30 ENCOUNTER — Ambulatory Visit: Payer: Self-pay | Admitting: *Deleted

## 2022-01-30 ENCOUNTER — Ambulatory Visit (INDEPENDENT_AMBULATORY_CARE_PROVIDER_SITE_OTHER): Payer: Medicare Other | Admitting: Family Medicine

## 2022-01-30 VITALS — BP 112/72 | HR 73 | Temp 98.0°F | Resp 16 | Wt 207.7 lb

## 2022-01-30 DIAGNOSIS — T148XXA Other injury of unspecified body region, initial encounter: Secondary | ICD-10-CM

## 2022-01-30 NOTE — Telephone Encounter (Signed)
  Chief Complaint: scrape on elbow Symptoms: scrape from fall Frequency: fell Monday evening Pertinent Negatives: Patient denies   Disposition: '[]'$ ED /'[]'$ Urgent Care (no appt availability in office) / '[x]'$ Appointment(In office/virtual)/ '[]'$  [facility]/ '[]'$ Home Care/ '[]'$ Refused Recommended Disposition /'[]'$ [facility]/ '[]'$  Follow-up with PCP Additional Notes: Patient request appointment for help bandaging wound

## 2022-01-30 NOTE — Telephone Encounter (Signed)
Last Tetanus- 10/04/2017

## 2022-01-30 NOTE — Progress Notes (Signed)
    SUBJECTIVE:   CHIEF COMPLAINT / HPI:   ABRASION Fell Monday night, missed footing while walking down steps and fell down 2-3 concrete steps onto L arm and knee with subsequent abrasions and pain. Has used warm washcloth, neosporin and gauze.  OBJECTIVE:   BP 112/72 (BP Location: Left Arm, Patient Position: Sitting, Cuff Size: Large)   Pulse 73   Temp 98 F (36.7 C) (Oral)   Resp 16   Wt 207 lb 11.2 oz (94.2 kg)   BMI 32.53 kg/m   Gen: well appearing, in NAD Skin: well healed abrasion to L knee with scabbing. Abrasion to L elbow healing with new tissue. No purulence, edema, surrounding erythema or other signs of infection.   ASSESSMENT/PLAN:   Abrasion Well healing. Nonadherent gauze, antibiotic ointment and coban applied. Tylenol/ibuprofen prn for pain. F/u prn.    Myles Gip, DO

## 2022-01-30 NOTE — Telephone Encounter (Signed)
Reason for Disposition  Minor scrape (abrasion)  Answer Assessment - Initial Assessment Questions 1. APPEARANCE of INJURY: "What does the injury look like?"      Scape- 1 inch wide- 2i nches long 2. SIZE: "How large is the cut?"      See above 3. BLEEDING: "Is it bleeding now?" If Yes, ask: "Is it difficult to stop?"      no 4. LOCATION: "Where is the injury located?"      Elbow/knee 5. ONSET: "How long ago did the injury occur?"      Monday evening 6. MECHANISM: "Tell me how it happened."      [place]owler on concrete Patient has laceration and would like help with bandage  Protocols used: Skin Injury-A-AH

## 2022-02-11 ENCOUNTER — Other Ambulatory Visit
Admission: RE | Admit: 2022-02-11 | Discharge: 2022-02-11 | Disposition: A | Discharge status: Home / Self Care | Payer: Medicare Other | Source: Ambulatory Visit | Attending: Cardiology | Admitting: Cardiology

## 2022-02-11 ENCOUNTER — Encounter: Payer: Self-pay | Admitting: Cardiology

## 2022-02-11 ENCOUNTER — Ambulatory Visit: Payer: Medicare Other | Attending: Cardiology | Admitting: Cardiology

## 2022-02-11 VITALS — BP 132/64 | HR 62 | Ht 67.0 in | Wt 211.0 lb

## 2022-02-11 DIAGNOSIS — F17200 Nicotine dependence, unspecified, uncomplicated: Secondary | ICD-10-CM

## 2022-02-11 DIAGNOSIS — E7800 Pure hypercholesterolemia, unspecified: Secondary | ICD-10-CM | POA: Diagnosis not present

## 2022-02-11 DIAGNOSIS — R072 Precordial pain: Secondary | ICD-10-CM | POA: Insufficient documentation

## 2022-02-11 LAB — BASIC METABOLIC PANEL
Anion gap: 4 — ABNORMAL LOW (ref 5–15)
BUN: 13 mg/dL (ref 8–23)
CO2: 28 mmol/L (ref 22–32)
Calcium: 9 mg/dL (ref 8.9–10.3)
Chloride: 106 mmol/L (ref 98–111)
Creatinine, Ser: 0.87 mg/dL (ref 0.61–1.24)
GFR, Estimated: >60 mL/min (ref >60)
Glucose, Bld: 208 mg/dL — ABNORMAL HIGH (ref 70–99)
Potassium: 4.2 mmol/L (ref 3.5–5.1)
Sodium: 138 mmol/L (ref 135–145)

## 2022-02-11 MED ORDER — METOPROLOL TARTRATE 100 MG PO TABS: 100.0000 mg | ORAL_TABLET | Freq: Once | ORAL | 0 refills | Status: DC

## 2022-02-11 MED ORDER — ASPIRIN 81 MG PO TBEC
81.0000 mg | DELAYED_RELEASE_TABLET | Freq: Every day | ORAL | 3 refills | Status: AC
Start: 2022-02-11 — End: ?

## 2022-02-11 NOTE — Patient Instructions (Signed)
Medication Instructions:   Your physician has recommended you make the following change in your medication:   START taking  Aspirin 81 MG once a day.   *If you need a refill on your cardiac medications before your next appointment, please call your pharmacy*   Lab Work:  Please go to the [facility] after your appointment today for a BMP lab draw.   Testing/Procedures:   Your physician has requested that you have an echocardiogram. Echocardiography is a painless test that uses sound waves to create images of your heart. It provides your doctor with information about the size and shape of your heart and how well your heart's chambers and valves are working. This procedure takes approximately one hour. There are no restrictions for this procedure.  2.    Your physician has requested that you have cardiac CT. Cardiac computed tomography (CT) is a painless test that uses an x-ray machine to take clear, detailed pictures of your heart.    Your cardiac CT will be scheduled at:   [facility], [place] [address] [phone]  Thursday 02/18/22 at 1:45  Please arrive 15 mins early for check-in and test prep.   Please follow these instructions carefully (unless otherwise directed):   Hold all erectile dysfunction medications at least 3 days (72 hrs) prior to test. (Ie viagra, cialis, sildenafil, tadalafil, etc) We will administer nitroglycerin during this exam.    Night Before the Test: Be sure to Drink plenty of water. Do not consume any caffeinated/decaffeinated beverages or chocolate 12 hours prior to your test.    On the Day of the Test: Drink plenty of water until 1 hour prior to the test. Do not eat any food 4 hours prior to the test. You may take your regular medications prior to the test.  Take metoprolol (Lopressor) 100 MG two hours prior to test.        After the Test: Drink plenty of water. After receiving IV  contrast, you may experience a mild flushed feeling. This is normal. On occasion, you may experience a mild rash up to 24 hours after the test. This is not dangerous. If this occurs, you can take Benadryl 25 mg and increase your fluid intake. If you experience trouble breathing, this can be serious. If it is severe call 911 IMMEDIATELY. If it is mild, please call our office. If you take any of these medications: Glipizide/Metformin, Avandament, Glucavance, please do not take 48 hours after completing test unless otherwise instructed.  Please allow 2-4 weeks for scheduling of routine cardiac CTs. Some insurance companies require a pre-authorization which may delay scheduling of this test.   For non-scheduling related questions, please contact the cardiac imaging nurse navigator should you have any questions/concerns: Marchia Bond, Cardiac Imaging Nurse Navigator Gordy Clement, Cardiac Imaging Nurse Navigator [facility] Direct Office Dial: [phone]   For scheduling needs, including cancellations and rescheduling, please call [place], [phone].      Follow-Up: At [facility], you and your health needs are our priority.  As part of our continuing mission to provide you with exceptional heart care, we have created designated Provider Care Teams.  These Care Teams include your primary Cardiologist (physician) and Advanced Practice Providers (APPs -  Physician Assistants and Nurse Practitioners) who all work together to provide you with the care you need, when you need it.  We recommend signing up for the patient portal called "MyChart".  Sign up information is provided on this After Visit Summary.  MyChart is used to connect with patients for Virtual Visits (Telemedicine).  Patients are able to view lab/test results, encounter notes, upcoming appointments, etc.  Non-urgent messages can be sent to your provider as well.   To learn more about what you can  do with MyChart, go to [email or web address].    Your next appointment:   Follow up after testing   The format for your next appointment:   In Person  Provider:   You may see Kate Sable, MD or one of the following Advanced Practice Providers on your designated Care Team:   Murray Hodgkins, NP Christell Faith, PA-C Cadence Kathlen Mody, PA-C Gerrie Nordmann, NP    Other Instructions   Important Information About Sugar

## 2022-02-11 NOTE — Progress Notes (Signed)
Cardiology Office Note:    Date:  02/11/2022   ID:  David Mutter., DOB [birth year], MRN [id number]  PCP:  Virginia Crews, MD   [place] Prov[facility] Cardiologist:  Kate Sable, MD     Referring MD: Gwyneth Sprout, FNP   Chief Complaint  Patient presents with   New Patient (Initial Visit)    2nd opinion, Chest pain discomfort 4-5 months    History of Present Illness:    David Sulkowski. is a 76 y.o. male with a hx of hyperlipidemia, diabetes, current smoker x50+ years who presents due to chest pain.  States having persistent chest pain about 6 months ago not associated with exertion.  Symptoms resolved momentarily, this past weekend while sitting at home noticed another episode of chest pain lasting 6 hours.  Rates pain as 3 out of 10 in severity, describes pain as a dull discomfort behind his sternum.  Had an episode of chest pain 20 years ago, underwent exercise treadmill test which was normal.  Denies any family history of heart disease.  He takes lisinopril due to proteinuria, blood pressure is usually controlled.  Was also told he is prediabetic.  Past Medical History:  Diagnosis Date   Actinic keratosis 01/13/2017   R upper back post base of neck - biopsy proven    Allergy    COPD (chronic obstructive pulmonary disease) ([place])    misdiagnosed   Depression    Diabetes mellitus without complication ([place])    Dysplastic nevus 12/23/2007   R med mid post thigh - slight atypia   Dysplastic nevus 12/22/2008   L mid ant thigh - slight atypia   Dysplastic nevus 05/10/2020   RUQA lat, severia atypia, excised 07/11/20   Hyperlipidemia    Hypertension    Melanoma ([place]) 1988   MM - clavicle, txted at [facility]   Microalbuminuria    Sleep apnea    Squamous cell carcinoma of skin 07/18/2020   nasal tip - Aypical Squamous Proliferation with Desmoplasia. Atypical squamous nests are concerning for invasive SCC. MOHs 09/11/2020    Past Surgical History:   Procedure Laterality Date   broken right arm     CHOLECYSTECTOMY  2001   COLONOSCOPY WITH PROPOFOL N/A 05/08/2018   Procedure: COLONOSCOPY WITH PROPOFOL;  Surgeon: Manya Silvas, MD;  Location: [facility] ENDOSCOPY;  Service: Endoscopy;  Laterality: N/A;   COLONOSCOPY WITH PROPOFOL N/A 10/30/2020   Procedure: COLONOSCOPY WITH PROPOFOL;  Surgeon: Toledo, Benay Pike, MD;  Location: [facility] ENDOSCOPY;  Service: Gastroenterology;  Laterality: N/A;   MELANOMA EXCISION  1998   TONSILLECTOMY AND ADENOIDECTOMY  1953    Current Medications: Current Meds  Medication Sig   aspirin EC 81 MG tablet Take 1 tablet (81 mg total) by mouth daily. Swallow whole.   atorvastatin (LIPITOR) 40 MG tablet Take 1 tablet by mouth daily.   fluticasone (FLONASE) 50 MCG/ACT nasal spray Place 2 sprays into both nostrils daily.   lisinopril (ZESTRIL) 5 MG tablet Take 1 tablet by mouth daily.   loratadine (CLARITIN) 10 MG tablet Take 1 tablet (10 mg total) by mouth daily.   meloxicam (MOBIC) 15 MG tablet Take 1 tablet (15 mg total) by mouth as needed. (Patient taking differently: Take 15 mg by mouth as needed. Rarely needs)   metFORMIN (GLUCOPHAGE) 500 MG tablet TAKE 1 TABLET(500 MG) BY MOUTH AT BEDTIME   metoprolol tartrate (LOPRESSOR) 100 MG tablet Take 1 tablet (100 mg total) by mouth once for 1  dose. Take 2 hours prior to your CT scan.   tamsulosin (FLOMAX) 0.4 MG CAPS capsule TAKE 1 CAPSULE(0.4 MG) BY MOUTH DAILY     Allergies:   Alcohol   Social History   Socioeconomic History   Marital status: Married    Spouse name: Not on file   Number of children: 2   Years of education: College   Highest education level: Bachelor's degree (e.g., BA, AB, BS)  Occupational History    Employer: [employer]    Comment: retired  Tobacco Use   Smoking status: Every Day    Packs/day: 1.00    Years: 52.00    Total pack years: 52.00    Types: Cigarettes   Smokeless tobacco: Never   Tobacco comments:    Pt started smoking again.  Is smoking 1.25-1.5 PPD  Vaping Use   Vaping Use: Never used  Substance and Sexual Activity   Alcohol use: Not Currently    Comment: Previous Heavy Alcohol Abuse, sober   Drug use: Not Currently   Sexual activity: Not on file  Other Topics Concern   Not on file  Social History Narrative   Not on file   Social Determinants of Health   Financial Resource Strain: Low Risk  (05/15/2021)   Overall Financial Resource Strain (CARDIA)    Difficulty of Paying Living Expenses: Not hard at all  Food Insecurity: No Food Insecurity (05/15/2021)   Hunger Vital Sign    Worried About Running Out of Food in the Last Year: Never true    Ran Out of Food in the Last Year: Never true  Transportation Needs: No Transportation Needs (05/15/2021)   PRAPARE - Hydrologist (Medical): No    Lack of Transportation (Non-Medical): No  Physical Activity: Inactive (05/15/2021)   Exercise Vital Sign    Days of Exercise per Week: 0 days    Minutes of Exercise per Session: 0 min  Stress: No Stress Concern Present (05/15/2021)   [place]    Feeling of Stress : Not at all  Social Connections: [place] (05/15/2021)   Social Connection and Isolation Panel [NHANES]    Frequency of Communication with Friends and Family: More than three times a week    Frequency of Social Gatherings with Friends and Family: More than three times a week    Attends Religious Services: More than 4 times per year    Active Member of [employer] or Organizations: Yes    Attends Music therapist: More than 4 times per year    Marital Status: Married     Family History: The patient's family history includes Cervical cancer in his maternal aunt, maternal grandmother, and mother; Colon cancer in his mother; Colon polyps in his brother; Diabetes in his maternal grandmother; Lung cancer in his maternal grandfather; Tuberculosis in his  maternal grandmother.  ROS:   Please see the history of present illness.     All other systems reviewed and are negative.  EKGs/Labs/Other Studies Reviewed:    The following studies were reviewed today:   EKG:  EKG is  ordered today.  The ekg ordered today demonstrates normal sinus rhythm  Recent Labs: 12/13/2021: ALT 27; BUN 13; Creatinine, Ser 0.97; Hemoglobin 16.2; Platelets 221; Potassium 5.0; Sodium 135; TSH 2.020  Recent Lipid Panel    Component Value Date/Time   CHOL 122 12/13/2021 1526   TRIG 217 (H) 12/13/2021 1526   HDL 31 (L)  12/13/2021 1526   CHOLHDL 3.9 12/13/2021 1526   LDLCALC 56 12/13/2021 1526     Risk Assessment/Calculations:             Physical Exam:    VS:  BP 132/64 (BP Location: Left Arm, Patient Position: Sitting, Cuff Size: Normal)   Pulse 62   Ht '5\' 7"'$  (1.702 m)   Wt 211 lb (95.7 kg)   SpO2 99%   BMI 33.05 kg/m     Wt Readings from Last 3 Encounters:  02/11/22 211 lb (95.7 kg)  01/30/22 207 lb 11.2 oz (94.2 kg)  12/13/21 204 lb (92.5 kg)     GEN:  Well nourished, well developed in no acute distress HEENT: Normal NECK: No JVD; No carotid bruits CARDIAC: RRR, no murmurs, rubs, gallops RESPIRATORY:  Clear to auscultation without rales, wheezing or rhonchi  ABDOMEN: Soft, non-tender, non-distended MUSCULOSKELETAL:  No edema; No deformity  SKIN: Warm and dry NEUROLOGIC:  Alert and oriented x 3 PSYCHIATRIC:  Normal affect   ASSESSMENT:    1. Precordial pain   2. Pure hypercholesterolemia   3. Smoking    PLAN:    In order of problems listed above:  Chest pain, risk factors hyperlipidemia, current smoker.  Get echocardiogram, get coronary CTA.  Start aspirin 81 mg daily.  Continue Lipitor 40 mg. Hyperlipidemia, continue Lipitor 40 mg daily. Current smoker, smoking cessation advised.  Follow-up after cardiac testing      Medication Adjustments/Labs and Tests Ordered: Current medicines are reviewed at length with the patient  today.  Concerns regarding medicines are outlined above.  Orders Placed This Encounter  Procedures   CT CORONARY MORPH W/CTA COR W/SCORE W/CA W/CM &/OR WO/CM   Basic metabolic panel   EKG [id number]   ECHOCARDIOGRAM COMPLETE   Meds ordered this encounter  Medications   aspirin EC 81 MG tablet    Sig: Take 1 tablet (81 mg total) by mouth daily. Swallow whole.    Dispense:  90 tablet    Refill:  3   metoprolol tartrate (LOPRESSOR) 100 MG tablet    Sig: Take 1 tablet (100 mg total) by mouth once for 1 dose. Take 2 hours prior to your CT scan.    Dispense:  1 tablet    Refill:  0    Patient Instructions  Medication Instructions:   Your physician has recommended you make the following change in your medication:   START taking  Aspirin 81 MG once a day.   *If you need a refill on your cardiac medications before your next appointment, please call your pharmacy*   Lab Work:  Please go to the [facility] after your appointment today for a BMP lab draw.   Testing/Procedures:   Your physician has requested that you have an echocardiogram. Echocardiography is a painless test that uses sound waves to create images of your heart. It provides your doctor with information about the size and shape of your heart and how well your heart's chambers and valves are working. This procedure takes approximately one hour. There are no restrictions for this procedure.  2.    Your physician has requested that you have cardiac CT. Cardiac computed tomography (CT) is a painless test that uses an x-ray machine to take clear, detailed pictures of your heart.    Your cardiac CT will be scheduled at:   [facility] [place], [place] [address] [phone]  Thursday 02/18/22 at 1:45  Please arrive 62  mins early for check-in and test prep.   Please follow these instructions carefully (unless otherwise directed):   Hold all erectile dysfunction medications  at least 3 days (72 hrs) prior to test. (Ie viagra, cialis, sildenafil, tadalafil, etc) We will administer nitroglycerin during this exam.    Night Before the Test: Be sure to Drink plenty of water. Do not consume any caffeinated/decaffeinated beverages or chocolate 12 hours prior to your test.    On the Day of the Test: Drink plenty of water until 1 hour prior to the test. Do not eat any food 4 hours prior to the test. You may take your regular medications prior to the test.  Take metoprolol (Lopressor) 100 MG two hours prior to test.        After the Test: Drink plenty of water. After receiving IV contrast, you may experience a mild flushed feeling. This is normal. On occasion, you may experience a mild rash up to 24 hours after the test. This is not dangerous. If this occurs, you can take Benadryl 25 mg and increase your fluid intake. If you experience trouble breathing, this can be serious. If it is severe call 911 IMMEDIATELY. If it is mild, please call our office. If you take any of these medications: Glipizide/Metformin, Avandament, Glucavance, please do not take 48 hours after completing test unless otherwise instructed.  Please allow 2-4 weeks for scheduling of routine cardiac CTs. Some insurance companies require a pre-authorization which may delay scheduling of this test.   For non-scheduling related questions, please contact the cardiac imaging nurse navigator should you have any questions/concerns: Marchia Bond, Cardiac Imaging Nurse Navigator Gordy Clement, Cardiac Imaging Nurse Navigator [facility] Direct Office Dial: [phone]   For scheduling needs, including cancellations and rescheduling, please call [place], [phone].      Follow-Up: At [facility], you and your health needs are our priority.  As part of our continuing mission to provide you with exceptional heart care, we have created designated Provider Care  Teams.  These Care Teams include your primary Cardiologist (physician) and Advanced Practice Providers (APPs -  Physician Assistants and Nurse Practitioners) who all work together to provide you with the care you need, when you need it.  We recommend signing up for the patient portal called "MyChart".  Sign up information is provided on this After Visit Summary.  MyChart is used to connect with patients for Virtual Visits (Telemedicine).  Patients are able to view lab/test results, encounter notes, upcoming appointments, etc.  Non-urgent messages can be sent to your provider as well.   To learn more about what you can do with MyChart, go to [email or web address].    Your next appointment:   Follow up after testing   The format for your next appointment:   In Person  Provider:   You may see Kate Sable, MD or one of the following Advanced Practice Providers on your designated Care Team:   Murray Hodgkins, NP Christell Faith, PA-C Cadence Kathlen Mody, PA-C Gerrie Nordmann, NP    Other Instructions   Important Information About Sugar         Signed, Kate Sable, MD  02/11/2022 2:54 PM    [place]

## 2022-02-15 ENCOUNTER — Encounter (HOSPITAL_COMMUNITY): Payer: Self-pay

## 2022-02-15 ENCOUNTER — Telehealth (HOSPITAL_COMMUNITY): Payer: Self-pay | Admitting: *Deleted

## 2022-02-15 NOTE — Telephone Encounter (Signed)
Reaching out to patient to offer assistance regarding upcoming cardiac imaging study; pt verbalizes understanding of appt date/time, parking situation and where to check in,  medications ordered, and verified current allergies; name and call back number provided for further questions should they arise  David Clement RN Navigator Cardiac [place] [facility] [phone] office [phone] cell  Patient to hold his lisinopril and only take half of the metoprolol tablet if his HR is less than 70bpm two hours prior to his cardiac CT scan.

## 2022-02-18 ENCOUNTER — Ambulatory Visit
Admission: RE | Admit: 2022-02-18 | Discharge: 2022-02-18 | Disposition: A | Discharge status: Home / Self Care | Payer: Medicare Other | Source: Ambulatory Visit | Attending: Cardiology | Admitting: Cardiology

## 2022-02-18 DIAGNOSIS — R072 Precordial pain: Secondary | ICD-10-CM | POA: Insufficient documentation

## 2022-02-18 MED ORDER — DILTIAZEM HCL 25 MG/5ML IV SOLN
INTRAVENOUS | Status: AC
  Filled 2022-02-18: qty 5

## 2022-02-18 MED ORDER — DILTIAZEM HCL 25 MG/5ML IV SOLN
5.0000 mg | Freq: Once | INTRAVENOUS | Status: AC
  Administered 2022-02-18: 5 mg via INTRAVENOUS

## 2022-02-18 MED ORDER — NITROGLYCERIN 0.4 MG SL SUBL
0.8000 mg | SUBLINGUAL_TABLET | Freq: Once | SUBLINGUAL | Status: AC
  Administered 2022-02-18: 0.8 mg via SUBLINGUAL

## 2022-02-18 MED ORDER — DILTIAZEM LOAD VIA INFUSION: 5.0000 mg | Freq: Once | INTRAVENOUS | Status: DC

## 2022-02-18 MED ORDER — IOHEXOL 350 MG/ML SOLN
100.0000 mL | Freq: Once | INTRAVENOUS | Status: AC | PRN
  Administered 2022-02-18: 100 mL via INTRAVENOUS

## 2022-02-18 NOTE — Progress Notes (Signed)
Patient tolerated procedure well. Ambulate w/o difficulty. Denies any lightheadedness or being dizzy. Pt denies any pain at this time. Sitting in chair, drinking water provided. P is encouraged to drink additional water throughout the day and reason explained to patient. Patient verbalized understanding and all questions answered. ABC intact. No further needs at this time. Discharge from procedure area w/o issues.  °

## 2022-03-31 DIAGNOSIS — G4733 Obstructive sleep apnea (adult) (pediatric): Secondary | ICD-10-CM | POA: Diagnosis not present

## 2022-04-02 ENCOUNTER — Ambulatory Visit (INDEPENDENT_AMBULATORY_CARE_PROVIDER_SITE_OTHER): Payer: Medicare Other | Admitting: Family Medicine

## 2022-04-02 ENCOUNTER — Encounter: Payer: Self-pay | Admitting: Family Medicine

## 2022-04-02 ENCOUNTER — Ambulatory Visit: Payer: Medicare Other | Admitting: Family Medicine

## 2022-04-02 VITALS — BP 121/64 | HR 77 | Resp 16 | Wt 207.0 lb

## 2022-04-02 DIAGNOSIS — L0591 Pilonidal cyst without abscess: Secondary | ICD-10-CM | POA: Diagnosis not present

## 2022-04-02 MED ORDER — DOXYCYCLINE HYCLATE 100 MG PO TABS: 100.0000 mg | ORAL_TABLET | Freq: Two times a day (BID) | ORAL | 0 refills | Status: AC

## 2022-04-02 NOTE — Progress Notes (Signed)
    SUBJECTIVE:   CHIEF COMPLAINT / HPI:   LUMP/CYST - h/o pilonidal cysts over the years, usually gets every other year or so. - felt painful lump on medial buttock last week, became very painful on Thursday with difficulties sitting down. - has had some relief in excruiating pain but pressure still there - has not noticed any drainage.   - no fevers - has tried topical antibiotic ointment without relief, no other meds tried.  - no changes in bowel habits    OBJECTIVE:   BP 121/64 (BP Location: Right Arm, Patient Position: Sitting, Cuff Size: Large)   Pulse 77   Resp 16   Wt 207 lb (93.9 kg)   SpO2 100%   BMI 32.42 kg/m   Gen: well appearing, in NAD Skin: ~1cm fluctuant cyst to medial left buttock with surrounding erythema and induration and deeper palpable cysts. Small pinhole just inferior to this without obvious drainage.     ASSESSMENT/PLAN:   Pilonidal cyst Rx doxycycline x7 days. Refer to Gen Surg for lance/removal.     Myles Gip, DO

## 2022-04-04 ENCOUNTER — Encounter: Payer: Self-pay | Admitting: Surgery

## 2022-04-04 ENCOUNTER — Other Ambulatory Visit: Payer: Self-pay

## 2022-04-04 ENCOUNTER — Ambulatory Visit: Payer: Medicare Other | Admitting: Surgery

## 2022-04-04 VITALS — BP 134/74 | HR 80 | Temp 98.2°F | Ht 67.0 in | Wt 206.0 lb

## 2022-04-04 DIAGNOSIS — L0591 Pilonidal cyst without abscess: Secondary | ICD-10-CM | POA: Diagnosis not present

## 2022-04-04 NOTE — Progress Notes (Signed)
Patient ID: David Mutter., male   DOB: Dec 17, 1945, 76 y.o.   MRN: [id number]  Chief Complaint: Pilonidal cyst  History of Present Illness David [name]. is a 76 y.o. 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  Had I&D, twice, currently taking antibiotics for flareup that began 2 days ago.  Denies any current drainage.  Has had intermittent tenderness exacerbated by direct pressure on multiple occasions.  Has no prior history of surgery of the area.  Now wants to proceed with more definitive excisional treatment.  Past Medical History Past Medical History:  Diagnosis Date   Actinic keratosis 01/13/2017   R upper back post base of neck - biopsy proven    Allergy    COPD (chronic obstructive pulmonary disease) ([place])    misdiagnosed   Depression    Diabetes mellitus without complication ([place])    Dysplastic nevus 12/23/2007   R med mid post thigh - slight atypia   Dysplastic nevus 12/22/2008   L mid ant thigh - slight atypia   Dysplastic nevus 05/10/2020   RUQA lat, severia atypia, excised 07/11/20   Hyperlipidemia    Hypertension    Melanoma ([place]) 1988   MM - clavicle, txted at [facility]   Microalbuminuria    Sleep apnea    Squamous cell carcinoma of skin 07/18/2020   nasal tip - Aypical Squamous Proliferation with Desmoplasia. Atypical squamous nests are concerning for invasive SCC. MOHs 09/11/2020      Past Surgical History:  Procedure Laterality Date   broken right arm     CHOLECYSTECTOMY  2001   COLONOSCOPY WITH PROPOFOL N/A 05/08/2018   Procedure: COLONOSCOPY WITH PROPOFOL;  Surgeon: Manya Silvas, MD;  Location: [facility] ENDOSCOPY;  Service: Endoscopy;  Laterality: N/A;   COLONOSCOPY WITH PROPOFOL N/A 10/30/2020   Procedure: COLONOSCOPY WITH PROPOFOL;  Surgeon: Toledo, Benay Pike, MD;  Location: [facility] ENDOSCOPY;  Service: Gastroenterology;  Laterality: N/A;   MELANOMA EXCISION  1998   TONSILLECTOMY AND  ADENOIDECTOMY  1953    Allergies  Allergen Reactions   Alcohol Other (See Comments)    Addiction    Current Outpatient Medications  Medication Sig Dispense Refill   aspirin EC 81 MG tablet Take 1 tablet (81 mg total) by mouth daily. Swallow whole. 90 tablet 3   atorvastatin (LIPITOR) 40 MG tablet Take 1 tablet by mouth daily.     doxycycline (VIBRA-TABS) 100 MG tablet Take 1 tablet (100 mg total) by mouth 2 (two) times daily for 7 days. 14 tablet 0   fluticasone (FLONASE) 50 MCG/ACT nasal spray Place 2 sprays into both nostrils daily. 48 g 3   lisinopril (ZESTRIL) 5 MG tablet Take 1 tablet by mouth daily.     loratadine (CLARITIN) 10 MG tablet Take 1 tablet (10 mg total) by mouth daily. 90 tablet 3   meloxicam (MOBIC) 15 MG tablet Take 1 tablet (15 mg total) by mouth as needed. (Patient taking differently: Take 15 mg by mouth as needed. Rarely needs) 90 tablet 3   metFORMIN (GLUCOPHAGE) 500 MG tablet TAKE 1 TABLET(500 MG) BY MOUTH AT BEDTIME 90 tablet 1   tamsulosin (FLOMAX) 0.4 MG CAPS capsule TAKE 1 CAPSULE(0.4 MG) BY MOUTH DAILY 90 capsule 1   No current facility-administered medications for this visit.    Family History Family History  Problem Relation Age of Onset   Cervical cancer Mother    Colon cancer Mother    Colon polyps Brother  Cervical cancer Maternal Grandmother    Diabetes Maternal Grandmother    Tuberculosis Maternal Grandmother    Lung cancer Maternal Grandfather    Cervical cancer Maternal Aunt       Social History Social History   Tobacco Use   Smoking status: Every Day    Packs/day: 1.00    Years: 52.00    Total pack years: 52.00    Types: Cigarettes   Smokeless tobacco: Never   Tobacco comments:    Pt started smoking again. Is smoking 1.25-1.5 PPD  Vaping Use   Vaping Use: Never used  Substance Use Topics   Alcohol use: Not Currently    Comment: Previous Heavy Alcohol Abuse, sober   Drug use: Not Currently        Review of Systems   Constitutional: Negative.   HENT: Negative.    Eyes: Negative.   Respiratory:  Positive for cough.   Cardiovascular: Negative.   Gastrointestinal: Negative.   Genitourinary: Negative.   Skin: Negative.   Neurological: Negative.   Psychiatric/Behavioral: Negative.       Physical Exam Blood pressure 134/74, pulse 80, temperature 98.2 F (36.8 C), temperature source Oral, height '5\' 7"'$  (1.702 m), weight 206 lb (93.4 kg), SpO2 99 %. Last Weight  Most recent update: 04/04/2022 11:11 AM    Weight  93.4 kg (206 lb)             CONSTITUTIONAL: Well developed, and nourished, appropriately responsive and aware without distress.   EYES: Sclera non-icteric.   EARS, NOSE, MOUTH AND THROAT: Mask worn.   The oropharynx is clear. Oral mucosa is pink and moist.    Hearing is intact to voice.  NECK: Trachea is midline, and there is no jugular venous distension.  LYMPH NODES:  Lymph nodes in the neck are not appreciated. RESPIRATORY:  Lungs are clear, and breath sounds are equal bilaterally. Normal respiratory effort without pathologic use of accessory muscles. CARDIOVASCULAR: Heart is regular in rate and rhythm.  Well perfused.  GI: The abdomen is soft, nontender, and nondistended. There were no palpable masses. I did not appreciate hepatosplenomegaly. There were normal bowel sounds. GU: [place] cleft there is an irregular area of what feels like chronic cystic lesion.  There is only 1 area that appears scarred.  There are no significant central/midline pits.  There is no evidence of induration or edema.  No discharge, no marked tenderness with palpation. MUSCULOSKELETAL:  Symmetrical muscle tone appreciated in all four extremities.    SKIN: Skin turgor is normal. No pathologic skin lesions appreciated.  NEUROLOGIC:  Motor and sensation appear grossly normal.  Cranial nerves are grossly without defect. PSYCH:  Alert and oriented to person, place and time. Affect is appropriate for  situation.  Data Reviewed I have personally reviewed what is currently available of the patient's imaging, recent labs and medical records.   Labs:     Latest Ref Rng & Units 12/13/2021    3:26 PM 01/19/2015    8:48 AM 04/01/2012    6:03 AM  CBC  WBC 3.4 - 10.8 x10E3/uL 10.0  8.3  7.1   Hemoglobin 13.0 - 17.7 g/dL 16.2  14.1  12.5   Hematocrit 37.5 - 51.0 % 47.6  41.7  36.1   Platelets 150 - 450 x10E3/uL 221  245  184       Latest Ref Rng & Units 02/11/2022    2:46 PM 12/13/2021    3:26 PM 05/21/2021   10:04 AM  CMP  Glucose 70 - 99 mg/dL 208  170  171   BUN 8 - 23 mg/dL '13  13  13   '$ Creatinine 0.61 - 1.24 mg/dL 0.87  0.97  0.98   Sodium 135 - 145 mmol/L 138  135  140   Potassium 3.5 - 5.1 mmol/L 4.2  5.0  5.6   Chloride 98 - 111 mmol/L 106  101  103   CO2 22 - 32 mmol/L '28  24  24   '$ Calcium 8.9 - 10.3 mg/dL 9.0  9.7  9.9   Total Protein 6.0 - 8.5 g/dL  6.4  6.4   Total Bilirubin 0.0 - 1.2 mg/dL  0.4  0.6   Alkaline Phos 44 - 121 IU/L  99  95   AST 0 - 40 IU/L  22  24   ALT 0 - 44 IU/L  27  38       Imaging: Radiological images reviewed:   Within last 24 hrs: No results found.  Assessment    Pilonidal cystic disease Patient Active Problem List   Diagnosis Date Noted   Annual physical exam 12/13/2021   Diabetes mellitus type 2 in obese ([place]) 12/13/2021   Atypical chest pain 12/13/2021   Cellulitis 08/28/2021   Dermatitis due to plants, including poison ivy, sumac, and oak 08/28/2021   Recurrent AOM (acute otitis media) 01/25/2021   Caregiver stress 10/20/2017   BPH (benign prostatic hyperplasia) 10/21/2014   COPD, mild ([place]) 10/21/2014   Type 2 diabetes mellitus with microalbuminuria, without long-term current use of insulin ([place]) 10/21/2014   Failure of erection 10/21/2014   Hyperlipidemia associated with type 2 diabetes mellitus ([place]) 10/21/2014   Microalbuminuria due to type 2 diabetes mellitus ([place]) 10/21/2014   Obesity 10/21/2014   Allergic rhinitis  10/21/2014   Apnea, sleep 10/21/2014   Compulsive tobacco user syndrome 10/21/2014    Plan    We discussed options, landing on excision with secondary healing aided by negative pressure wound therapy. As he would like to keep working, he thought the negative pressure therapy may be the best assistance to make good progress while still being ambulatory.  Risks of anesthesia, anesthesia positioning, bleeding, infection, recurrence, etc. discussed in detail.  I believe he understands these are not all inclusive.  Questions have been answered.  No guarantees ever expressed or implied.  Face-to-face time spent with the patient and accompanying care providers(if present) was 30 minutes, with more than 50% of the time spent counseling, educating, and coordinating care of the patient.    These notes generated with voice recognition software. I apologize for typographical errors.  Ronny Bacon M.D., FACS 04/04/2022, 1:12 PM

## 2022-04-04 NOTE — Patient Instructions (Addendum)
Our surgery scheduler will call you within 24-48 hours to schedule your surgery. Please have the [place] surgery sheet available when speaking with her.     Pilonidal Cyst Removal Pilonidal cyst removal is a procedure to remove a fluid-filled sac (cyst) that forms under the skin near the tailbone, at the top of the crease between the buttocks (pilonidal area). This procedure is also called a pilonidal cystectomy.  Pilonidal cyst is caused by an ingrown hair that irritates the area. Sometimes a tunnel (sinus) forms under the skin from the cyst and makes a second opening in the skin. In that case, the sinus area may also be removed during the procedure. You may need this procedure if you have a cyst that is large, painful, or keeps getting infected. A cyst that becomes infected is called an abscess. The abscess may need to be opened, drained, and treated with antibiotics before the cyst is removed. Tell your health care provider about: Any allergies you have. All medicines you are taking, including vitamins, herbs, eye drops, creams, and over-the-counter medicines. Any problems you or family members have had with anesthetic medicines. Any bleeding problems you have. Any surgeries you have had. Any medical conditions you have. Whether you are pregnant or may be pregnant. Any recent fever, increase in pain, or discharge from the cyst. What are the risks? Your health care provider will talk with you about risks. These may include: Delay in healing. This is the most common problem. Infection. Bleeding. Allergic reactions to medicines. A closed incision opening. The cyst coming back again (recurrence). What happens before the procedure? When to stop eating and drinking Follow instructions from your health care provider about what you may eat and drink. These may include: 8 hours before your procedure Stop eating most foods. Do not eat meat, fried foods, or fatty foods. Eat only light foods, such  as toast or crackers. All liquids are okay except energy drinks and alcohol. 6 hours before your procedure Stop eating. Drink only clear liquids, such as water, clear fruit juice, black coffee, plain tea, and sports drinks. Do not drink energy drinks or alcohol. 2 hours before your procedure Stop drinking all liquids. You may be allowed to take medicines with small sips of water. If you do not follow your health care provider's instructions, your procedure may be delayed or canceled. Medicines Ask your health care provider about: Changing or stopping your regular medicines. These include any diabetes medicines or blood thinners you take. Taking medicines such as aspirin and ibuprofen. These medicines can thin your blood. Do not take them unless your health care provider tells you to. Taking over-the-counter medicines, vitamins, herbs, and supplements. Surgery safety Ask your health care provider: How your surgery site will be marked. What steps will be taken to help prevent infection. These steps may include: Removing hair at the surgery site. Washing skin with a soap that kills germs. Taking antibiotics. General instructions Do not use any products that contain nicotine or tobacco for at least 4 weeks before the procedure. These products include cigarettes, chewing tobacco, and vaping devices, such as e-cigarettes. If you need help quitting, ask your health care provider. If you will be going home right after the procedure, plan to have a responsible adult: Take you home from the hospital or clinic. You will not be allowed to drive. Care for you for the time you are told. You may need help with wound care and dressing changes. What happens during the procedure?  An IV will be inserted into one of your veins. You may be given: A sedative. This helps you relax. Anesthesia. This will: Numb certain areas of your body. Make you fall asleep for surgery. Your surgeon will make an  incision near the cyst. Depending on the size of the cyst and if the sinus is infected, one of the following will be done: If there is an abscess, a small hole will be made in the cyst. The pus will be drained out. If the sinus is large or keeps getting infected, your surgeon may: Cut out the sinus and remove some of the skin around it. The wound will be left open to heal on its own. Remove the sinus and cut out a flap on either side of it. The two sides will be stitched together. A thin, flexible tube with a camera (endoscope) may be used before this procedure to better see the area. The surgeon may remove hair and infected tissue. The sinus will then be cleaned with a solution. Heat will be used to seal the sinus. The incision may be left open or closed. An open incision may be packed with gauze and covered with a bandage (dressing). An incision may be closed with stitches (sutures) and covered with a dressing. The area may be sealed with fibrin glue and covered with a dressing. The procedure may vary among health care providers and hospitals. What happens after the procedure? Your blood pressure, heart rate, breathing rate, and blood oxygen level will be monitored until you leave the hospital or clinic. You will be given medicine for pain as needed. If you were given a sedative during the procedure, it can affect you for several hours. Do not drive or operate machinery until your health care provider says that it is safe. Your health care provider will give you instructions for taking care of your dressing at home after the procedure. If your incision was left open and packed with gauze, you will need to change your dressing every day. Summary Pilonidal cyst removal is surgery to remove a fluid-filled sac (cyst) that forms in the crease between the buttocks. The incision used to remove the cyst may be closed with sutures or left open. If left open, it may be packed with gauze and covered with a  dressing. You will be given medicine for pain as needed. Your health care provider will give you instructions for taking care of your dressing at home. This information is not intended to replace advice given to you by your health care provider. Make sure you discuss any questions you have with your health care provider. Document Revised: 07/27/2021 Document Reviewed: 07/27/2021 Elsevier Patient Education  [place].     Pilonidal Cyst  A pilonidal cyst is a fluid-filled sac that forms under the skin near the tailbone, at the top of the crease of the buttocks (pilonidal area). Cysts that are small and not infected may not cause any problems. Cysts that become irritated or infected may grow and fill with pus. An infected cyst is called an abscess. A pilonidal abscess may cause pain and swelling. It may need to be drained or removed. What are the causes? The cause of this condition is not always known. In some cases, it may be caused by a hair that grows into your skin (ingrown hair). What increases the risk? You are more likely to develop this condition if: You are male. You have lots of hair near the crease of the  buttocks. You are overweight. You have a dimple near the crease of the buttocks. You wear tight clothing. You do not bathe or shower often. You sit for long periods of time. What are the signs or symptoms? Symptoms of this condition may include pain, swelling, redness, and warmth in the pilonidal area. You may also be able to feel a lump near your tailbone if your cyst is big. If your cyst becomes infected, symptoms may include: Pus or fluid drainage. Fever. Pain, swelling, and redness getting worse. The lump getting bigger. How is this diagnosed? This condition may be diagnosed based on: Your symptoms and medical history. A physical exam. A blood test to check for infection. A test of a pus sample. How is this treated? You may not need any treatment if your  cyst does not cause symptoms. If your cyst bothers you or is infected, you may need a procedure to drain or remove the cyst. Depending on the size, location, and severity of your cyst, your health care provider may: Make an incision in the cyst and drain it (incision anddrainage). Open and drain the cyst, and then stitch the wound so that it stays open while it heals (marsupialization). You will be given instructions about how to care for your open wound while it heals. Remove all or part of the cyst, and then close the wound (cyst removal). You may need to take antibiotics before your procedure. Follow these instructions at home: Medicines Take over-the-counter and prescription medicines only as told by your health care provider. If you were prescribed antibiotics, take them as told by your health care provider. Do not stop taking the antibiotic even if you start to feel better. General instructions Keep the area around the cyst clean and dry. If there is fluid or pus draining from your cyst: Cover the area with a clean bandage (dressing). Wash the area gently with soap and water. Pat the area dry with a clean towel. Do not rub the area because that may cause bleeding. Remove hair from the area around the cyst only if your health care provider tells you to. Do not wear tight pants or sit in one position for long periods at a time. Contact a health care provider if: You have new redness, swelling, or pain. You have a fever. You have severe pain. Summary A pilonidal cyst is a fluid-filled sac that forms under the skin near the tailbone, at the top of the crease of the buttocks (pilonidal area). Cysts that become irritated or infected may grow and fill with pus. An infected cyst is called an abscess. The cause of this condition is not always known. In some cases, it may be caused by a hair that grows into your skin (ingrown hair). You may not need any treatment if your cyst does not cause  symptoms. If your cyst bothers you or is infected, you may need a procedure to drain or remove the cyst. This information is not intended to replace advice given to you by your health care provider. Make sure you discuss any questions you have with your health care provider. Document Revised: 07/18/2021 Document Reviewed: 07/18/2021 Elsevier Patient Education  [place].

## 2022-04-05 ENCOUNTER — Telehealth: Payer: Self-pay | Admitting: Surgery

## 2022-04-05 NOTE — Telephone Encounter (Signed)
Patient has been advised of Pre-Admission date/time, and Surgery date at [facility].  Surgery Date: 04/22/22 Preadmission Testing Date: 04/12/22 (phone 8a-1p)  Patient has been made aware to call [phone], between 1-3:00pm the day before surgery, to find out what time to arrive for surgery.

## 2022-04-06 ENCOUNTER — Encounter: Payer: Self-pay | Admitting: Surgery

## 2022-04-09 ENCOUNTER — Ambulatory Visit: Payer: Medicare Other | Attending: Cardiology

## 2022-04-09 DIAGNOSIS — I5189 Other ill-defined heart diseases: Secondary | ICD-10-CM

## 2022-04-09 DIAGNOSIS — R072 Precordial pain: Secondary | ICD-10-CM | POA: Diagnosis not present

## 2022-04-09 HISTORY — DX: Other ill-defined heart diseases: I51.89

## 2022-04-09 LAB — ECHOCARDIOGRAM COMPLETE
AR max vel: 2.89 cm2
AV Area VTI: 2.98 cm2
AV Area mean vel: 2.87 cm2
AV Mean grad: 5 mmHg
AV Peak grad: 8.9 mmHg
Ao pk vel: 1.49 m/s
Area-P 1/2: 3.68 cm2
Calc EF: 59.6 %
S' Lateral: 3 cm
Single Plane A2C EF: 58.1 %
Single Plane A4C EF: 59.2 %

## 2022-04-11 ENCOUNTER — Ambulatory Visit: Payer: Self-pay | Admitting: Surgery

## 2022-04-11 DIAGNOSIS — L0591 Pilonidal cyst without abscess: Secondary | ICD-10-CM

## 2022-04-12 ENCOUNTER — Telehealth: Payer: Self-pay | Admitting: *Deleted

## 2022-04-12 ENCOUNTER — Encounter
Admission: RE | Admit: 2022-04-12 | Discharge: 2022-04-12 | Disposition: A | Discharge status: Home / Self Care | Payer: Medicare Other | Source: Ambulatory Visit | Attending: Surgery | Admitting: Surgery

## 2022-04-12 VITALS — Ht 67.0 in | Wt 208.0 lb

## 2022-04-12 DIAGNOSIS — E1129 Type 2 diabetes mellitus with other diabetic kidney complication: Secondary | ICD-10-CM

## 2022-04-12 NOTE — Telephone Encounter (Signed)
-----   Message from Karen Kitchens, NP sent at 04/12/2022  2:31 PM EST ----- Regarding: Request for pre-operative cardiac clearance Request for pre-operative cardiac clearance:  1. What type of surgery is being performed?  CYST EXCISION PILONIDAL EXTENSIVE  2. When is this surgery scheduled?  04/22/2022  3. Type of clearance being requested (medical, pharmacy, both)? MEDICAL   4. Are there any medications that need to be held prior to surgery? ASA  5. Practice name and name of physician performing surgery?  Performing surgeon: Dr. Ronny Bacon, MD Requesting clearance: Honor Loh, FNP-C    6. Anesthesia type (none, local, MAC, general)? GENERAL  7. What is the office phone and fax number?   Phone: [phone] Fax: [phone]  ATTENTION: Unable to create telephone message as per your standard workflow. Directed by [facility] providers to send requests for cardiac clearance to this pool for appropriate distribution to provider covering pre-operative clearances.   Honor Loh, MSN, APRN, FNP-C, CEN [facility]  Peri-operative Services Nurse Practitioner Phone: [phone] 04/12/22 2:31 PM

## 2022-04-12 NOTE — Telephone Encounter (Signed)
   Name: David [name].  DOB: 08-09-1945  MRN: [id number]  Primary Cardiologist: Kate Sable, MD  Chart reviewed as part of pre-operative protocol coverage. The patient has an upcoming visit scheduled with Dr. Garen Lah on 04/15/2022 at which time clearance can be addressed in case there are any issues that would impact surgical recommendations.  Cyst excision pilonidal extensive is not scheduled until 04/22/2022 as below. I added preop FYI to appointment note so that provider is aware to address at time of outpatient visit.  Per office protocol the cardiology provider should forward their finalized clearance decision and recommendations regarding antiplatelet therapy to the requesting party below.    I will route this message as FYI to requesting party and remove this message from the preop box as separate preop APP input not needed at this time.   Please call with any questions.  Lenna Sciara, NP  04/12/2022, 3:20 PM

## 2022-04-12 NOTE — Patient Instructions (Addendum)
Your procedure is scheduled on: Monday April 22, 2022. Report to Day Surgery inside [facility] 2nd floor, stop by registration desk before getting on elevator.  To find out your arrival time please call [phone] between 1PM - 3PM on Friday April 19, 2022.  Remember: Instructions that are not followed completely may result in serious medical risk,  up to and including death, or upon the discretion of your surgeon and anesthesiologist your  surgery may need to be rescheduled.     _X__ 1. Do not eat food or drink fluids after midnight the night before your procedure.                 No chewing gum or hard candies.   __X__2.  On the morning of surgery brush your teeth with toothpaste and water, you                may rinse your mouth with mouthwash if you wish.  Do not swallow any toothpaste or mouthwash.     _X__ 3.  No Alcohol for 24 hours before or after surgery.   _X__ 4.  Do Not Smoke or use e-cigarettes For 24 Hours Prior to Your Surgery.                 Do not use any chewable tobacco products for at least 6 hours prior to                 Surgery.  _X__  5.  Do not use any recreational drugs (marijuana, cocaine, heroin, ecstasy, MDMA or other)                For at least one week prior to your surgery.  Combination of these drugs with anesthesia                May have life threatening results.  ____  6.  Bring all medications with you on the day of surgery if instructed.   __X__  7.  Notify your doctor if there is any change in your medical condition      (cold, fever, infections).     Do not wear jewelry, make-up, hairpins, clips or nail polish. Do not wear lotions, powders, or perfumes. You may wear deodorant. Do not shave 48 hours prior to surgery. Men may shave face and neck. Do not bring valuables to the hospital.    [facility] is not responsible for any belongings or valuables.  Contacts, dentures or bridgework may not be worn into  surgery. Leave your suitcase in the car. After surgery it may be brought to your room. For patients admitted to the hospital, discharge time is determined by your treatment team.   Patients discharged the day of surgery will not be allowed to drive home.   Make arrangements for someone to be with you for the first 24 hours of your Same Day Discharge.   __X__ Take these medicines the morning of surgery with A SIP OF WATER:    1. None  2.   3.   4.  5.  6.  ____ Fleet Enema (as directed)   __X__ Use CHG Soap (or wipes) as directed  ____ Use Benzoyl Peroxide Gel as instructed  ____ Use inhalers on the day of surgery  __X__ Stop metFORMIN (GLUCOPHAGE) 500 MG 2 days prior to surgery (take last dose Friday April 19, 2022).    ____ Take 1/2 of usual insulin dose the night before surgery.  No insulin the morning          of surgery.   __X__ Ask your cardiologist on Monday's appointment about your aspirin 81 mg and ask when to stop before your surgery.   __X__ One Week prior to surgery- Stop Anti-inflammatories such as Ibuprofen, Aleve, Advil, Motrin, meloxicam (MOBIC), diclofenac, etodolac, ketorolac, Toradol, Daypro, piroxicam, Goody's or BC powders. OK TO USE TYLENOL IF NEEDED   __X__ Stop supplements until after surgery.    ____ Bring C-Pap to the hospital.    If you have any questions regarding your pre-procedure instructions,  Please call Pre-admit Testing at [phone]     Preparing for Surgery with CHLORHEXIDINE GLUCONATE (CHG) Soap  Chlorhexidine Gluconate (CHG) Soap  o An antiseptic cleaner that kills germs and bonds with the skin to continue killing germs even after washing  o Used for showering the night before surgery and morning of surgery  Before surgery, you can play an important role by reducing the number of germs on your skin.  CHG (Chlorhexidine gluconate) soap is an antiseptic cleanser which kills germs and bonds with the skin to continue  killing germs even after washing.  Please do not use if you have an allergy to CHG or antibacterial soaps. If your skin becomes reddened/irritated stop using the CHG.  1. Shower the NIGHT BEFORE SURGERY and the MORNING OF SURGERY with CHG soap.  2. If you choose to wash your hair, wash your hair first as usual with your normal shampoo.  3. After shampooing, rinse your hair and body thoroughly to remove the shampoo.  4. Use CHG as you would any other liquid soap. You can apply CHG directly to the skin and wash gently with a scrungie or a clean washcloth.  5. Apply the CHG soap to your body only from the neck down. Do not use on open wounds or open sores. Avoid contact with your eyes, ears, mouth, and genitals (private parts). Wash face and genitals (private parts) with your normal soap.  6. Wash thoroughly, paying special attention to the area where your surgery will be performed.  7. Thoroughly rinse your body with warm water.  8. Do not shower/wash with your normal soap after using and rinsing off the CHG soap.  9. Pat yourself dry with a clean towel.  10. Wear clean pajamas to bed the night before surgery.  12. Place clean sheets on your bed the night of your first shower and do not sleep with pets.  13. Shower again with the CHG soap on the day of surgery prior to arriving at the hospital.  14. Do not apply any deodorants/lotions/powders.  15. Please wear clean clothes to the hospital.

## 2022-04-15 ENCOUNTER — Encounter
Admission: RE | Admit: 2022-04-15 | Discharge: 2022-04-15 | Disposition: A | Discharge status: Home / Self Care | Payer: Medicare Other | Source: Ambulatory Visit | Attending: Surgery | Admitting: Surgery

## 2022-04-15 ENCOUNTER — Ambulatory Visit: Payer: Medicare Other | Attending: Cardiology | Admitting: Cardiology

## 2022-04-15 ENCOUNTER — Encounter: Payer: Self-pay | Admitting: Cardiology

## 2022-04-15 VITALS — BP 130/68 | HR 66 | Ht 67.0 in | Wt 207.6 lb

## 2022-04-15 DIAGNOSIS — Z01812 Encounter for preprocedural laboratory examination: Secondary | ICD-10-CM | POA: Insufficient documentation

## 2022-04-15 DIAGNOSIS — F17200 Nicotine dependence, unspecified, uncomplicated: Secondary | ICD-10-CM | POA: Diagnosis not present

## 2022-04-15 DIAGNOSIS — I2510 Atherosclerotic heart disease of native coronary artery without angina pectoris: Secondary | ICD-10-CM

## 2022-04-15 DIAGNOSIS — L0591 Pilonidal cyst without abscess: Secondary | ICD-10-CM

## 2022-04-15 DIAGNOSIS — E7800 Pure hypercholesterolemia, unspecified: Secondary | ICD-10-CM | POA: Diagnosis not present

## 2022-04-15 LAB — CBC WITH DIFFERENTIAL/PLATELET
Abs Immature Granulocytes: 0.06 10*3/uL (ref 0.00–0.07)
Basophils Absolute: 0.1 10*3/uL (ref 0.0–0.1)
Basophils Relative: 1 %
Eosinophils Absolute: 0.2 10*3/uL (ref 0.0–0.5)
Eosinophils Relative: 3 %
HCT: 42.1 % (ref 39.0–52.0)
Hemoglobin: 14.8 g/dL (ref 13.0–17.0)
Immature Granulocytes: 1 %
Lymphocytes Relative: 28 %
Lymphs Abs: 2.6 10*3/uL (ref 0.7–4.0)
MCH: 32.6 pg (ref 26.0–34.0)
MCHC: 35.2 g/dL (ref 30.0–36.0)
MCV: 92.7 fL (ref 80.0–100.0)
Monocytes Absolute: 0.8 10*3/uL (ref 0.1–1.0)
Monocytes Relative: 8 %
Neutro Abs: 5.5 10*3/uL (ref 1.7–7.7)
Neutrophils Relative %: 59 %
Platelets: 218 10*3/uL (ref 150–400)
RBC: 4.54 MIL/uL (ref 4.22–5.81)
RDW: 12 % (ref 11.5–15.5)
WBC: 9.2 10*3/uL (ref 4.0–10.5)
nRBC: 0 % (ref 0.0–0.2)

## 2022-04-15 LAB — COMPREHENSIVE METABOLIC PANEL
ALT: 22 U/L (ref 0–44)
AST: 20 U/L (ref 15–41)
Albumin: 3.8 g/dL (ref 3.5–5.0)
Alkaline Phosphatase: 88 U/L (ref 38–126)
Anion gap: 6 (ref 5–15)
BUN: 17 mg/dL (ref 8–23)
CO2: 27 mmol/L (ref 22–32)
Calcium: 9.2 mg/dL (ref 8.9–10.3)
Chloride: 105 mmol/L (ref 98–111)
Creatinine, Ser: 0.81 mg/dL (ref 0.61–1.24)
GFR, Estimated: >60 mL/min (ref >60)
Glucose, Bld: 171 mg/dL — ABNORMAL HIGH (ref 70–99)
Potassium: 3.9 mmol/L (ref 3.5–5.1)
Sodium: 138 mmol/L (ref 135–145)
Total Bilirubin: 0.8 mg/dL (ref 0.3–1.2)
Total Protein: 6.6 g/dL (ref 6.5–8.1)

## 2022-04-15 MED ORDER — NITROGLYCERIN 0.4 MG SL SUBL: 0.4000 mg | SUBLINGUAL_TABLET | SUBLINGUAL | 3 refills | Status: DC | PRN

## 2022-04-15 NOTE — Patient Instructions (Addendum)
Medication Instructions:   START Nitroglycerin - Place 1 nitroglycerin under your tongue, while sitting. If no relief of pain, may repeat 1 tablet every 5 minutes up to 3 tablets over 15 minutes. If no relief call 911. If you have dizziness/lightheadedness while taking nitroglycerin, stop taking and call 911.    *If you need a refill on your cardiac medications before your next appointment, please call your pharmacy*   Lab Work:  None Ordered  If you have labs (blood work) drawn today and your tests are completely normal, you will receive your results only by: [place] (if you have MyChart) OR A paper copy in the mail If you have any lab test that is abnormal or we need to change your treatment, we will call you to review the results.   Testing/Procedures:  None Ordered   Follow-Up: At [facility], you and your health needs are our priority.  As part of our continuing mission to provide you with exceptional heart care, we have created designated Provider Care Teams.  These Care Teams include your primary Cardiologist (physician) and Advanced Practice Providers (APPs -  Physician Assistants and Nurse Practitioners) who all work together to provide you with the care you need, when you need it.  We recommend signing up for the patient portal called "MyChart".  Sign up information is provided on this After Visit Summary.  MyChart is used to connect with patients for Virtual Visits (Telemedicine).  Patients are able to view lab/test results, encounter notes, upcoming appointments, etc.  Non-urgent messages can be sent to your provider as well.   To learn more about what you can do with MyChart, go to [email or web address].    Your next appointment:   12 month(s)  The format for your next appointment:   In Person  Provider:   You may see Kate Sable, MD or one of the following Advanced Practice Providers on your designated Care Team:   Murray Hodgkins,  NP Christell Faith, PA-C Cadence Kathlen Mody, PA-C Gerrie Nordmann, NP

## 2022-04-15 NOTE — Progress Notes (Signed)
Cardiology Office Note:    Date:  04/15/2022   ID:  David Mutter., DOB Jun 29, 1945, MRN [id number]  PCP:  Virginia Crews, MD   [facility] Prov[facility] Cardiologist:  Kate Sable, MD     Referring MD: Virginia Crews, MD   Chief Complaint  Patient presents with   Follow-up    Testing follow up, no new cardiac concerns     History of Present Illness:    David Petron. is a 77 y.o. male with a hx of hyperlipidemia, diabetes, current smoker x50+ years who presents for follow-up.  Last seen due to chest pain.  Echo and coronary CTA obtained to evaluate presence of CAD and cardiac function.  Patient still smokes.  No new cardiac concerns at this time, presents for follow-up results.  Still has occasional chest pain not associated with exertion.   Past Medical History:  Diagnosis Date   Actinic keratosis 01/13/2017   R upper back post base of neck - biopsy proven    Allergy    COPD (chronic obstructive pulmonary disease) ([place])    misdiagnosed   Depression    Diabetes mellitus without complication ([place])    Dysplastic nevus 12/23/2007   R med mid post thigh - slight atypia   Dysplastic nevus 12/22/2008   L mid ant thigh - slight atypia   Dysplastic nevus 05/10/2020   RUQA lat, severia atypia, excised 07/11/20   Hyperlipidemia    Hypertension    Melanoma ([place]) 1988   MM - clavicle, txted at [facility]   Microalbuminuria    Sleep apnea    Squamous cell carcinoma of skin 07/18/2020   nasal tip - Aypical Squamous Proliferation with Desmoplasia. Atypical squamous nests are concerning for invasive SCC. [facility] 09/11/2020    Past Surgical History:  Procedure Laterality Date   broken right arm     in seventh grade   CHOLECYSTECTOMY  2001   COLONOSCOPY WITH PROPOFOL N/A 05/08/2018   Procedure: COLONOSCOPY WITH PROPOFOL;  Surgeon: Manya Silvas, MD;  Location: [facility] ENDOSCOPY;  Service: Endoscopy;  Laterality: N/A;   COLONOSCOPY WITH PROPOFOL N/A  10/30/2020   Procedure: COLONOSCOPY WITH PROPOFOL;  Surgeon: Toledo, Benay Pike, MD;  Location: [facility] ENDOSCOPY;  Service: Gastroenterology;  Laterality: N/A;   MELANOMA EXCISION  1998   TONSILLECTOMY AND ADENOIDECTOMY  1953    Current Medications: Current Meds  Medication Sig   aspirin EC 81 MG tablet Take 1 tablet (81 mg total) by mouth daily. Swallow whole.   atorvastatin (LIPITOR) 40 MG tablet Take 1 tablet by mouth daily.   fluticasone (FLONASE) 50 MCG/ACT nasal spray Place 2 sprays into both nostrils daily.   lisinopril (ZESTRIL) 5 MG tablet Take 1 tablet by mouth daily.   loratadine (CLARITIN) 10 MG tablet Take 1 tablet (10 mg total) by mouth daily.   meloxicam (MOBIC) 15 MG tablet Take 1 tablet (15 mg total) by mouth as needed. (Patient taking differently: Take 15 mg by mouth as needed. Rarely needs)   metFORMIN (GLUCOPHAGE) 500 MG tablet TAKE 1 TABLET(500 MG) BY MOUTH AT BEDTIME   Multiple Vitamin (MULTIVITAMIN ADULT PO) Take by mouth.   nitroGLYCERIN (NITROSTAT) 0.4 MG SL tablet Place 1 tablet (0.4 mg total) under the tongue every 5 (five) minutes as needed for chest pain. You can repeat 3 time - after the 3rd time you will need to call 911   tamsulosin (FLOMAX) 0.4 MG CAPS capsule TAKE 1 CAPSULE(0.4 MG) BY MOUTH DAILY  Allergies:   Alcohol   Social History   Socioeconomic History   Marital status: Married    Spouse name: Not on file   Number of children: 2   Years of education: College   Highest education level: Bachelor's degree (e.g., BA, AB, BS)  Occupational History    Employer: [employer]    Comment: retired  Tobacco Use   Smoking status: Every Day    Packs/day: 1.00    Years: 52.00    Total pack years: 52.00    Types: Cigarettes   Smokeless tobacco: Never   Tobacco comments:    Pt started smoking again. Is smoking 1.25-1.5 PPD  Vaping Use   Vaping Use: Never used  Substance and Sexual Activity   Alcohol use: Not Currently    Comment: Previous Heavy  Alcohol Abuse, sober   Drug use: Not Currently   Sexual activity: Not on file  Other Topics Concern   Not on file  Social History Narrative   Not on file   Social Determinants of Health   Financial Resource Strain: Low Risk  (05/15/2021)   Overall Financial Resource Strain (CARDIA)    Difficulty of Paying Living Expenses: Not hard at all  Food Insecurity: No Food Insecurity (05/15/2021)   Hunger Vital Sign    Worried About Running Out of Food in the Last Year: Never true    Ran Out of Food in the Last Year: Never true  Transportation Needs: No Transportation Needs (05/15/2021)   PRAPARE - Hydrologist (Medical): No    Lack of Transportation (Non-Medical): No  Physical Activity: Inactive (05/15/2021)   Exercise Vital Sign    Days of Exercise per Week: 0 days    Minutes of Exercise per Session: 0 min  Stress: No Stress Concern Present (05/15/2021)   [place]    Feeling of Stress : Not at all  Social Connections: [place] (05/15/2021)   Social Connection and Isolation Panel [NHANES]    Frequency of Communication with Friends and Family: More than three times a week    Frequency of Social Gatherings with Friends and Family: More than three times a week    Attends Religious Services: More than 4 times per year    Active Member of [employer] or Organizations: Yes    Attends Music therapist: More than 4 times per year    Marital Status: Married     Family History: The patient's family history includes Cervical cancer in his maternal aunt, maternal grandmother, and mother; Colon cancer in his mother; Colon polyps in his brother; Diabetes in his maternal grandmother; Lung cancer in his maternal grandfather; Tuberculosis in his maternal grandmother.  ROS:   Please see the history of present illness.     All other systems reviewed and are negative.  EKGs/Labs/Other Studies  Reviewed:    The following studies were reviewed today:   EKG:  EKG not ordered today.    Recent Labs: 12/13/2021: ALT 27; Hemoglobin 16.2; Platelets 221; TSH 2.020 02/11/2022: BUN 13; Creatinine, Ser 0.87; Potassium 4.2; Sodium 138  Recent Lipid Panel    Component Value Date/Time   CHOL 122 12/13/2021 1526   TRIG 217 (H) 12/13/2021 1526   HDL 31 (L) 12/13/2021 1526   CHOLHDL 3.9 12/13/2021 1526   LDLCALC 56 12/13/2021 1526     Risk Assessment/Calculations:             Physical Exam:  VS:  BP 130/68 (BP Location: Left Arm, Patient Position: Sitting, Cuff Size: Normal)   Pulse 66   Ht '5\' 7"'$  (1.702 m)   Wt 207 lb 9.6 oz (94.2 kg)   SpO2 98%   BMI 32.51 kg/m     Wt Readings from Last 3 Encounters:  04/15/22 207 lb 9.6 oz (94.2 kg)  04/12/22 208 lb (94.3 kg)  04/04/22 206 lb (93.4 kg)     GEN:  Well nourished, well developed in no acute distress HEENT: Normal NECK: No JVD; No carotid bruits CARDIAC: RRR, no murmurs, rubs, gallops RESPIRATORY:  Clear to auscultation without rales, wheezing or rhonchi  ABDOMEN: Soft, non-tender, non-distended MUSCULOSKELETAL:  No edema; No deformity  SKIN: Warm and dry NEUROLOGIC:  Alert and oriented x 3 PSYCHIATRIC:  Normal affect   ASSESSMENT:    1. Coronary artery disease involving native coronary artery of native heart, unspecified whether angina present   2. Pure hypercholesterolemia   3. Smoking    PLAN:    In order of problems listed above:  Nonobstructive CAD, coronary CTA with mild proximal LAD and RCA disease.  Echo with normal EF 60 to 65%.  Continue aspirin 81 mg daily, Lipitor 40 mg.  Okay for sublingual nitro for occasional chest pain.  Did not want Imdur. Hyperlipidemia, continue Lipitor 40 mg daily. Current smoker, smoking cessation advised.  Follow-up in 1 year.      Medication Adjustments/Labs and Tests Ordered: Current medicines are reviewed at length with the patient today.  Concerns regarding  medicines are outlined above.  No orders of the defined types were placed in this encounter.  Meds ordered this encounter  Medications   nitroGLYCERIN (NITROSTAT) 0.4 MG SL tablet    Sig: Place 1 tablet (0.4 mg total) under the tongue every 5 (five) minutes as needed for chest pain. You can repeat 3 time - after the 3rd time you will need to call 911    Dispense:  90 tablet    Refill:  3    Patient Instructions  Medication Instructions:   START Nitroglycerin - Place 1 nitroglycerin under your tongue, while sitting. If no relief of pain, may repeat 1 tablet every 5 minutes up to 3 tablets over 15 minutes. If no relief call 911. If you have dizziness/lightheadedness while taking nitroglycerin, stop taking and call 911.    *If you need a refill on your cardiac medications before your next appointment, please call your pharmacy*   Lab Work:  None Ordered  If you have labs (blood work) drawn today and your tests are completely normal, you will receive your results only by: [place] (if you have MyChart) OR A paper copy in the mail If you have any lab test that is abnormal or we need to change your treatment, we will call you to review the results.   Testing/Procedures:  None Ordered   Follow-Up: At [facility], you and your health needs are our priority.  As part of our continuing mission to provide you with exceptional heart care, we have created designated Provider Care Teams.  These Care Teams include your primary Cardiologist (physician) and Advanced Practice Providers (APPs -  Physician Assistants and Nurse Practitioners) who all work together to provide you with the care you need, when you need it.  We recommend signing up for the patient portal called "MyChart".  Sign up information is provided on this After Visit Summary.  MyChart is used to connect with patients for Virtual  Visits (Telemedicine).  Patients are able to view lab/test results, encounter notes,  upcoming appointments, etc.  Non-urgent messages can be sent to your provider as well.   To learn more about what you can do with MyChart, go to [email or web address].    Your next appointment:   12 month(s)  The format for your next appointment:   In Person  Provider:   You may see Kate Sable, MD or one of the following Advanced Practice Providers on your designated Care Team:   Murray Hodgkins, NP Christell Faith, PA-C Cadence Kathlen Mody, PA-C Gerrie Nordmann, NP        Signed, Kate Sable, MD  04/15/2022 2:24 PM    [place]

## 2022-04-16 ENCOUNTER — Encounter: Payer: Self-pay | Admitting: Surgery

## 2022-04-16 ENCOUNTER — Telehealth: Payer: Self-pay | Admitting: Surgery

## 2022-04-16 NOTE — Telephone Encounter (Signed)
Patient will be having pilonidal cyst surgery on 04/22/22 with Dr. Christian Mate.  Patient lives alone, his wife is in assisted living.  He is concerned about having to do his daily wound changes after surgery.     We have gone through the process with [facility], records were faxed.  Patient has [facility] and has come back to where the patient will be responsible for 20% which will be 375.00 per month.  Patient can't do this, so has decided against the wound vac.  Patient also will be looking into his insurance for home health to possibly help out.    Another alternative, patient will have a 90 day global period, and we have offered for to him to come in daily Monday through Friday as needed for nurse visit so that we can help with his wound changes.  Patient more comfortable with this option and we will arrange accordingly for him after surgery.

## 2022-04-16 NOTE — Progress Notes (Signed)
Clearance was not addressed during visit with provider on 04/15/2022. With ongoing chest pain, and addition of short acting nitrate, would still like to have cardiology weigh on whether or not patient is felt to be appropriate for general anesthesia for non-cardiac procedure. Recent cCTA and TTE result in [facility] for review by cardiology team.   Honor Loh, MSN, APRN, FNP-C, CEN [place] Nurse Practitioner 04/16/22 8:42 AM

## 2022-04-16 NOTE — Progress Notes (Signed)
Perioperative Services  Pre-Admission/Anesthesia Testing Clinical Review  Date: 04/17/22  Patient Demographics:  Name: David [name]. DOB:   Jun 23, 1945 MRN:   [id number]  Planned Surgical Procedure(s):    Case: [id number] Date/Time: 04/22/22 1123   Procedure: CYST EXCISION PILONIDAL EXTENSIVE   Anesthesia type: General   Pre-op diagnosis: pilonidal cyst   Location: [facility] OR ROOM 08 / [place] [facility]   Surgeons: Ronny Bacon, MD   NOTE: Available PAT nursing documentation and vital signs have been reviewed. Clinical nursing staff has updated patient's PMH/PSHx, current medication list, and drug allergies/intolerances to ensure comprehensive history available to assist in medical decision making as it pertains to the aforementioned surgical procedure and anticipated anesthetic course. Extensive review of available clinical information performed. [place] PMH and PSHx updated with any diagnoses/procedures that  may have been inadvertently omitted during his intake with the pre-admission testing department's nursing staff.  Clinical Discussion:  David Melody. is a 76 y.o. male who is submitted for pre-surgical anesthesia review and clearance prior to him undergoing the above procedure. Patient is a Current Smoker (52 pack years). Pertinent PMH includes: CAD, atypical chest pain, diastolic dysfunction, aortic atherosclerosis, HTN, HLD, T2DM, COPD, OSAH (requires nocturnal PAP therapy), BPH, pilonidal disease.  Patient is followed by cardiology Garen Lah, MD, MD). He was last seen in the cardiology clinic on 04/15/2022; notes reviewed.  At the time of his clinic visit, patient continued to experience episodes of nonexertional chest pain.  He had chronic episodes of intermittent dyspnea related to his underlying COPD diagnosis and ongoing smoking history.  He denied any PND, orthopnea, palpitations, significant peripheral edema, vertiginous symptoms, or  presyncope/syncope.  Patient with a past medical history significant for cardiovascular diagnoses.  Coronary CTA performed on 02/18/2022 revealed a coronary calcium score of 159, which was in the 38th percentile for age and sex matched control.  Coronary anatomy showed a normal coronary origin with RIGHT-sided dominance.  There was mild (25-49%) disease noted within the proximal LAD and RCA territories.  There were no significant extracardiac findings.  Most recent TTE was performed on 04/09/2022 revealing a normal left ventricular systolic function with mild LVH; LVEF 60-65%. Left ventricular diastolic Doppler parameters consistent with abnormal relaxation (G1DD).  There was mild aortic valve sclerosis/calcification present.  No significant valvular regurgitation noted.  All transvalvular gradients were noted to be normal with no evidence suggestive of valvular stenosis.  Blood pressure reasonably controlled at 130/68 mmHg on currently prescribed ACEi (lisinopril) monotherapy.  Patient is currently taking atorvastatin for his HLD diagnosis and further ASCVD prevention.  T2DM well-controlled on currently prescribed regimen; last HgbA1c was 6.6% when checked on 12/13/2021.  Patient does have an OSAH diagnosis and is reportedly compliant with prescribed nocturnal PAP therapy. Functional capacity, as defined by DASI, is documented as being >/= 4 METS.  Given patient's ongoing chest pain at rest, prescription for short acting nitrates (NTG) was provided.  No other changes were made to his medication regimen.  Patient to follow-up with outpatient cardiology and 1 year or sooner if needed.  [employer]. is scheduled for an elective EXTENSIVE PILONIDAL CYST EXCISION on 04/22/2022 with Dr. Ronny Bacon, MD.  Given patient's past medical history significant for cardiovascular diagnoses, presurgical cardiac clearance was sought by the PAT team. Per cardiology, "based ACC/AHA guidelines, the patient's past  medical history, and the amount of time since his last clinic visit, this patient would be at an overall ACCEPTABLE risk for  the planned procedure without further cardiovascular testing or intervention at this time".  In review of his medication reconciliation, it is noted that patient is currently on prescribed daily antiplatelet therapy. Patient to continue his daily low dose ASA throughout his perioperative course.   Patient denies previous perioperative complications with anesthesia in the past. In review of the available records, it is noted that patient underwent a general anesthetic course here at [facility] (ASA III) in 10/2020 without documented complications.      04/15/2022    1:39 PM 04/12/2022   12:50 PM 04/04/2022   11:09 AM  Vitals with BMI  Height '5\' 7"'$  '5\' 7"'$  '5\' 7"'$   Weight 207 lbs 10 oz 208 lbs 206 lbs  BMI 32.51 97.98 92.11  Systolic 941  740  Diastolic 68  74  Pulse 66  80    Providers/Specialists:   NOTE: Primary physician provider listed below. Patient may have been seen by APP or partner within same practice.   PROVIDER ROLE / SPECIALTY LAST Katy Apo, MD General Surgery (Surgeon) 04/04/2022  Virginia Crews, MD Primary Care Provider 04/02/2022  Kate Sable, MD Cardiology 04/15/2022   Allergies:  Alcohol  Current Home Medications:   No current facility-administered medications for this encounter.    aspirin EC 81 MG tablet   atorvastatin (LIPITOR) 40 MG tablet   fluticasone (FLONASE) 50 MCG/ACT nasal spray   lisinopril (ZESTRIL) 5 MG tablet   loratadine (CLARITIN) 10 MG tablet   meloxicam (MOBIC) 15 MG tablet   metFORMIN (GLUCOPHAGE) 500 MG tablet   Multiple Vitamin (MULTIVITAMIN ADULT PO)   nitroGLYCERIN (NITROSTAT) 0.4 MG SL tablet   tamsulosin (FLOMAX) 0.4 MG CAPS capsule   History:   Past Medical History:  Diagnosis Date   Actinic keratosis 01/13/2017   R upper back post base of neck -  biopsy proven    Adenomatous colon polyp    Allergy    Aortic atherosclerosis (HCC)    Atypical chest pain    BPH (benign prostatic hyperplasia)    CAD (coronary artery disease)    a.) CT chest 07/26/2021: 2v CAD; b.) cCTA 02/18/2022: CA score 159 (38th percentile for age/sex match); 25-49% pLAD and RCA distribution.   COPD (chronic obstructive pulmonary disease) ([place])    Depression    Diastolic dysfunction 81/44/8185   a.) TTE 04/09/2022: EF 60-65%, mild LVH, AoV sclerosis, G1DD   Dysplastic nevus 12/23/2007   R med mid post thigh - slight atypia   Dysplastic nevus 12/22/2008   L mid ant thigh - slight atypia   Dysplastic nevus 05/10/2020   RUQA lat, severia atypia, excised 07/11/20   Hyperlipidemia    Hypertension    Melanoma ([place]) 1988   MM - clavicle, txted at [facility]   Microalbuminuria    OSA on CPAP    Pilonidal disease    Squamous cell carcinoma of skin 07/18/2020   nasal tip - Aypical Squamous Proliferation with Desmoplasia. Atypical squamous nests are concerning for invasive SCC. MOHs 09/11/2020   T2DM (type 2 diabetes mellitus) [facility])    Past Surgical History:  Procedure Laterality Date   broken right arm     in seventh grade   CHOLECYSTECTOMY  2001   COLONOSCOPY WITH PROPOFOL N/A 05/08/2018   Procedure: COLONOSCOPY WITH PROPOFOL;  Surgeon: Manya Silvas, MD;  Location[facility] ENDOSCOPY;  Service: Endoscopy;  Laterality: N/A;   COLONOSCOPY WITH PROPOFOL N/A 10/30/2020   Procedure: COLONOSCOPY WITH PROPOFOL;  Surgeon: Toledo, Benay Pike, MD;  Location: [facility] ENDOSCOPY;  Service: Gastroenterology;  Laterality: N/A;   MELANOMA EXCISION  1998   TONSILLECTOMY AND ADENOIDECTOMY  1953   Family History  Problem Relation Age of Onset   Cervical cancer Mother    Colon cancer Mother    Colon polyps Brother    Cervical cancer Maternal Grandmother    Diabetes Maternal Grandmother    Tuberculosis Maternal Grandmother    Lung cancer Maternal Grandfather    Cervical cancer  Maternal Aunt    Social History   Tobacco Use   Smoking status: Every Day    Packs/day: 1.00    Years: 52.00    Total pack years: 52.00    Types: Cigarettes   Smokeless tobacco: Never   Tobacco comments:    Pt started smoking again. Is smoking 1.25-1.5 PPD  Vaping Use   Vaping Use: Never used  Substance Use Topics   Alcohol use: Not Currently    Comment: Previous Heavy Alcohol Abuse, sober   Drug use: Not Currently    Pertinent Clinical Results:  LABS: Labs reviewed: Acceptable for surgery.  Hospital Outpatient Visit on 04/15/2022  Component Date Value Ref Range Status   WBC 04/15/2022 9.2  4.0 - 10.5 K/uL Final   RBC 04/15/2022 4.54  4.22 - 5.81 MIL/uL Final   Hemoglobin 04/15/2022 14.8  13.0 - 17.0 g/dL Final   HCT 04/15/2022 42.1  39.0 - 52.0 % Final   MCV 04/15/2022 92.7  80.0 - 100.0 fL Final   MCH 04/15/2022 32.6  26.0 - 34.0 pg Final   MCHC 04/15/2022 35.2  30.0 - 36.0 g/dL Final   RDW 04/15/2022 12.0  11.5 - 15.5 % Final   Platelets 04/15/2022 218  150 - 400 K/uL Final   nRBC 04/15/2022 0.0  0.0 - 0.2 % Final   Neutrophils Relative % 04/15/2022 59  % Final   Neutro Abs 04/15/2022 5.5  1.7 - 7.7 K/uL Final   Lymphocytes Relative 04/15/2022 28  % Final   Lymphs Abs 04/15/2022 2.6  0.7 - 4.0 K/uL Final   Monocytes Relative 04/15/2022 8  % Final   Monocytes Absolute 04/15/2022 0.8  0.1 - 1.0 K/uL Final   Eosinophils Relative 04/15/2022 3  % Final   Eosinophils Absolute 04/15/2022 0.2  0.0 - 0.5 K/uL Final   Basophils Relative 04/15/2022 1  % Final   Basophils Absolute 04/15/2022 0.1  0.0 - 0.1 K/uL Final   Immature Granulocytes 04/15/2022 1  % Final   Abs Immature Granulocytes 04/15/2022 0.06  0.00 - 0.07 K/uL Final   Performed at [facility], [place], [place] [address]   Sodium 04/15/2022 138  135 - 145 mmol/L Final   Potassium 04/15/2022 3.9  3.5 - 5.1 mmol/L Final   Chloride 04/15/2022 105  98 - 111 mmol/L Final   CO2 04/15/2022 27   22 - 32 mmol/L Final   Glucose, Bld 04/15/2022 171 (H)  70 - 99 mg/dL Final   Glucose reference range applies only to samples taken after fasting for at least 8 hours.   BUN 04/15/2022 17  8 - 23 mg/dL Final   Creatinine, Ser 04/15/2022 0.81  0.61 - 1.24 mg/dL Final   Calcium 04/15/2022 9.2  8.9 - 10.3 mg/dL Final   Total Protein 04/15/2022 6.6  6.5 - 8.1 g/dL Final   Albumin 04/15/2022 3.8  3.5 - 5.0 g/dL Final   AST 04/15/2022 20  15 - 41 U/L  Final   ALT 04/15/2022 22  0 - 44 U/L Final   Alkaline Phosphatase 04/15/2022 88  38 - 126 U/L Final   Total Bilirubin 04/15/2022 0.8  0.3 - 1.2 mg/dL Final   GFR, Estimated 04/15/2022 >60  >60 mL/min Final   Comment: (NOTE) Calculated using the CKD-EPI Creatinine Equation (2021)    Anion gap 04/15/2022 6  5 - 15 Final   Performed at [facility], [address] [place],  [address]    ECG: Date: 02/11/2022 Time ECG obtained: 1343 PM Rate: 62 bpm Rhythm: normal sinus Axis (leads I and aVF): Left axis deviation Intervals: PR 196 ms. QRS 100 ms. QTc 395 ms. ST segment and T wave changes: No evidence of acute ST segment elevation or depression Comparison: Similar to previous tracing obtained on 05/24/2015   IMAGING / PROCEDURES: TRANSTHORACIC ECHOCARDIOGRAM performed on 04/09/2022 Left ventricular ejection fraction, by estimation, is 60 to 65%. The left ventricle has normal function. The left ventricle has no regional wall motion abnormalities. There is mild left ventricular hypertrophy. Left ventricular diastolic parameters are consistent with Grade I diastolic dysfunction (impaired relaxation). Right ventricular systolic function is normal. The right ventricular size is normal. Tricuspid regurgitation signal is inadequate for assessing PA pressure. The mitral valve is normal in structure. No evidence of mitral valve regurgitation. No evidence of mitral stenosis. The aortic valve is normal in structure. Aortic valve  regurgitation is not visualized. Aortic valve sclerosis/calcification is present, without any evidence of aortic stenosis. The inferior vena cava is normal in size with greater than 50% respiratory variability, suggesting right atrial pressure of 3 mmHg.  CT CORONARY MORPH W/CTA COR W/SCORE W/CA W/CM &/OR WO/CM performed on 02/18/2022 No acute or significant extracardiac findings in the chest.  Coronary calcium score of 159. This was 38th percentile for age and sex matched control. Normal coronary origin with right dominance. Mild proximal LAD and RCA stenosis (25-49%). CAD-RADS 2. Mild non-obstructive CAD (25-49%). Consider non-atherosclerotic causes of chest pain. Consider preventive therapy and risk factor modification.  CT CHEST LUNG CA SCREEN LOW DOSE W/O CM performed on 07/26/2021 Lung-RADS 2, benign appearance or behavior. Continue annual screening with low-dose chest CT without contrast in 12 months Two-vessel coronary atherosclerosis Aortic atherosclerosis  Emphysema   Impression and Plan:  [name]. has been referred for pre-anesthesia review and clearance prior to him undergoing the planned anesthetic and procedural courses. Available labs, pertinent testing, and imaging results were personally reviewed by me. This patient has been appropriately cleared by cardiology with an overall ACCEPTABLE risk of significant perioperative cardiovascular complications.  Based on clinical review performed today (04/17/22), barring any significant acute changes in the patient's overall condition, it is anticipated that he will be able to proceed with the planned surgical intervention. Any acute changes in clinical condition may necessitate his procedure being postponed and/or cancelled. Patient will meet with anesthesia team (MD and/or CRNA) on the day of his procedure for preoperative evaluation/assessment. Questions regarding anesthetic course will be fielded at that time.   Pre-surgical  instructions were reviewed with the patient during his PAT appointment and questions were fielded by PAT clinical staff. Patient was advised that if any questions or concerns arise prior to his procedure then he should return a call to PAT and/or his surgeon's office to discuss.  Honor Loh, MSN, APRN, FNP-C, CEN [facility]  Peri-operative Services Nurse Practitioner Phone: [phone] Fax: [phone] 04/17/22 11:31 AM  NOTE: This  note has been prepared using [employer]. Despite my best ability to proofread, there is always the potential that unintentional transcriptional errors may still occur from this process.

## 2022-04-17 ENCOUNTER — Encounter: Payer: Self-pay | Admitting: Surgery

## 2022-04-17 NOTE — Telephone Encounter (Signed)
Per Dr. Garen Lah 04/15/22 "Nonobstructive CAD, coronary CTA with mild proximal LAD and RCA disease.  Echo with normal EF 60 to 65%.  Continue aspirin 81 mg daily, Lipitor 40 mg.  Okay for sublingual nitro for occasional chest pain.  Did not want Imdur."  He was recommended to follow up in one year.   Per AHA/ACC guidelines, he is deemed acceptable risk for the planned procedure without additional cardiovascular testing. Will route to surgical team so they are aware.    Loel Dubonnet, NP

## 2022-04-19 ENCOUNTER — Telehealth: Payer: Self-pay | Admitting: Surgery

## 2022-04-19 NOTE — Telephone Encounter (Signed)
Updated information regarding rescheduled surgery at patient's request.   Patient has been advised of Pre-Admission date/time, and Surgery date at [facility].  Surgery Date: 05/13/22 Preadmission Testing Date: 05/07/22 (phone 1p-5p)  Patient has been made aware to call [phone], between 1-3:00pm the day before surgery, to find out what time to arrive for surgery.    Also discussed with patient if he still wanted to proceed with wound vac with his new health insurance [employer] starting January 2024, at this time patient states would rather just not do the wound vac.

## 2022-05-02 DIAGNOSIS — H2513 Age-related nuclear cataract, bilateral: Secondary | ICD-10-CM | POA: Diagnosis not present

## 2022-05-02 LAB — HM DIABETES EYE EXAM

## 2022-05-07 ENCOUNTER — Encounter
Admission: RE | Admit: 2022-05-07 | Discharge: 2022-05-07 | Disposition: A | Discharge status: Home / Self Care | Payer: Medicare Other | Source: Ambulatory Visit | Attending: Surgery | Admitting: Surgery

## 2022-05-07 NOTE — Pre-Procedure Instructions (Signed)
Reviewed PAT instructions with patient, his surgery was rescheduled and will be done on 05/13/21, medications, Hx and allergies reviewed with no changes, verified that he has his instructions, and soap needed. He did voice that he will need to have the wound vac reordered for his surgery, I secured chat messaged Dennie Maizes RN and made her aware, also instructed him to give the office a call.

## 2022-05-12 MED ORDER — CHLORHEXIDINE GLUCONATE 0.12 % MT SOLN: 15.0000 mL | Freq: Once | OROMUCOSAL | Status: AC

## 2022-05-12 MED ORDER — ORAL CARE MOUTH RINSE: 15.0000 mL | Freq: Once | OROMUCOSAL | Status: AC

## 2022-05-12 MED ORDER — CHLORHEXIDINE GLUCONATE CLOTH 2 % EX PADS: 6.0000 | MEDICATED_PAD | Freq: Once | CUTANEOUS | Status: DC

## 2022-05-12 MED ORDER — CHLORHEXIDINE GLUCONATE CLOTH 2 % EX PADS
6.0000 | MEDICATED_PAD | Freq: Once | CUTANEOUS | Status: AC
  Administered 2022-05-13: 6 via TOPICAL

## 2022-05-12 MED ORDER — CEFAZOLIN SODIUM-DEXTROSE 2-4 GM/100ML-% IV SOLN
2.0000 g | INTRAVENOUS | Status: AC
  Administered 2022-05-13: 2 g via INTRAVENOUS

## 2022-05-12 MED ORDER — ACETAMINOPHEN 500 MG PO TABS: 1000.0000 mg | ORAL_TABLET | ORAL | Status: AC

## 2022-05-12 MED ORDER — BUPIVACAINE LIPOSOME 1.3 % IJ SUSP: 20.0000 mL | Freq: Once | INTRAMUSCULAR | Status: DC

## 2022-05-12 MED ORDER — FAMOTIDINE 20 MG PO TABS: 20.0000 mg | ORAL_TABLET | Freq: Once | ORAL | Status: AC

## 2022-05-12 MED ORDER — GABAPENTIN 300 MG PO CAPS: 300.0000 mg | ORAL_CAPSULE | ORAL | Status: AC

## 2022-05-12 MED ORDER — CELECOXIB 200 MG PO CAPS: 200.0000 mg | ORAL_CAPSULE | ORAL | Status: AC

## 2022-05-12 MED ORDER — SODIUM CHLORIDE 0.9 % IV SOLN: INTRAVENOUS | Status: DC

## 2022-05-13 ENCOUNTER — Encounter: Payer: Self-pay | Admitting: Surgery

## 2022-05-13 ENCOUNTER — Telehealth: Payer: Self-pay | Admitting: Family Medicine

## 2022-05-13 ENCOUNTER — Other Ambulatory Visit: Payer: Self-pay

## 2022-05-13 ENCOUNTER — Ambulatory Visit: Payer: Medicare HMO | Admitting: Urgent Care

## 2022-05-13 ENCOUNTER — Ambulatory Visit
Admission: RE | Admit: 2022-05-13 | Discharge: 2022-05-13 | Disposition: A | Discharge status: Home / Self Care | Payer: Medicare HMO | Attending: Surgery | Admitting: Surgery

## 2022-05-13 ENCOUNTER — Telehealth: Payer: Self-pay

## 2022-05-13 ENCOUNTER — Encounter
Admission: RE | Disposition: A | Discharge status: Home / Self Care | Payer: Self-pay | Source: Home / Self Care | Attending: Surgery

## 2022-05-13 ENCOUNTER — Encounter: Payer: Self-pay | Admitting: Family Medicine

## 2022-05-13 DIAGNOSIS — F17210 Nicotine dependence, cigarettes, uncomplicated: Secondary | ICD-10-CM | POA: Insufficient documentation

## 2022-05-13 DIAGNOSIS — Z85828 Personal history of other malignant neoplasm of skin: Secondary | ICD-10-CM | POA: Diagnosis not present

## 2022-05-13 DIAGNOSIS — Z833 Family history of diabetes mellitus: Secondary | ICD-10-CM | POA: Insufficient documentation

## 2022-05-13 DIAGNOSIS — L0591 Pilonidal cyst without abscess: Secondary | ICD-10-CM | POA: Diagnosis not present

## 2022-05-13 DIAGNOSIS — E119 Type 2 diabetes mellitus without complications: Secondary | ICD-10-CM | POA: Diagnosis not present

## 2022-05-13 DIAGNOSIS — N400 Enlarged prostate without lower urinary tract symptoms: Secondary | ICD-10-CM | POA: Insufficient documentation

## 2022-05-13 DIAGNOSIS — G4733 Obstructive sleep apnea (adult) (pediatric): Secondary | ICD-10-CM | POA: Diagnosis not present

## 2022-05-13 DIAGNOSIS — L0501 Pilonidal cyst with abscess: Secondary | ICD-10-CM | POA: Insufficient documentation

## 2022-05-13 DIAGNOSIS — L98491 Non-pressure chronic ulcer of skin of other sites limited to breakdown of skin: Secondary | ICD-10-CM | POA: Diagnosis not present

## 2022-05-13 DIAGNOSIS — I10 Essential (primary) hypertension: Secondary | ICD-10-CM | POA: Insufficient documentation

## 2022-05-13 DIAGNOSIS — R69 Illness, unspecified: Secondary | ICD-10-CM | POA: Diagnosis not present

## 2022-05-13 DIAGNOSIS — I2510 Atherosclerotic heart disease of native coronary artery without angina pectoris: Secondary | ICD-10-CM | POA: Diagnosis not present

## 2022-05-13 DIAGNOSIS — E785 Hyperlipidemia, unspecified: Secondary | ICD-10-CM | POA: Diagnosis not present

## 2022-05-13 DIAGNOSIS — J449 Chronic obstructive pulmonary disease, unspecified: Secondary | ICD-10-CM | POA: Insufficient documentation

## 2022-05-13 DIAGNOSIS — Z86018 Personal history of other benign neoplasm: Secondary | ICD-10-CM | POA: Insufficient documentation

## 2022-05-13 DIAGNOSIS — Z9049 Acquired absence of other specified parts of digestive tract: Secondary | ICD-10-CM | POA: Diagnosis not present

## 2022-05-13 DIAGNOSIS — Z7984 Long term (current) use of oral hypoglycemic drugs: Secondary | ICD-10-CM | POA: Insufficient documentation

## 2022-05-13 DIAGNOSIS — R809 Proteinuria, unspecified: Secondary | ICD-10-CM

## 2022-05-13 HISTORY — DX: Other specified disorders of the skin and subcutaneous tissue: L98.8

## 2022-05-13 HISTORY — DX: Atherosclerosis of aorta: I70.0

## 2022-05-13 HISTORY — PX: PILONIDAL CYST EXCISION: SHX744

## 2022-05-13 HISTORY — DX: Benign prostatic hyperplasia without lower urinary tract symptoms: N40.0

## 2022-05-13 HISTORY — DX: Obstructive sleep apnea (adult) (pediatric): G47.33

## 2022-05-13 HISTORY — DX: Atherosclerotic heart disease of native coronary artery without angina pectoris: I25.10

## 2022-05-13 HISTORY — DX: Benign neoplasm of colon, unspecified: D12.6

## 2022-05-13 HISTORY — DX: Other chest pain: R07.89

## 2022-05-13 HISTORY — DX: Type 2 diabetes mellitus without complications: E11.9

## 2022-05-13 LAB — GLUCOSE, CAPILLARY
Glucose-Capillary: 129 mg/dL — ABNORMAL HIGH (ref 70–99)
Glucose-Capillary: 169 mg/dL — ABNORMAL HIGH (ref 70–99)

## 2022-05-13 SURGERY — EXCISION, PILONIDAL CYST, EXTENSIVE
Anesthesia: General | Site: Buttocks

## 2022-05-13 MED ORDER — BUPIVACAINE HCL (PF) 0.25 % IJ SOLN
INTRAMUSCULAR | Status: AC
  Filled 2022-05-13: qty 30

## 2022-05-13 MED ORDER — PROPOFOL 10 MG/ML IV BOLUS
INTRAVENOUS | Status: AC
  Filled 2022-05-13: qty 40

## 2022-05-13 MED ORDER — FAMOTIDINE 20 MG PO TABS
ORAL_TABLET | ORAL | Status: AC
  Administered 2022-05-13: 20 mg via ORAL
  Filled 2022-05-13: qty 1

## 2022-05-13 MED ORDER — METHYLENE BLUE 1 % INJ SOLN
INTRAVENOUS | Status: AC
  Filled 2022-05-13: qty 10

## 2022-05-13 MED ORDER — CELECOXIB 200 MG PO CAPS
ORAL_CAPSULE | ORAL | Status: AC
  Administered 2022-05-13: 200 mg via ORAL
  Filled 2022-05-13: qty 1

## 2022-05-13 MED ORDER — HYDROGEN PEROXIDE 3 % EX SOLN
CUTANEOUS | Status: DC | PRN
  Administered 2022-05-13: 1

## 2022-05-13 MED ORDER — FENTANYL CITRATE (PF) 100 MCG/2ML IJ SOLN
INTRAMUSCULAR | Status: DC | PRN
  Administered 2022-05-13: 50 ug via INTRAVENOUS

## 2022-05-13 MED ORDER — HYDROCODONE-ACETAMINOPHEN 5-325 MG PO TABS: 1.0000 | ORAL_TABLET | Freq: Four times a day (QID) | ORAL | 0 refills | Status: DC | PRN

## 2022-05-13 MED ORDER — BUPIVACAINE-EPINEPHRINE 0.25% -1:200000 IJ SOLN
INTRAMUSCULAR | Status: DC | PRN
  Administered 2022-05-13: 30 mL

## 2022-05-13 MED ORDER — ONDANSETRON HCL 4 MG/2ML IJ SOLN
INTRAMUSCULAR | Status: AC
  Filled 2022-05-13: qty 2

## 2022-05-13 MED ORDER — CEFAZOLIN SODIUM-DEXTROSE 2-4 GM/100ML-% IV SOLN
INTRAVENOUS | Status: AC
  Filled 2022-05-13: qty 100

## 2022-05-13 MED ORDER — 0.9 % SODIUM CHLORIDE (POUR BTL) OPTIME
TOPICAL | Status: DC | PRN
  Administered 2022-05-13: 300 mL

## 2022-05-13 MED ORDER — ROCURONIUM BROMIDE 10 MG/ML (PF) SYRINGE
PREFILLED_SYRINGE | INTRAVENOUS | Status: AC
  Filled 2022-05-13: qty 10

## 2022-05-13 MED ORDER — ACETAMINOPHEN 500 MG PO TABS
ORAL_TABLET | ORAL | Status: AC
  Administered 2022-05-13: 1000 mg via ORAL
  Filled 2022-05-13: qty 2

## 2022-05-13 MED ORDER — METHYLENE BLUE 1 % INJ SOLN
INTRAVENOUS | Status: DC | PRN
  Administered 2022-05-13: 2 mL via INTRADERMAL

## 2022-05-13 MED ORDER — SUGAMMADEX SODIUM 200 MG/2ML IV SOLN
INTRAVENOUS | Status: DC | PRN
  Administered 2022-05-13: 150 mg via INTRAVENOUS

## 2022-05-13 MED ORDER — ROCURONIUM BROMIDE 100 MG/10ML IV SOLN
INTRAVENOUS | Status: DC | PRN
  Administered 2022-05-13: 50 mg via INTRAVENOUS

## 2022-05-13 MED ORDER — FENTANYL CITRATE (PF) 100 MCG/2ML IJ SOLN: 25.0000 ug | INTRAMUSCULAR | Status: DC | PRN

## 2022-05-13 MED ORDER — MIDAZOLAM HCL 2 MG/2ML IJ SOLN
INTRAMUSCULAR | Status: AC
  Filled 2022-05-13: qty 2

## 2022-05-13 MED ORDER — OXYCODONE HCL 5 MG/5ML PO SOLN: 5.0000 mg | Freq: Once | ORAL | Status: DC | PRN

## 2022-05-13 MED ORDER — EPINEPHRINE PF 1 MG/ML IJ SOLN
INTRAMUSCULAR | Status: AC
  Filled 2022-05-13: qty 1

## 2022-05-13 MED ORDER — ONDANSETRON HCL 4 MG/2ML IJ SOLN
INTRAMUSCULAR | Status: DC | PRN
  Administered 2022-05-13: 4 mg via INTRAVENOUS

## 2022-05-13 MED ORDER — CHLORHEXIDINE GLUCONATE 0.12 % MT SOLN
OROMUCOSAL | Status: AC
  Administered 2022-05-13: 15 mL via OROMUCOSAL
  Filled 2022-05-13: qty 15

## 2022-05-13 MED ORDER — GABAPENTIN 300 MG PO CAPS
ORAL_CAPSULE | ORAL | Status: AC
  Administered 2022-05-13: 300 mg via ORAL
  Filled 2022-05-13: qty 1

## 2022-05-13 MED ORDER — MIDAZOLAM HCL 2 MG/2ML IJ SOLN
INTRAMUSCULAR | Status: DC | PRN
  Administered 2022-05-13: 2 mg via INTRAVENOUS

## 2022-05-13 MED ORDER — OXYCODONE HCL 5 MG PO TABS: 5.0000 mg | ORAL_TABLET | Freq: Once | ORAL | Status: DC | PRN

## 2022-05-13 MED ORDER — PROPOFOL 10 MG/ML IV BOLUS
INTRAVENOUS | Status: DC | PRN
  Administered 2022-05-13: 150 mg via INTRAVENOUS

## 2022-05-13 MED ORDER — FENTANYL CITRATE (PF) 100 MCG/2ML IJ SOLN
INTRAMUSCULAR | Status: AC
  Filled 2022-05-13: qty 2

## 2022-05-13 SURGICAL SUPPLY — 35 items
BLADE SURG 15 STRL LF DISP TIS (BLADE) ×1 IMPLANT
BLADE SURG 15 STRL SS (BLADE) ×1
BRIEF MESH DISP 2XL (UNDERPADS AND DIAPERS) ×1 IMPLANT
DRAPE LAPAROTOMY 100X77 ABD (DRAPES) ×1 IMPLANT
DRSG GAUZE FLUFF 36X18 (GAUZE/BANDAGES/DRESSINGS) ×1 IMPLANT
ELECT CAUTERY BLADE TIP 2.5 (TIP) ×1
ELECT REM PT RETURN 9FT ADLT (ELECTROSURGICAL) ×1
ELECTRODE CAUTERY BLDE TIP 2.5 (TIP) ×1 IMPLANT
ELECTRODE REM PT RTRN 9FT ADLT (ELECTROSURGICAL) ×1 IMPLANT
GAUZE 4X4 16PLY ~~LOC~~+RFID DBL (SPONGE) ×1 IMPLANT
GAUZE PACKING 0.25INX5YD STRL (GAUZE/BANDAGES/DRESSINGS) ×1 IMPLANT
GAUZE PACKING IODOFORM 1/2INX (GAUZE/BANDAGES/DRESSINGS) IMPLANT
GAUZE SPONGE 4X4 12PLY STRL (GAUZE/BANDAGES/DRESSINGS) ×1 IMPLANT
GAUZE VASELINE 3X9 (GAUZE/BANDAGES/DRESSINGS) ×1 IMPLANT
GLOVE ORTHO TXT STRL SZ7.5 (GLOVE) ×2 IMPLANT
GOWN STRL REUS W/ TWL LRG LVL3 (GOWN DISPOSABLE) ×1 IMPLANT
GOWN STRL REUS W/ TWL XL LVL3 (GOWN DISPOSABLE) ×1 IMPLANT
GOWN STRL REUS W/TWL LRG LVL3 (GOWN DISPOSABLE) ×2
GOWN STRL REUS W/TWL XL LVL3 (GOWN DISPOSABLE) ×1
KIT TURNOVER KIT A (KITS) ×1 IMPLANT
MANIFOLD NEPTUNE II (INSTRUMENTS) ×1 IMPLANT
NEEDLE HYPO 22GX1.5 SAFETY (NEEDLE) ×2 IMPLANT
PACK BASIN MINOR ARMC (MISCELLANEOUS) ×1 IMPLANT
PAD ABD DERMACEA PRESS 5X9 (GAUZE/BANDAGES/DRESSINGS) IMPLANT
SOL PREP PVP 2OZ (MISCELLANEOUS) ×2
SOLUTION PREP PVP 2OZ (MISCELLANEOUS) ×1 IMPLANT
SURGILUBE 2OZ TUBE FLIPTOP (MISCELLANEOUS) IMPLANT
SUT CHROMIC 3 0 SH 27 (SUTURE) IMPLANT
SUT PROLENE 2 0 SH DA (SUTURE) IMPLANT
SUT PROLENE 3 0 SH DA (SUTURE) IMPLANT
SWABSTK COMLB BENZOIN TINCTURE (MISCELLANEOUS) ×1 IMPLANT
SYR 10ML LL (SYRINGE) ×1 IMPLANT
SYR 20ML LL LF (SYRINGE) ×2 IMPLANT
TAPE CLOTH 3X10 WHT NS LF (GAUZE/BANDAGES/DRESSINGS) ×1 IMPLANT
TRAP FLUID SMOKE EVACUATOR (MISCELLANEOUS) ×1 IMPLANT

## 2022-05-13 NOTE — Anesthesia Procedure Notes (Signed)
Procedure Name: Intubation Date/Time: 05/13/2022 7:41 AM  Performed by: Gentry Fitz, CRNAPre-anesthesia Checklist: Patient identified, Emergency Drugs available, Suction available and Patient being monitored Patient Re-evaluated:Patient Re-evaluated prior to induction Oxygen Delivery Method: Circle system utilized Preoxygenation: Pre-oxygenation with 100% oxygen Induction Type: IV induction Ventilation: Mask ventilation without difficulty Laryngoscope Size: McGraph and 4 Grade View: Grade I Tube type: Oral Tube size: 7.5 mm Number of attempts: 1 Airway Equipment and Method: Stylet and Oral airway Placement Confirmation: ETT inserted through vocal cords under direct vision, positive ETCO2 and breath sounds checked- equal and bilateral Tube secured with: Tape Dental Injury: Teeth and Oropharynx as per pre-operative assessment

## 2022-05-13 NOTE — Anesthesia Postprocedure Evaluation (Signed)
Anesthesia Post Note  Patient: Mamoudou Mulvehill.  Procedure(s) Performed: CYST EXCISION PILONIDAL EXTENSIVE (Buttocks)  Patient location during evaluation: PACU Anesthesia Type: General Level of consciousness: awake and alert Pain management: pain level controlled Vital Signs Assessment: post-procedure vital signs reviewed and stable Respiratory status: spontaneous breathing, nonlabored ventilation, respiratory function stable and patient connected to nasal cannula oxygen Cardiovascular status: blood pressure returned to baseline and stable Postop Assessment: no apparent nausea or vomiting Anesthetic complications: no   No notable events documented.   Last Vitals:  Vitals:   05/13/22 0915 05/13/22 0933  BP: (!) 102/53 128/63  Pulse: (!) 51 62  Resp: 10 17  Temp:  36.4 C  SpO2: 98% 99%    Last Pain:  Vitals:   05/13/22 0933  TempSrc: Temporal  PainSc: 0-No pain                 Precious Haws Dhruti Ghuman

## 2022-05-13 NOTE — Telephone Encounter (Signed)
Copied from [place] [id number]. Topic: Medicare AWV >> May 13, 2022 11:22 AM Oley Balm A wrote: Reason for CRM: Pt has his AWV scheduled for 05/16/22 at 2:30pm and is needing to reschedule.  Please advise

## 2022-05-13 NOTE — Anesthesia Procedure Notes (Signed)
Procedure Name: Intubation Date/Time: 05/13/2022 7:41 AM  Performed by: Gentry Fitz, CRNAPre-anesthesia Checklist: Patient identified, Emergency Drugs available, Suction available and Patient being monitored Patient Re-evaluated:Patient Re-evaluated prior to induction Oxygen Delivery Method: Circle system utilized Preoxygenation: Pre-oxygenation with 100% oxygen Induction Type: IV induction Ventilation: Mask ventilation without difficulty Laryngoscope Size: McGraph and 4 Grade View: Grade I Tube type: Oral Tube size: 7.5 mm Number of attempts: 1 Airway Equipment and Method: Stylet and Oral airway Placement Confirmation: ETT inserted through vocal cords under direct vision, positive ETCO2 and breath sounds checked- equal and bilateral Secured at: 23 cm Tube secured with: Tape Dental Injury: Teeth and Oropharynx as per pre-operative assessment

## 2022-05-13 NOTE — Discharge Instructions (Signed)

## 2022-05-13 NOTE — Telephone Encounter (Signed)
Pt is calling to reschedule AWV. CB- [phone]

## 2022-05-13 NOTE — Interval H&P Note (Signed)
History and Physical Interval Note:  05/13/2022 7:21 AM  David [name].  has presented today for surgery, with the diagnosis of pilonidal cyst.  The various methods of treatment have been discussed with the patient and family. After consideration of risks, benefits and other options for treatment, the patient has consented to  Procedure(s): CYST EXCISION PILONIDAL EXTENSIVE (N/A) as a surgical intervention.  The patient's history has been reviewed, patient examined, no change in status, stable for surgery.  I have reviewed the patient's chart and labs.  Questions were answered to the patient's satisfaction.     Ronny Bacon

## 2022-05-13 NOTE — Transfer of Care (Signed)
Immediate Anesthesia Transfer of Care Note  Patient: David [name].  Procedure(s) Performed: CYST EXCISION PILONIDAL EXTENSIVE (Buttocks)  Patient Location: PACU  Anesthesia Type:General  Level of Consciousness: awake, drowsy, and patient cooperative  Airway & Oxygen Therapy: Patient connected to face mask  Post-op Assessment: Report given to RN and Post -op Vital signs reviewed and stable  Post vital signs: Reviewed and stable  Last Vitals:  Vitals Value Taken Time  BP 121/59 05/13/22 0833  Temp    Pulse 63 05/13/22 0837  Resp 17 05/13/22 0837  SpO2 99 % 05/13/22 0837  Vitals shown include unvalidated device data.  Last Pain:  Vitals:   05/13/22 0651  TempSrc: Temporal  PainSc: 0-No pain         Complications: No notable events documented.

## 2022-05-13 NOTE — H&P (Signed)
Chief Complaint: Pilonidal cyst   History of Present Illness David [name]. is a 77 y.o. 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  Had I&D, twice, currently taking antibiotics for flareup that began late November.  Denies any current drainage.  Has had intermittent tenderness exacerbated by direct pressure on multiple occasions.  Has no prior history of surgery of the area.  Now wants to proceed with more definitive excisional treatment.   Past Medical History     Past Medical History:  Diagnosis Date   Actinic keratosis 01/13/2017    R upper back post base of neck - biopsy proven    Allergy     COPD (chronic obstructive pulmonary disease) ([place])      misdiagnosed   Depression     Diabetes mellitus without complication ([place])     Dysplastic nevus 12/23/2007    R med mid post thigh - slight atypia   Dysplastic nevus 12/22/2008    L mid ant thigh - slight atypia   Dysplastic nevus 05/10/2020    RUQA lat, severia atypia, excised 07/11/20   Hyperlipidemia     Hypertension     Melanoma ([place]) 1988    MM - clavicle, txt[facility] at [facility]   Microalbuminuria     Sleep apnea     Squamous cell carcinoma of skin 07/18/2020    nasal tip - Aypical Squamous Proliferation with Desmoplasia. Atypical squamous nests are concerning for invasive SCC. MOHs 09/11/2020             Past Surgical History:  Procedure Laterality Date   broken right arm       CHOLECYSTECTOMY   2001   COLONOSCOPY WITH PROPOFOL N/A 05/08/2018    Procedure: COLONOSCOPY WITH PROPOFOL;  Surgeon: Manya Silvas, MD;  Location: [facility] ENDOSCOPY;  Service: Endoscopy;  Laterality: N/A;   COLONOSCOPY WITH PROPOFOL N/A 10/30/2020    Procedure: COLONOSCOPY WITH PROPOFOL;  Surgeon: Toledo, Benay Pike, MD;  Location: [facility] ENDOSCOPY;  Service: Gastroenterology;  Laterality: N/A;   MELANOMA EXCISION   1998   TONSILLECTOMY AND ADENOIDECTOMY   1953           Allergies   Allergen Reactions   Alcohol Other (See Comments)      Addiction            Current Outpatient Medications  Medication Sig Dispense Refill   aspirin EC 81 MG tablet Take 1 tablet (81 mg total) by mouth daily. Swallow whole. 90 tablet 3   atorvastatin (LIPITOR) 40 MG tablet Take 1 tablet by mouth daily.       doxycycline (VIBRA-TABS) 100 MG tablet Take 1 tablet (100 mg total) by mouth 2 (two) times daily for 7 days. 14 tablet 0   fluticasone (FLONASE) 50 MCG/ACT nasal spray Place 2 sprays into both nostrils daily. 48 g 3   lisinopril (ZESTRIL) 5 MG tablet Take 1 tablet by mouth daily.       loratadine (CLARITIN) 10 MG tablet Take 1 tablet (10 mg total) by mouth daily. 90 tablet 3   meloxicam (MOBIC) 15 MG tablet Take 1 tablet (15 mg total) by mouth as needed. (Patient taking differently: Take 15 mg by mouth as needed. Rarely needs) 90 tablet 3   metFORMIN (GLUCOPHAGE) 500 MG tablet TAKE 1 TABLET(500 MG) BY MOUTH AT BEDTIME 90 tablet 1   tamsulosin (FLOMAX) 0.4 MG CAPS capsule TAKE 1 CAPSULE(0.4 MG) BY MOUTH DAILY 90 capsule 1  No current facility-administered medications for this visit.      Family History      Family History  Problem Relation Age of Onset   Cervical cancer Mother     Colon cancer Mother     Colon polyps Brother     Cervical cancer Maternal Grandmother     Diabetes Maternal Grandmother     Tuberculosis Maternal Grandmother     Lung cancer Maternal Grandfather     Cervical cancer Maternal Aunt          Social History Social History         Tobacco Use   Smoking status: Every Day      Packs/day: 1.00      Years: 52.00      Total pack years: 52.00      Types: Cigarettes   Smokeless tobacco: Never   Tobacco comments:      Pt started smoking again. Is smoking 1.25-1.5 PPD  Vaping Use   Vaping Use: Never used  Substance Use Topics   Alcohol use: Not Currently      Comment: Previous Heavy Alcohol Abuse, sober   Drug use: Not Currently     Review  of Systems  Constitutional: Negative.   HENT: Negative.    Eyes: Negative.   Respiratory:  Positive for cough.   Cardiovascular: Negative.   Gastrointestinal: Negative.   Genitourinary: Negative.   Skin: Negative.   Neurological: Negative.   Psychiatric/Behavioral: Negative.      Physical Exam Blood pressure 134/74, pulse 80, temperature 98.2 F (36.8 C), temperature source Oral, height '5\' 7"'$  (1.702 m), weight 206 lb (93.4 kg), SpO2 99 %.  CONSTITUTIONAL: Well developed, and nourished, appropriately responsive and aware without distress.   EYES: Sclera non-icteric.   EARS, NOSE, MOUTH AND THROAT: Mask worn.   The oropharynx is clear. Oral mucosa is pink and moist.    Hearing is intact to voice.  NECK: Trachea is midline, and there is no jugular venous distension.  LYMPH NODES:  Lymph nodes in the neck are not appreciated. RESPIRATORY:  Lungs are clear, and breath sounds are equal bilaterally. Normal respiratory effort without pathologic use of accessory muscles. CARDIOVASCULAR: Heart is regular in rate and rhythm.  Well perfused.  GI: The abdomen is soft, nontender, and nondistended. There were no palpable masses. I did not appreciate hepatosplenomegaly. There were normal bowel sounds. GU: [place] cleft there is an irregular area of what feels like chronic cystic lesion.  There is only 1 area that appears scarred.  There are no significant central/midline pits.  There is no evidence of induration or edema.  No discharge, no marked tenderness with palpation. MUSCULOSKELETAL:  Symmetrical muscle tone appreciated in all four extremities.    SKIN: Skin turgor is normal. No pathologic skin lesions appreciated.  NEUROLOGIC:  Motor and sensation appear grossly normal.  Cranial nerves are grossly without defect. PSYCH:  Alert and oriented to person, place and time. Affect is appropriate for situation.   Data Reviewed I have personally reviewed what is currently available of the patient's  imaging, recent labs and medical records.   Labs:      Latest Ref Rng & Units 12/13/2021    3:26 PM 01/19/2015    8:48 AM 04/01/2012    6:03 AM  CBC  WBC 3.4 - 10.8 x10E3/uL 10.0  8.3  7.1   Hemoglobin 13.0 - 17.7 g/dL 16.2  14.1  12.5   Hematocrit 37.5 - 51.0 % 47.6  41.7  36.1   Platelets 150 - 450 x10E3/uL 221  245  184         Latest Ref Rng & Units 02/11/2022    2:46 PM 12/13/2021    3:26 PM 05/21/2021   10:04 AM  CMP  Glucose 70 - 99 mg/dL 208  170  171   BUN 8 - 23 mg/dL '13  13  13   '$ Creatinine 0.61 - 1.24 mg/dL 0.87  0.97  0.98   Sodium 135 - 145 mmol/L 138  135  140   Potassium 3.5 - 5.1 mmol/L 4.2  5.0  5.6   Chloride 98 - 111 mmol/L 106  101  103   CO2 22 - 32 mmol/L '28  24  24   '$ Calcium 8.9 - 10.3 mg/dL 9.0  9.7  9.9   Total Protein 6.0 - 8.5 g/dL   6.4  6.4   Total Bilirubin 0.0 - 1.2 mg/dL   0.4  0.6   Alkaline Phos 44 - 121 IU/L   99  95   AST 0 - 40 IU/L   22  24   ALT 0 - 44 IU/L   27  38     Assessment Pilonidal cystic disease     Patient Active Problem List    Diagnosis Date Noted   Annual physical exam 12/13/2021   Diabetes mellitus type 2 in obese ([place]) 12/13/2021   Atypical chest pain 12/13/2021   Cellulitis 08/28/2021   Dermatitis due to plants, including poison ivy, sumac, and oak 08/28/2021   Recurrent AOM (acute otitis media) 01/25/2021   Caregiver stress 10/20/2017   BPH (benign prostatic hyperplasia) 10/21/2014   COPD, mild ([place]) 10/21/2014   Type 2 diabetes mellitus with microalbuminuria, without long-term current use of insulin ([place]) 10/21/2014   Failure of erection 10/21/2014   Hyperlipidemia associated with type 2 diabetes mellitus ([place]) 10/21/2014   Microalbuminuria due to type 2 diabetes mellitus ([place]) 10/21/2014   Obesity 10/21/2014   Allergic rhinitis 10/21/2014   Apnea, sleep 10/21/2014   Compulsive tobacco user syndrome 10/21/2014     Plan We discussed options, landing on excision with secondary healing aided by negative  pressure wound therapy. As he would like to keep working, he thought the negative pressure therapy may be the best assistance to make good progress while still being ambulatory.   Risks of anesthesia, anesthesia positioning, bleeding, infection, recurrence, etc. discussed in detail.  I believe he understands these are not all inclusive.  Questions have been answered.  No guarantees ever expressed or implied.

## 2022-05-13 NOTE — Anesthesia Preprocedure Evaluation (Signed)
Anesthesia Evaluation  Patient identified by MRN, date of birth, ID band Patient awake    Reviewed: Allergy & Precautions, NPO status , Patient's Chart, lab work & pertinent test results  History of Anesthesia Complications Negative for: history of anesthetic complications  Airway Mallampati: III  TM Distance: <3 FB Neck ROM: full    Dental  (+) Chipped, Edentulous Upper   Pulmonary neg shortness of breath, sleep apnea , COPD, Current Smoker and Patient abstained from smoking.   Pulmonary exam normal        Cardiovascular Exercise Tolerance: Good hypertension, + CAD  (-) DOE Normal cardiovascular exam     Neuro/Psych  PSYCHIATRIC DISORDERS      negative neurological ROS     GI/Hepatic negative GI ROS, Neg liver ROS,,,  Endo/Other  diabetes, Type 2    Renal/GU Renal disease     Musculoskeletal   Abdominal   Peds  Hematology negative hematology ROS (+)   Anesthesia Other Findings Past Medical History: 01/13/2017: Actinic keratosis     Comment:  R upper back post base of neck - biopsy proven  No date: Adenomatous colon polyp No date: Allergy No date: Aortic atherosclerosis (HCC) No date: Atypical chest pain No date: BPH (benign prostatic hyperplasia) No date: CAD (coronary artery disease)     Comment:  a.) CT chest 07/26/2021: 2v CAD; b.) cCTA 02/18/2022: CA              score 159 (38th percentile for age/sex match); 25-49%               pLAD and RCA distribution. No date: COPD (chronic obstructive pulmonary disease) (HCC) No date: Depression 76/16/0737: Diastolic dysfunction     Comment:  a.) TTE 04/09/2022: EF 60-65%, mild LVH, AoV sclerosis,               G1DD 12/23/2007: Dysplastic nevus     Comment:  R med mid post thigh - slight atypia 12/22/2008: Dysplastic nevus     Comment:  L mid ant thigh - slight atypia 05/10/2020: Dysplastic nevus     Comment:  RUQA lat, severia atypia, excised 07/11/20 No  date: Hyperlipidemia No date: Hypertension 1988: Melanoma ([place])     Comment:  MM - clavicle, txted at [facility] No date: Microalbuminuria No date: OSA on CPAP No date: Pilonidal disease 07/18/2020: Squamous cell carcinoma of skin     Comment:  nasal tip - Aypical Squamous Proliferation with               Desmoplasia. Atypical squamous nests are concerning for               invasive SCC. MOHs 09/11/2020 No date: T2DM (type 2 diabetes mellitus) ([place])  Past Surgical History: No date: broken right arm     Comment:  in seventh grade 2001: CHOLECYSTECTOMY 05/08/2018: COLONOSCOPY WITH PROPOFOL; N/A     Comment:  Procedure: COLONOSCOPY WITH PROPOFOL;  Surgeon: Manya Silvas, MD;  Location: [facility] ENDOSCOPY;  Service:               Endoscopy;  Laterality: N/A; 10/30/2020: COLONOSCOPY WITH PROPOFOL; N/A     Comment:  Procedure: COLONOSCOPY WITH PROPOFOL;  Surgeon: Toledo,               Benay Pike, MD;  Location: [facility] ENDOSCOPY;  Service:  Gastroenterology;  Laterality: N/A; 1998: MELANOMA EXCISION 1953: TONSILLECTOMY AND ADENOIDECTOMY  BMI    Body Mass Index: 31.32 kg/m      Reproductive/Obstetrics negative OB ROS                             Anesthesia Physical Anesthesia Plan  ASA: 3  Anesthesia Plan: General ETT   Post-op Pain Management:    Induction: Intravenous  PONV Risk Score and Plan: Ondansetron, Dexamethasone, Midazolam and Treatment may vary due to age or medical condition  Airway Management Planned: Oral ETT  Additional Equipment:   Intra-op Plan:   Post-operative Plan: Extubation in OR  Informed Consent: I have reviewed the patients History and Physical, chart, labs and discussed the procedure including the risks, benefits and alternatives for the proposed anesthesia with the patient or authorized representative who has indicated his/her understanding and acceptance.     Dental Advisory Given  Plan Discussed  with: Anesthesiologist, CRNA and Surgeon  Anesthesia Plan Comments: (Patient consented for risks of anesthesia including but not limited to:  - adverse reactions to medications - damage to eyes, teeth, lips or other oral mucosa - nerve damage due to positioning  - sore throat or hoarseness - Damage to heart, brain, nerves, lungs, other parts of body or loss of life  Patient voiced understanding.)       Anesthesia Quick Evaluation

## 2022-05-13 NOTE — Op Note (Signed)
  05/13/2022  8:30 AM  PATIENT:  David [name].  77 y.o. male  PRE-OPERATIVE DIAGNOSIS:  Pilonidal cyst, history of abscess  POST-OPERATIVE DIAGNOSIS:  Same  PROCEDURE:  1.  Excision of complex pilonidal cyst  SURGEON:  Surgeon(s) and Role:    Ronny Bacon, MD - Primary  ANESTHESIA: GETA  INDICATIONS FOR PROCEDURE history of pilonidal abscess, with persistent cyst  DICTATION:  Patient engaged in discussion about proceeding,  detail of procedure, risk benefits possible complications and a consent was obtained. The patient taken to the operating room and placed in the prone position.  The single pilonidal sinus tract was utilized and a 22-gauge Angiocath was placed with easy cannulation, and the cystic cavity was then stained with a one-to-one ratio of methylene blue with hydroperoxide.  Incision was created, and all the blue stained cystic cavity, and pilonidal tracts were excised with electrosurgery. Hemostasis was obtained with electrocautery. Irrigation with normal saline, and the wound was packed with half-inch packing. Marcaine quarter percent with epinephrine was injected around the wound site. Needle and laparotomy counts were correct and there were no immediate complications.   An ABD was applied and secured on 3 aspects. The resultant wound measured approximately 3 cm from cranial to caudal, 1.5 cm wide and 1.2 cm in depth.  Ronny Bacon, MD

## 2022-05-14 ENCOUNTER — Ambulatory Visit (INDEPENDENT_AMBULATORY_CARE_PROVIDER_SITE_OTHER): Payer: Medicare HMO

## 2022-05-14 ENCOUNTER — Encounter: Payer: Self-pay | Admitting: Surgery

## 2022-05-14 DIAGNOSIS — L0591 Pilonidal cyst without abscess: Secondary | ICD-10-CM

## 2022-05-14 LAB — SURGICAL PATHOLOGY

## 2022-05-14 NOTE — Progress Notes (Signed)
Patient ID: David [name]., male   DOB: May 14, 1945, 77 y.o.   MRN: [id number] Patient came in today for a wound check.  The wound is clean, with no signs of infection noted. Packing removed and replaced with 1/2 inch plane packing. Follow up as scheduled.

## 2022-05-15 ENCOUNTER — Ambulatory Visit: Payer: Medicare HMO

## 2022-05-15 DIAGNOSIS — L0591 Pilonidal cyst without abscess: Secondary | ICD-10-CM

## 2022-05-15 NOTE — Progress Notes (Signed)
Packing strip removed. Wound looks normal. Minimal drainage. New packing placed without difficulty. Dry dressing placed over the area and secured with tape.

## 2022-05-16 ENCOUNTER — Ambulatory Visit (INDEPENDENT_AMBULATORY_CARE_PROVIDER_SITE_OTHER): Payer: Medicare HMO

## 2022-05-16 ENCOUNTER — Encounter: Payer: Self-pay | Admitting: Family Medicine

## 2022-05-16 DIAGNOSIS — E1169 Type 2 diabetes mellitus with other specified complication: Secondary | ICD-10-CM

## 2022-05-16 DIAGNOSIS — L0591 Pilonidal cyst without abscess: Secondary | ICD-10-CM

## 2022-05-16 NOTE — Patient Instructions (Signed)
Follow up as scheduled.  

## 2022-05-16 NOTE — Progress Notes (Signed)
Patient ID: David Mutter., male   DOB: 10/06/1945, 77 y.o.   MRN: [id number] Patient came in today for a wound check. Packing removed and wound is without infection. The wound is clean, with no signs of infection noted. Follow up as scheduled.

## 2022-05-17 ENCOUNTER — Ambulatory Visit (INDEPENDENT_AMBULATORY_CARE_PROVIDER_SITE_OTHER): Payer: Medicare HMO

## 2022-05-17 DIAGNOSIS — L0591 Pilonidal cyst without abscess: Secondary | ICD-10-CM

## 2022-05-17 NOTE — Progress Notes (Signed)
Patient came in today for a wound check.  The wound is clean, with no signs of infection noted. Packing changed and gauze placed. Follow up as scheduled.

## 2022-05-17 NOTE — Patient Instructions (Signed)
Follow up as scheduled.  

## 2022-05-20 ENCOUNTER — Ambulatory Visit (INDEPENDENT_AMBULATORY_CARE_PROVIDER_SITE_OTHER): Payer: Medicare HMO

## 2022-05-20 DIAGNOSIS — L0591 Pilonidal cyst without abscess: Secondary | ICD-10-CM

## 2022-05-20 NOTE — Patient Instructions (Signed)
Please see your scheduled follow up visit below.

## 2022-05-20 NOTE — Progress Notes (Signed)
Here today for wound check and repacking of the wound. Wound looks free of pus. No redness around the wound.  Minimal drainage. Removed old packing and replaced with new packing.

## 2022-05-21 ENCOUNTER — Ambulatory Visit (INDEPENDENT_AMBULATORY_CARE_PROVIDER_SITE_OTHER): Payer: Medicare HMO

## 2022-05-21 ENCOUNTER — Encounter: Payer: Self-pay | Admitting: Cardiology

## 2022-05-21 DIAGNOSIS — L0591 Pilonidal cyst without abscess: Secondary | ICD-10-CM

## 2022-05-21 MED ORDER — METFORMIN HCL 500 MG PO TABS: ORAL_TABLET | ORAL | 1 refills | Status: DC

## 2022-05-21 MED ORDER — LISINOPRIL 5 MG PO TABS: 5.0000 mg | ORAL_TABLET | Freq: Every day | ORAL | 1 refills | Status: DC

## 2022-05-21 MED ORDER — ATORVASTATIN CALCIUM 40 MG PO TABS: 40.0000 mg | ORAL_TABLET | Freq: Every day | ORAL | 1 refills | Status: DC

## 2022-05-21 MED ORDER — TAMSULOSIN HCL 0.4 MG PO CAPS: ORAL_CAPSULE | ORAL | 1 refills | Status: DC

## 2022-05-21 MED ORDER — FLUTICASONE PROPIONATE 50 MCG/ACT NA SUSP: 2.0000 | Freq: Every day | NASAL | 3 refills | Status: DC

## 2022-05-21 NOTE — Patient Instructions (Signed)
Follow up as scheduled.  

## 2022-05-21 NOTE — Progress Notes (Signed)
Patient ID: David Mutter., male   DOB: 1946/04/10, 77 y.o.   MRN: [id number] Patient came in today for a wound check. Packing removed. The wound is clean, with no signs of infection noted. Wound repacked with 1/2 inch iodoform packing and gauze placed and taped.  Follow up as scheduled.

## 2022-05-22 ENCOUNTER — Ambulatory Visit (INDEPENDENT_AMBULATORY_CARE_PROVIDER_SITE_OTHER): Payer: Medicare HMO

## 2022-05-22 DIAGNOSIS — Z09 Encounter for follow-up examination after completed treatment for conditions other than malignant neoplasm: Secondary | ICD-10-CM

## 2022-05-22 DIAGNOSIS — L0591 Pilonidal cyst without abscess: Secondary | ICD-10-CM

## 2022-05-22 NOTE — Progress Notes (Signed)
Patient ID: David Mutter., male   DOB: 1946/04/03, 77 y.o.   MRN: [id number] Patient came in today for a wound check. Packing removed. The wound is clean, with no signs of infection noted. Area repacked with 1/2 inch Iodoform and top gauze placed and taped. Follow up as scheduled.

## 2022-05-22 NOTE — Progress Notes (Signed)
[facility] POST-OP OFFICE VISIT  05/24/2022  HPI: David [name]. is a 77 y.o. male 10 days s/p excision of pilonidal disease.  Wound care has been completed by hooker by crook.  However it is getting done, he is keeping it clean with bathing.  Vital signs: BP (!) 142/75   Pulse 72   Temp 98 F (36.7 C)   Ht '5\' 7"'$  (1.702 m)   Wt 200 lb (90.7 kg)   SpO2 98%   BMI 31.32 kg/m    Physical Exam: Constitutional: Appears at his baseline.  Skin: At as expected natal cleft wound with beefy granulation tissue, clean.  No evidence of erythema or induration.  No evidence of discharge or malodor.  No evidence of proud flesh or hypergranulation at this time.  Assessment/Plan: This is a 77 y.o. male 10 days s/p pilonidal disease.  Patient Active Problem List   Diagnosis Date Noted   Pilonidal cyst 04/04/2022   Annual physical exam 12/13/2021   Diabetes mellitus type 2 in obese ([name]) 12/13/2021   Atypical chest pain 12/13/2021   Cellulitis 08/28/2021   Dermatitis due to plants, including poison ivy, sumac, and oak 08/28/2021   Recurrent AOM (acute otitis media) 01/25/2021   Caregiver stress 10/20/2017   BPH (benign prostatic hyperplasia) 10/21/2014   COPD, mild ([place]) 10/21/2014   Type 2 diabetes mellitus with microalbuminuria, without long-term current use of insulin ([name]) 10/21/2014   Failure of erection 10/21/2014   Hyperlipidemia associated with type 2 diabetes mellitus ([name]) 10/21/2014   Microalbuminuria due to type 2 diabetes mellitus ([name]) 10/21/2014   Obesity 10/21/2014   Allergic rhinitis 10/21/2014   Apnea, sleep 10/21/2014   Compulsive tobacco user syndrome 10/21/2014    -Continue dressing changes on minimum of daily basis, once again we emphasized the importance of keeping this wound open as long as possible to enable healing to its depth.  Will have him follow-up in a couple weeks.    Ronny Bacon M.D., FACS 05/24/2022, 5:07 PM

## 2022-05-22 NOTE — Patient Instructions (Signed)
Follow-up with our office as scheduled.   Please call and ask to speak with a nurse if you develop questions or concerns.

## 2022-05-23 ENCOUNTER — Encounter: Payer: Self-pay | Admitting: Surgery

## 2022-05-23 ENCOUNTER — Ambulatory Visit (INDEPENDENT_AMBULATORY_CARE_PROVIDER_SITE_OTHER): Payer: Medicare HMO | Admitting: Surgery

## 2022-05-23 VITALS — BP 142/75 | HR 72 | Temp 98.0°F | Ht 67.0 in | Wt 200.0 lb

## 2022-05-23 DIAGNOSIS — L0591 Pilonidal cyst without abscess: Secondary | ICD-10-CM

## 2022-05-23 NOTE — Patient Instructions (Signed)
Follow up as scheduled.  

## 2022-05-24 ENCOUNTER — Ambulatory Visit: Payer: Medicare HMO

## 2022-05-27 ENCOUNTER — Ambulatory Visit (INDEPENDENT_AMBULATORY_CARE_PROVIDER_SITE_OTHER): Payer: Medicare HMO

## 2022-05-27 DIAGNOSIS — L0591 Pilonidal cyst without abscess: Secondary | ICD-10-CM

## 2022-05-27 MED ORDER — FLUCONAZOLE 100 MG PO TABS: 100.0000 mg | ORAL_TABLET | Freq: Every day | ORAL | 0 refills | Status: AC

## 2022-05-27 NOTE — Progress Notes (Signed)
Patient ID: David Mutter., male   DOB: Aug 15, 1945, 77 y.o.   MRN: [id number] Patient came in today for a wound check.  The wound is clean, with no signs of infection noted. There is a reddened slightly raised area just surrounding the wound extending to the perineum. This was looked at by Dr Olean Ree. He prescribed Diflucan 100 mg po daily for 7 days. Follow up as scheduled.

## 2022-05-27 NOTE — Patient Instructions (Signed)
Follow up as scheduled. Try to keep the area as dry as possible. Complete all of your medications.

## 2022-05-28 ENCOUNTER — Encounter: Payer: Medicare HMO | Admitting: Physician Assistant

## 2022-05-28 ENCOUNTER — Ambulatory Visit (INDEPENDENT_AMBULATORY_CARE_PROVIDER_SITE_OTHER): Payer: Medicare HMO

## 2022-05-28 VITALS — BP 130/70 | Ht 67.0 in | Wt 201.5 lb

## 2022-05-28 DIAGNOSIS — L0591 Pilonidal cyst without abscess: Secondary | ICD-10-CM

## 2022-05-28 DIAGNOSIS — Z0000 Encounter for general adult medical examination without abnormal findings: Secondary | ICD-10-CM

## 2022-05-28 NOTE — Progress Notes (Signed)
Subjective:   David Paige. is a 77 y.o. male who presents for Medicare Annual/Subsequent preventive examination.  Review of Systems     Cardiac Risk Factors include: advanced age (>50mn, >>50women);diabetes mellitus;male gender     Objective:    Today's Vitals   05/28/22 0944 05/28/22 1003  BP: (!) 164/78 130/70  Weight: 201 lb 8 oz (91.4 kg)   Height: '5\' 7"'$  (1.702 m)    Body mass index is 31.56 kg/m.     05/28/2022    9:53 AM 05/13/2022    6:17 AM 04/12/2022   12:58 PM 05/15/2021    4:00 PM 10/30/2020    1:22 PM 05/09/2020    9:55 AM 04/15/2019    2:50 PM  Advanced Directives  Does Patient Have a Medical Advance Directive? No No Yes No Yes Yes Yes  Type of Advance Directive   Living will;Healthcare Power of [place]  Copy of [place] Chart?      No - copy requested No - copy requested  Would patient like information on creating a medical advance directive? No - Patient declined No - Patient declined  No - Patient declined       Current Medications (verified) Outpatient Encounter Medications as of 05/28/2022  Medication Sig   aspirin EC 81 MG tablet Take 1 tablet (81 mg total) by mouth daily. Swallow whole.   atorvastatin (LIPITOR) 40 MG tablet Take 1 tablet (40 mg total) by mouth daily. Take 1 tablet by mouth daily.   fluconazole (DIFLUCAN) 100 MG tablet Take 1 tablet (100 mg total) by mouth daily for 7 days.   fluticasone (FLONASE) 50 MCG/ACT nasal spray Place 2 sprays into both nostrils daily.   lisinopril (ZESTRIL) 5 MG tablet Take 1 tablet (5 mg total) by mouth daily. Take 1 tablet by mouth daily.   loratadine (CLARITIN) 10 MG tablet Take 1 tablet (10 mg total) by mouth daily.   meloxicam (MOBIC) 15 MG tablet Take 1 tablet (15 mg total) by mouth as needed. (Patient taking differently: Take 15 mg by mouth as needed. Rarely needs)   metFORMIN (GLUCOPHAGE) 500 MG  tablet TAKE 1 TABLET(500 MG) BY MOUTH AT BEDTIME   Multiple Vitamin (MULTIVITAMIN ADULT PO) Take by mouth.   nitroGLYCERIN (NITROSTAT) 0.4 MG SL tablet Place 1 tablet (0.4 mg total) under the tongue every 5 (five) minutes as needed for chest pain. You can repeat 3 time - after the 3rd time you will need to call 911   tamsulosin (FLOMAX) 0.4 MG CAPS capsule TAKE 1 CAPSULE(0.4 MG) BY MOUTH DAILY   No facility-administered encounter medications on file as of 05/28/2022.    Allergies (verified) Alcohol   History: Past Medical History:  Diagnosis Date   Actinic keratosis 01/13/2017   R upper back post base of neck - biopsy proven    Adenomatous colon polyp    Allergy    Aortic atherosclerosis (HCC)    Atypical chest pain    BPH (benign prostatic hyperplasia)    CAD (coronary artery disease)    a.) CT chest 07/26/2021: 2v CAD; b.) cCTA 02/18/2022: CA score 159 (38th percentile for age/sex match); 25-49% pLAD and RCA distribution.   COPD (chronic obstructive pulmonary disease) [place]    Depression    Diastolic dysfunction 116/02/9603  a.) TTE 04/09/2022: EF 60-65%, mild LVH, AoV sclerosis, G1DD   Dysplastic nevus 12/23/2007  R med mid post thigh - slight atypia   Dysplastic nevus 12/22/2008   L mid ant thigh - slight atypia   Dysplastic nevus 05/10/2020   RUQA lat, severia atypia, excised 07/11/20   Hyperlipidemia    Hypertension    Melanoma ([place]) 1988   MM - clavicle, [facility]   Microalbuminuria    OSA on CPAP    Pilonidal disease    Squamous cell carcinoma of skin 07/18/2020   nasal tip - Aypical Squamous Proliferation with Desmoplasia. Atypical squamous nests are concerning for invasive SCC. MOHs 09/11/2020   T2DM (type 2 diabetes mellitus) [facility])    Past Surgical History:  Procedure Laterality Date   broken right arm     in seventh grade   CHOLECYSTECTOMY  2001   COLONOSCOPY WITH PROPOFOL N/A 05/08/2018   Procedure: COLONOSCOPY WITH PROPOFOL;  Surgeon: Manya Silvas, MD;  Location: [facility] ENDOSCOPY;  Service: Endoscopy;  Laterality: N/A;   COLONOSCOPY WITH PROPOFOL N/A 10/30/2020   Procedure: COLONOSCOPY WITH PROPOFOL;  Surgeon: Toledo, Benay Pike, MD;  Location: [facility] ENDOSCOPY;  Service: Gastroenterology;  Laterality: N/A;   MELANOMA EXCISION  1998   PILONIDAL CYST EXCISION N/A 05/13/2022   Procedure: CYST EXCISION PILONIDAL EXTENSIVE;  Surgeon: Ronny Bacon, MD;  Location: [facility] ORS;  Service: General;  Laterality: N/A;   TONSILLECTOMY AND ADENOIDECTOMY  1953   Family History  Problem Relation Age of Onset   Cervical cancer Mother    Colon cancer Mother    Colon polyps Brother    Cervical cancer Maternal Grandmother    Diabetes Maternal Grandmother    Tuberculosis Maternal Grandmother    Lung cancer Maternal Grandfather    Cervical cancer Maternal Aunt    Social History   Socioeconomic History   Marital status: Married    Spouse name: Not on file   Number of children: 2   Years of education: College   Highest education level: Bachelor's degree (e.g., BA, AB, BS)  Occupational History    Employer: [employer]    Comment: retired  Tobacco Use   Smoking status: Every Day    Packs/day: 1.00    Years: 52.00    Total pack years: 52.00    Types: Cigarettes    Passive exposure: Past   Smokeless tobacco: Never   Tobacco comments:    Pt started smoking again. Is smoking 1.25-1.5 PPD  Vaping Use   Vaping Use: Never used  Substance and Sexual Activity   Alcohol use: Not Currently    Comment: Previous Heavy Alcohol Abuse, sober   Drug use: Not Currently   Sexual activity: Not on file  Other Topics Concern   Not on file  Social History Narrative   Not on file   Social Determinants of Health   Financial Resource Strain: Low Risk  (05/28/2022)   Overall Financial Resource Strain (CARDIA)    Difficulty of Paying Living Expenses: Not very hard  Food Insecurity: No Food Insecurity (05/28/2022)   Hunger Vital Sign    Worried About Running Out  of Food in the Last Year: Never true    Ran Out of Food in the Last Year: Never true  Transportation Needs: No Transportation Needs (05/28/2022)   PRAPARE - Hydrologist (Medical): No    Lack of Transportation (Non-Medical): No  Physical Activity: Insufficiently Active (05/28/2022)   Exercise Vital Sign    Days of Exercise per Week: 2 days    Minutes of Exercise per  Session: 20 min  Stress: No Stress Concern Present (05/28/2022)   [place]    Feeling of Stress : Only a little  Social Connections: Socially Integrated (05/28/2022)   Social Connection and Isolation Panel [NHANES]    Frequency of Communication with Friends and Family: Three times a week    Frequency of Social Gatherings with Friends and Family: More than three times a week    Attends Religious Services: More than 4 times per year    Active Member of [employer] or Organizations: Yes    Attends Music therapist: More than 4 times per year    Marital Status: Married    Tobacco Counseling Ready to quit: Not Answered Counseling given: Not Answered Tobacco comments: Pt started smoking again. Is smoking 1.25-1.5 PPD   Clinical Intake:  Pre-visit preparation completed: Yes  Pain : No/denies pain     Nutritional Risks: None Diabetes: Yes CBG done?: No Did pt. bring in CBG monitor from home?: No  How often do you need to have someone help you when you read instructions, pamphlets, or other written materials from your doctor or pharmacy?: 1 - Never  Diabetic?yes Nutrition Risk Assessment:  Has the patient had any N/V/D within the last 2 months?  No  Does the patient have any non-healing wounds?  No  Has the patient had any unintentional weight loss or weight gain?  No   Diabetes:  Is the patient diabetic?  Yes  If diabetic, was a CBG obtained today?  No  Did the patient bring in their glucometer from home?  No   How often do you monitor your CBG's? intermittently.   Financial Strains and Diabetes Management:  Are you having any financial strains with the device, your supplies or your medication? No .  Does the patient want to be seen by Chronic Care Management for management of their diabetes?  No  Would the patient like to be referred to a Nutritionist or for Diabetic Management?  No   Diabetic Exams:  Diabetic Eye Exam: Completed 05/02/22. Marland Kitchen Pt has been advised about the importance in completing this exam.  Diabetic Foot Exam: Completed 05/21/21. Pt has been advised about the importance in completing this exam.    Interpreter Needed?: No  Information entered by :: Kirke Shaggy, LPN   Activities of Daily Living    05/28/2022    9:54 AM 05/13/2022    6:23 AM  In your present state of health, do you have any difficulty performing the following activities:  Hearing? 0 0  Vision? 0 0  Difficulty concentrating or making decisions? 0 0  Walking or climbing stairs? 0 0  Dressing or bathing? 0 0  Doing errands, shopping? 0   Preparing Food and eating ? N   Using the Toilet? N   In the past six months, have you accidently leaked urine? N   Do you have problems with loss of bowel control? N   Managing your Medications? N   Managing your Finances? N   Housekeeping or managing your Housekeeping? N     Patient Care Team: Kate Sable, MD as PCP - General (Cardiology) Kate Sable, MD as PCP - Cardiology (Cardiology) Birder Robson, MD as Referring Physician (Ophthalmology) Ralene Bathe, MD (Dermatology)  Indicate any recent Medical Services you may have received from other than [facility] providers in the past year (date may be approximate).     Assessment:   This is  a routine wellness examination for [employer].  Hearing/Vision screen Hearing Screening - Comments:: No aids Vision Screening - Comments:: No glasses  Dietary issues and exercise activities discussed: Current  Exercise Habits: Home exercise routine, Type of exercise: walking, Time (Minutes): 20, Frequency (Times/Week): 2, Weekly Exercise (Minutes/Week): 40, Intensity: Mild   Goals Addressed             This Visit's Progress    DIET - INCREASE WATER INTAKE         Depression Screen    05/28/2022    9:50 AM 01/30/2022   11:50 AM 12/13/2021    2:58 PM 05/15/2021    3:54 PM 05/09/2020    9:54 AM 04/15/2019    2:51 PM 01/21/2018    1:49 PM  PHQ 2/9 Scores  PHQ - 2 Score 0 0 0 0 0 0 1  PHQ- 9 Score 0 0 0    1    Fall Risk    05/28/2022    9:54 AM 04/04/2022   11:08 AM 01/30/2022   11:50 AM 12/13/2021    2:58 PM 05/15/2021    4:00 PM  Fall Risk   Falls in the past year? 1 0 1 0 0  Number falls in past yr: 0  0 0 1  Injury with Fall? 0  0 0 0  Risk for fall due to : History of fall(s)  No Fall Risks  No Fall Risks  Follow up Falls evaluation completed;Falls prevention discussed  Falls evaluation completed  Falls evaluation completed    FALL RISK PREVENTION PERTAINING TO THE HOME:  Any stairs in or around the home? Yes  If so, are there any without handrails? No  Home free of loose throw rugs in walkways, pet beds, electrical cords, etc? Yes  Adequate lighting in your home to reduce risk of falls? Yes   ASSISTIVE DEVICES UTILIZED TO PREVENT FALLS:  Life alert? No  Use of a cane, walker or w/c? No  Grab bars in the bathroom? Yes  Shower chair or bench in shower? No  Elevated toilet seat or a handicapped toilet? No   TIMED UP AND GO:  Was the test performed? Yes .  Length of time to ambulate 10 feet: 4 sec.   Gait steady and fast without use of assistive device  Cognitive Function:        05/28/2022   10:01 AM 04/15/2019    2:56 PM 01/21/2018    1:54 PM  6CIT Screen  What Year? 0 points 0 points 0 points  What month? 0 points 0 points 0 points  What time? 0 points 0 points 0 points  Count back from 20 0 points 0 points 0 points  Months in reverse 0 points 0 points 0  points  Repeat phrase 2 points 0 points 0 points  Total Score 2 points 0 points 0 points    Immunizations Immunization History  Administered Date(s) Administered   Fluad Quad(high Dose 65+) 02/02/2019, 02/23/2020   Influenza Split 02/27/2012   Influenza, High Dose Seasonal PF 02/19/2016, 01/15/2017, 01/21/2018, 02/15/2021   Influenza,inj,Quad PF,6+ Mos 02/18/2014   Influenza-Unspecified 03/07/2015   PFIZER(Purple Top)SARS-COV-2 Vaccination 05/28/2019, 06/18/2019, 02/11/2020   Pfizer Covid-19 Vaccine Bivalent Booster 63yr & up 02/15/2021   Pneumococcal Conjugate-13 06/08/2014   Pneumococcal Polysaccharide-23 12/16/2011   Td 09/11/2004   Tetanus 10/04/2017   Zoster Recombinat (Shingrix) 07/18/2019   Zoster, Live 02/27/2012    TDAP status: Up to date  Flu Vaccine status: Up  to date  Pneumococcal vaccine status: Up to date  Covid-19 vaccine status: Completed vaccines  Qualifies for Shingles Vaccine? Yes   Zostavax completed Yes   Shingrix Completed?: No.    Education has been provided regarding the importance of this vaccine. Patient has been advised to call insurance company to determine out of pocket expense if they have not yet received this vaccine. Advised may also receive vaccine at local pharmacy or Health Dept. Verbalized acceptance and understanding.  Screening Tests Health Maintenance  Topic Date Due   Zoster Vaccines- Shingrix (2 of 2) 09/12/2019   INFLUENZA VACCINE  12/04/2021   COVID-19 Vaccine (5 - 2023-24 season) 01/04/2022   Diabetic kidney evaluation - Urine ACR  05/21/2022   FOOT EXAM  05/21/2022   HEMOGLOBIN A1C  06/15/2022   Lung Cancer Screening  02/19/2023   Diabetic kidney evaluation - eGFR measurement  04/16/2023   OPHTHALMOLOGY EXAM  05/03/2023   Medicare Annual Wellness (AWV)  05/29/2023   COLONOSCOPY (Pts 45-41yr Insurance coverage will need to be confirmed)  10/31/2023   DTaP/Tdap/Td (3 - Tdap) 10/05/2027   Pneumonia Vaccine 77 Years old   Completed   Hepatitis C Screening  Completed   HPV VACCINES  Aged Out    Health Maintenance  Health Maintenance Due  Topic Date Due   Zoster Vaccines- Shingrix (2 of 2) 09/12/2019   INFLUENZA VACCINE  12/04/2021   COVID-19 Vaccine (5 - 2023-24 season) 01/04/2022   Diabetic kidney evaluation - Urine ACR  05/21/2022   FOOT EXAM  05/21/2022    Colorectal cancer screening: Type of screening: Colonoscopy. Completed 10/30/20. Repeat every 3 years  Lung Cancer Screening: (Low Dose CT Chest recommended if Age 78-80years, 30 pack-year currently smoking OR have quit w/in 15years.) does not qualify.   Lung Cancer Screening Referral: goes every year to get it  Additional Screening:  Hepatitis C Screening: does qualify; Completed 12/20/11  Vision Screening: Recommended annual ophthalmology exams for early detection of glaucoma and other disorders of the eye. Is the patient up to date with their annual eye exam?  Yes  Who is the provider or what is the name of the office in which the patient attends annual [place] exams[place] established with a provider, would they like to be referred to a provider to establish care? No .   Dental Screening: Recommended annual dental exams for proper oral hygiene  Community Resource Referral / Chronic Care Management: CRR required this visit?  No   CCM required this visit?  No      Plan:     I have personally reviewed and noted the following in the patient's chart:   Medical and social history Use of alcohol, tobacco or illicit drugs  Current medications and supplements including opioid prescriptions. Patient is not currently taking opioid prescriptions. Functional ability and status Nutritional status Physical activity Advanced directives List of other physicians Hospitalizations, surgeries, and ER visits in previous 12 months Vitals Screenings to include cognitive, depression, and falls Referrals and appointments  In  addition, I have reviewed and discussed with patient certain preventive protocols, quality metrics, and best practice recommendations. A written personalized care plan for preventive services as well as general preventive health recommendations were provided to patient.     LDionisio David LPN   [id number]  Nurse Notes: none

## 2022-05-28 NOTE — Progress Notes (Signed)
Patient came in today for a wound check. Packing removed. The wound bed is clean, with no signs of infection noted. Sough noted on packing. Area repacked with 1/2 inch plain packing. Follow up as scheduled.

## 2022-05-28 NOTE — Patient Instructions (Signed)
[name] , Thank you for taking time to come for your Medicare Wellness Visit. I appreciate your ongoing commitment to your health goals. Please review the following plan we discussed and let me know if I can assist you in the future.   These are the goals we discussed:  Goals      DIET - EAT MORE FRUITS AND VEGETABLES     DIET - INCREASE WATER INTAKE     DIET - REDUCE CALORIE INTAKE     Recommend to continue current diet plan of cutting out sodas and junk food to help aid in weight loss.      Exercise 3x per week (30 min per time)     Recommend to exercise for 3 days a week for at least 30 minutes at a time.      Quit Smoking     Recommend to continue efforts to reduce smoking habits until no longer smoking.         This is a list of the screening recommended for you and due dates:  Health Maintenance  Topic Date Due   Zoster (Shingles) Vaccine (2 of 2) 09/12/2019   Flu Shot  12/04/2021   COVID-19 Vaccine (5 - 2023-24 season) 01/04/2022   Yearly kidney health urinalysis for diabetes  05/21/2022   Complete foot exam   05/21/2022   Hemoglobin A1C  06/15/2022   Screening for Lung Cancer  02/19/2023   Yearly kidney function blood test for diabetes  04/16/2023   Eye exam for diabetics  05/03/2023   Medicare Annual Wellness Visit  05/29/2023   Colon Cancer Screening  10/31/2023   DTaP/Tdap/Td vaccine (3 - Tdap) 10/05/2027   Pneumonia Vaccine  Completed   Hepatitis C Screening: USPSTF Recommendation to screen - Ages 24-79 yo.  Completed   HPV Vaccine  Aged Out    Advanced directives: no  Conditions/risks identified: none  Next appointment: Follow up in one year for your annual wellness visit. 06/02/23 @ 9:00 am in person  Preventive Care 65 Years and Older, Male  Preventive care refers to lifestyle choices and visits with your health care provider that can promote health and wellness. What does preventive care include? A yearly physical exam. This is also called an  annual well check. Dental exams once or twice a year. Routine eye exams. Ask your health care provider how often you should have your eyes checked. Personal lifestyle choices, including: Daily care of your teeth and gums. Regular physical activity. Eating a healthy diet. Avoiding tobacco and drug use. Limiting alcohol use. Practicing safe sex. Taking low doses of aspirin every day. Taking vitamin and mineral supplements as recommended by your health care provider. What happens during an annual well check? The services and screenings done by your health care provider during your annual well check will depend on your age, overall health, lifestyle risk factors, and family history of disease. Counseling  Your health care provider may ask you questions about your: Alcohol use. Tobacco use. Drug use. Emotional well-being. Home and relationship well-being. Sexual activity. Eating habits. History of falls. Memory and ability to understand (cognition). Work and work Statistician. Screening  You may have the following tests or measurements: Height, weight, and BMI. Blood pressure. Lipid and cholesterol levels. These may be checked every 5 years, or more frequently if you are over 49 years old. Skin check. Lung cancer screening. You may have this screening every year starting at age 24 if you have a 30-pack-year history of  smoking and currently smoke or have quit within the past 15 years. Fecal occult blood test (FOBT) of the stool. You may have this test every year starting at age 61. Flexible sigmoidoscopy or colonoscopy. You may have a sigmoidoscopy every 5 years or a colonoscopy every 10 years starting at age 52. Prostate cancer screening. Recommendations will vary depending on your family history and other risks. Hepatitis C blood test. Hepatitis B blood test. Sexually transmitted disease (STD) testing. Diabetes screening. This is done by checking your blood sugar (glucose) after you  have not eaten for a while (fasting). You may have this done every 1-3 years. Abdominal aortic aneurysm (AAA) screening. You may need this if you are a current or former smoker. Osteoporosis. You may be screened starting at age 78 if you are at high risk. Talk with your health care provider about your test results, treatment options, and if necessary, the need for more tests. Vaccines  Your health care provider may recommend certain vaccines, such as: Influenza vaccine. This is recommended every year. Tetanus, diphtheria, and acellular pertussis (Tdap, Td) vaccine. You may need a Td booster every 10 years. Zoster vaccine. You may need this after age 54. Pneumococcal 13-valent conjugate (PCV13) vaccine. One dose is recommended after age 18. Pneumococcal polysaccharide (PPSV23) vaccine. One dose is recommended after age 57. Talk to your health care provider about which screenings and vaccines you need and how often you need them. This information is not intended to replace advice given to you by your health care provider. Make sure you discuss any questions you have with your health care provider. Document Released: 05/19/2015 Document Revised: 01/10/2016 Document Reviewed: 02/21/2015 Elsevier Interactive Patient Education  2017 [employer] Prevention in the Home Falls can cause injuries. They can happen to people of all ages. There are many things you can do to make your home safe and to help prevent falls. What can I do on the outside of my home? Regularly fix the edges of walkways and driveways and fix any cracks. Remove anything that might make you trip as you walk through a door, such as a raised step or threshold. Trim any bushes or trees on the path to your home. Use bright outdoor lighting. Clear any walking paths of anything that might make someone trip, such as rocks or tools. Regularly check to see if handrails are loose or broken. Make sure that both sides of any steps have  handrails. Any raised decks and porches should have guardrails on the edges. Have any leaves, snow, or ice cleared regularly. Use sand or salt on walking paths during winter. Clean up any spills in your garage right away. This includes oil or grease spills. What can I do in the bathroom? Use night lights. Install grab bars by the toilet and in the tub and shower. Do not use towel bars as grab bars. Use non-skid mats or decals in the tub or shower. If you need to sit down in the shower, use a plastic, non-slip stool. Keep the floor dry. Clean up any water that spills on the floor as soon as it happens. Remove soap buildup in the tub or shower regularly. Attach bath mats securely with double-sided non-slip rug tape. Do not have throw rugs and other things on the floor that can make you trip. What can I do in the bedroom? Use night lights. Make sure that you have a light by your bed that is easy to reach. Do not use  any sheets or blankets that are too big for your bed. They should not hang down onto the floor. Have a firm chair that has side arms. You can use this for support while you get dressed. Do not have throw rugs and other things on the floor that can make you trip. What can I do in the kitchen? Clean up any spills right away. Avoid walking on wet floors. Keep items that you use a lot in easy-to-reach places. If you need to reach something above you, use a strong step stool that has a grab bar. Keep electrical cords out of the way. Do not use floor polish or wax that makes floors slippery. If you must use wax, use non-skid floor wax. Do not have throw rugs and other things on the floor that can make you trip. What can I do with my stairs? Do not leave any items on the stairs. Make sure that there are handrails on both sides of the stairs and use them. Fix handrails that are broken or loose. Make sure that handrails are as long as the stairways. Check any carpeting to make sure  that it is firmly attached to the stairs. Fix any carpet that is loose or worn. Avoid having throw rugs at the top or bottom of the stairs. If you do have throw rugs, attach them to the floor with carpet tape. Make sure that you have a light switch at the top of the stairs and the bottom of the stairs. If you do not have them, ask someone to add them for you. What else can I do to help prevent falls? Wear shoes that: Do not have high heels. Have rubber bottoms. Are comfortable and fit you well. Are closed at the toe. Do not wear sandals. If you use a stepladder: Make sure that it is fully opened. Do not climb a closed stepladder. Make sure that both sides of the stepladder are locked into place. Ask someone to hold it for you, if possible. Clearly mark and make sure that you can see: Any grab bars or handrails. First and last steps. Where the edge of each step is. Use tools that help you move around (mobility aids) if they are needed. These include: Canes. Walkers. Scooters. Crutches. Turn on the lights when you go into a dark area. Replace any light bulbs as soon as they burn out. Set up your furniture so you have a clear path. Avoid moving your furniture around. If any of your floors are uneven, fix them. If there are any pets around you, be aware of where they are. Review your medicines with your doctor. Some medicines can make you feel dizzy. This can increase your chance of falling. Ask your doctor what other things that you can do to help prevent falls. This information is not intended to replace advice given to you by your health care provider. Make sure you discuss any questions you have with your health care provider. Document Released: 02/16/2009 Document Revised: 09/28/2015 Document Reviewed: 05/27/2014 Elsevier Interactive Patient Education  2017 [employer].

## 2022-05-29 ENCOUNTER — Ambulatory Visit (INDEPENDENT_AMBULATORY_CARE_PROVIDER_SITE_OTHER): Payer: Medicare HMO

## 2022-05-29 DIAGNOSIS — L0591 Pilonidal cyst without abscess: Secondary | ICD-10-CM

## 2022-05-29 NOTE — Progress Notes (Signed)
Patient came to office today after showering-wound looks good-minimal drainage. Yeast rash present-however was treated with oral fluconazole. Packed the wound using 1/2 inch plain -covered with dry dressing and secured with tape.

## 2022-05-30 ENCOUNTER — Ambulatory Visit (INDEPENDENT_AMBULATORY_CARE_PROVIDER_SITE_OTHER): Payer: Medicare HMO

## 2022-05-30 DIAGNOSIS — L0591 Pilonidal cyst without abscess: Secondary | ICD-10-CM

## 2022-05-30 NOTE — Progress Notes (Signed)
Patient came to clinic today for wound packing.  Removed old packing strip- wound looks good- minimal drainage. Please new packing into wound and applied dry dressing and secured with tape. Denies pain/fever.

## 2022-05-31 ENCOUNTER — Ambulatory Visit (INDEPENDENT_AMBULATORY_CARE_PROVIDER_SITE_OTHER): Payer: Medicare HMO

## 2022-05-31 DIAGNOSIS — L0591 Pilonidal cyst without abscess: Secondary | ICD-10-CM

## 2022-06-03 ENCOUNTER — Ambulatory Visit: Payer: Medicare HMO

## 2022-06-03 NOTE — Progress Notes (Unsigned)
Patient came today for daily wound check and packing. Placed 1/2 inch packing into the wound bed. Applied dry dressing over the area and secured with tape. Wound is decreasing in size. Minimal drainage seen.

## 2022-06-03 NOTE — Progress Notes (Unsigned)
Patient came to office today for wound packing. Wound looks good. No pus drainage noted. Packed 1/2 strip into wound bed and covered with dry dressing.

## 2022-06-04 ENCOUNTER — Ambulatory Visit (INDEPENDENT_AMBULATORY_CARE_PROVIDER_SITE_OTHER): Payer: Medicare HMO

## 2022-06-04 MED ORDER — NYSTATIN-TRIAMCINOLONE 100000-0.1 UNIT/GM-% EX OINT: 1.0000 | TOPICAL_OINTMENT | Freq: Two times a day (BID) | CUTANEOUS | 0 refills | Status: DC

## 2022-06-04 NOTE — Progress Notes (Unsigned)
Patient came to office today for wound check. Packing removed prior to showering. Wound looks good, decreasing in size. Patient complained of itching. Dr.Rodenberg has prescribed Nystatin cream and patient it to bring to clinic daily to apply along the outer edges of the wound. Packing placed into the wound bed and dry dressing placed over the area and secured with tape.

## 2022-06-05 ENCOUNTER — Ambulatory Visit (INDEPENDENT_AMBULATORY_CARE_PROVIDER_SITE_OTHER): Payer: Medicare HMO

## 2022-06-05 DIAGNOSIS — L0591 Pilonidal cyst without abscess: Secondary | ICD-10-CM

## 2022-06-05 NOTE — Progress Notes (Signed)
Patient came today for wound check and wound packing. Placed packing 1/2 inch strip into wound bed. Applied Triamcinolone ointment to the surrounding edges of the wound. Place dry gauze over the area and secured with tape.wound have very little drainage. Look good. No redness.

## 2022-06-06 ENCOUNTER — Ambulatory Visit (INDEPENDENT_AMBULATORY_CARE_PROVIDER_SITE_OTHER): Payer: Medicare HMO | Admitting: Surgery

## 2022-06-06 ENCOUNTER — Encounter: Payer: Self-pay | Admitting: Surgery

## 2022-06-06 VITALS — BP 112/72 | HR 79 | Temp 98.0°F | Ht 67.0 in | Wt 200.0 lb

## 2022-06-06 DIAGNOSIS — L0591 Pilonidal cyst without abscess: Secondary | ICD-10-CM

## 2022-06-06 DIAGNOSIS — Z09 Encounter for follow-up examination after completed treatment for conditions other than malignant neoplasm: Secondary | ICD-10-CM

## 2022-06-06 NOTE — Patient Instructions (Signed)
Follow up as schedule.

## 2022-06-06 NOTE — Progress Notes (Signed)
Pilonidal wound, with well granulated soft tissues.  Serous drainage noted, mucousy drainage has diminished/resolved.  No evidence of erythema, induration or infection. Will continue daily wound care, follow-up biweekly.

## 2022-06-07 ENCOUNTER — Ambulatory Visit (INDEPENDENT_AMBULATORY_CARE_PROVIDER_SITE_OTHER): Payer: Medicare HMO

## 2022-06-07 DIAGNOSIS — L0591 Pilonidal cyst without abscess: Secondary | ICD-10-CM

## 2022-06-07 NOTE — Progress Notes (Signed)
Patient ID: David [name]., male   DOB: 12-13-1945, 77 y.o.   MRN: [id number] Patient came in today for a wound check. Wound unpacked. The wound is clean, with no signs of infection noted. Rash is improved and starting to dry. Wound repacked with 1/2 inch packing and top gauze. Follow up as scheduled.

## 2022-06-10 ENCOUNTER — Ambulatory Visit: Payer: Medicare HMO

## 2022-06-11 ENCOUNTER — Ambulatory Visit (INDEPENDENT_AMBULATORY_CARE_PROVIDER_SITE_OTHER): Payer: Medicare HMO

## 2022-06-11 DIAGNOSIS — L0591 Pilonidal cyst without abscess: Secondary | ICD-10-CM

## 2022-06-11 NOTE — Progress Notes (Signed)
Patient returns to clinic today for wound packing. No redness, minimal drainage. Placed about 2 inch lon 1/2 inch packing strip into wound bed. Placed dry dressing over the area and secured with tape. Wound is healing and taking less packing every few days. Patient to return tomorrow for wound care.

## 2022-06-12 ENCOUNTER — Ambulatory Visit (INDEPENDENT_AMBULATORY_CARE_PROVIDER_SITE_OTHER): Payer: Medicare HMO

## 2022-06-12 DIAGNOSIS — L0591 Pilonidal cyst without abscess: Secondary | ICD-10-CM

## 2022-06-12 NOTE — Progress Notes (Signed)
Patient ID: David Mutter., male   DOB: [birth year], 77 y.o.   MRN: [id number] Patient came in today for a wound check. Wound unpacked, red beefy tissue in wound bed. Area repacked with 1/2 inch pacing and covered with gauze. The wound is clean, with no signs of infection noted. Follow up as scheduled.

## 2022-06-13 ENCOUNTER — Ambulatory Visit (INDEPENDENT_AMBULATORY_CARE_PROVIDER_SITE_OTHER): Payer: Medicare HMO

## 2022-06-13 DIAGNOSIS — L0591 Pilonidal cyst without abscess: Secondary | ICD-10-CM

## 2022-06-13 NOTE — Patient Instructions (Signed)
Follow up as schedule.

## 2022-06-13 NOTE — Progress Notes (Signed)
Patient ID: David [name]., male   DOB: 1945-12-27, 77 y.o.   MRN: [id number] Patient came in today for a wound check. Packing removed and wound is clean. Very little undermining at the top of the wound. Packing changed to 1/4 inch plain packing.The wound is clean, with no signs of infection noted. Follow up as scheduled.

## 2022-06-14 ENCOUNTER — Ambulatory Visit (INDEPENDENT_AMBULATORY_CARE_PROVIDER_SITE_OTHER): Payer: Medicare HMO

## 2022-06-14 DIAGNOSIS — Z09 Encounter for follow-up examination after completed treatment for conditions other than malignant neoplasm: Secondary | ICD-10-CM

## 2022-06-14 DIAGNOSIS — L0591 Pilonidal cyst without abscess: Secondary | ICD-10-CM

## 2022-06-14 NOTE — Progress Notes (Signed)
Patient come to office today for wound check and packing.  1/4 inch packing placed. Nystatin /Triamcinolone cream applied to outer edges. Dry gauze dressing placed and secured with tape. Patient to return on Monday. Wound is smaller and minimal drainage. Looks good.

## 2022-06-17 ENCOUNTER — Ambulatory Visit (INDEPENDENT_AMBULATORY_CARE_PROVIDER_SITE_OTHER): Payer: Medicare HMO

## 2022-06-17 DIAGNOSIS — L0591 Pilonidal cyst without abscess: Secondary | ICD-10-CM

## 2022-06-17 DIAGNOSIS — Z09 Encounter for follow-up examination after completed treatment for conditions other than malignant neoplasm: Secondary | ICD-10-CM

## 2022-06-17 NOTE — Progress Notes (Signed)
Patient ID: David Mutter., male   DOB: 1946-04-19, 77 y.o.   MRN: [id number] Patient came in today for a wound check.  The wound is clean, with no signs of infection noted. Area repacked and gauze taped in place. Follow up as scheduled.

## 2022-06-17 NOTE — Patient Instructions (Signed)
Follow up as scheduled.  

## 2022-06-18 ENCOUNTER — Ambulatory Visit (INDEPENDENT_AMBULATORY_CARE_PROVIDER_SITE_OTHER): Payer: Medicare HMO

## 2022-06-18 DIAGNOSIS — Z09 Encounter for follow-up examination after completed treatment for conditions other than malignant neoplasm: Secondary | ICD-10-CM

## 2022-06-18 DIAGNOSIS — L0591 Pilonidal cyst without abscess: Secondary | ICD-10-CM

## 2022-06-18 NOTE — Progress Notes (Signed)
Patient ID: [name]., male   DOB: [birth year], 77 y.o.   MRN: [id number] Patient came in today for a wound check. The wound is clean, with no signs of infection noted. Very small area of undermining at the top of the wound. 1/4 inch packing used to pack wound and top gauze taped in place. Follow up as scheduled.

## 2022-06-19 ENCOUNTER — Ambulatory Visit (INDEPENDENT_AMBULATORY_CARE_PROVIDER_SITE_OTHER): Payer: Medicare HMO

## 2022-06-19 DIAGNOSIS — L0591 Pilonidal cyst without abscess: Secondary | ICD-10-CM

## 2022-06-19 NOTE — Patient Instructions (Signed)
Follow up as scheduled.  

## 2022-06-19 NOTE — Progress Notes (Signed)
Patient came in today for a wound check.  The wound is clean, with no signs of infection noted. Wound repacked with 1/4 inch plain packing. Follow up as scheduled.

## 2022-06-20 ENCOUNTER — Ambulatory Visit (INDEPENDENT_AMBULATORY_CARE_PROVIDER_SITE_OTHER): Payer: Medicare HMO

## 2022-06-20 DIAGNOSIS — L0591 Pilonidal cyst without abscess: Secondary | ICD-10-CM

## 2022-06-20 NOTE — Progress Notes (Signed)
Patient ID: David [name]., male   DOB: 06/13/1945, 76 y.o.   MRN: [id number] Patient came in today for a wound check. The wound is clean, with no signs of infection noted. Packing placed and gauze dressing applied. Follow up as scheduled.

## 2022-06-21 ENCOUNTER — Ambulatory Visit (INDEPENDENT_AMBULATORY_CARE_PROVIDER_SITE_OTHER): Payer: Medicare HMO

## 2022-06-21 DIAGNOSIS — Z09 Encounter for follow-up examination after completed treatment for conditions other than malignant neoplasm: Secondary | ICD-10-CM

## 2022-06-21 DIAGNOSIS — L0591 Pilonidal cyst without abscess: Secondary | ICD-10-CM

## 2022-06-21 NOTE — Progress Notes (Signed)
Pt came in for his daily packing in the pilonidal area. Used packing strips, gauze and tape.

## 2022-06-24 ENCOUNTER — Ambulatory Visit (INDEPENDENT_AMBULATORY_CARE_PROVIDER_SITE_OTHER): Payer: Medicare HMO

## 2022-06-24 DIAGNOSIS — Z09 Encounter for follow-up examination after completed treatment for conditions other than malignant neoplasm: Secondary | ICD-10-CM

## 2022-06-24 DIAGNOSIS — L0591 Pilonidal cyst without abscess: Secondary | ICD-10-CM

## 2022-06-24 NOTE — Progress Notes (Signed)
Pt came in with wound packing. Packed 1/4 inch into wound. Pt tolerated well.

## 2022-06-25 ENCOUNTER — Encounter: Payer: Self-pay | Admitting: Surgery

## 2022-06-25 ENCOUNTER — Ambulatory Visit (INDEPENDENT_AMBULATORY_CARE_PROVIDER_SITE_OTHER): Payer: Medicare HMO | Admitting: Surgery

## 2022-06-25 ENCOUNTER — Other Ambulatory Visit: Payer: Self-pay

## 2022-06-25 VITALS — BP 130/72 | HR 71 | Temp 98.6°F | Ht 67.0 in | Wt 200.0 lb

## 2022-06-25 DIAGNOSIS — Z09 Encounter for follow-up examination after completed treatment for conditions other than malignant neoplasm: Secondary | ICD-10-CM

## 2022-06-25 DIAGNOSIS — L0591 Pilonidal cyst without abscess: Secondary | ICD-10-CM | POA: Diagnosis not present

## 2022-06-25 NOTE — Patient Instructions (Signed)
No need for packing at this time. Continue to wash the area everyday and place a dry gauze over the area. Please call if you have any questions or concerns.

## 2022-06-25 NOTE — Progress Notes (Signed)
[name] returns today for follow-up of his pilonidal wound.  He has minimal residual nylon epidermal covered area of granulation in his upper natal cleft.  It is clean, well granulated.  He has evidence of surrounding ingrowth of epidermis along the walls of the cleft wound.  He has no significant drainage.  I have utilized a Q-tip to explore the wound to ensure there there is no premature healing present.  There is no evidence of proud flesh.  He has just been covering it with a 2 x 2 gauze which I think is adequate.  Advised that he continue to probe it with a Q-tip on a regular basis to discourage any deep premature healing.  And expectant that he will continue to progress onto complete healing with time.  He may follow-up with [place] as needed.

## 2022-06-29 DIAGNOSIS — G4733 Obstructive sleep apnea (adult) (pediatric): Secondary | ICD-10-CM | POA: Diagnosis not present

## 2022-07-01 ENCOUNTER — Telehealth: Payer: Self-pay | Admitting: Family Medicine

## 2022-07-01 DIAGNOSIS — E669 Obesity, unspecified: Secondary | ICD-10-CM

## 2022-07-01 MED ORDER — METFORMIN HCL 500 MG PO TABS: 500.0000 mg | ORAL_TABLET | Freq: Every day | ORAL | 0 refills | Status: DC

## 2022-07-01 MED ORDER — TAMSULOSIN HCL 0.4 MG PO CAPS: 0.4000 mg | ORAL_CAPSULE | Freq: Every day | ORAL | 1 refills | Status: DC

## 2022-07-01 MED ORDER — ATORVASTATIN CALCIUM 40 MG PO TABS: 40.0000 mg | ORAL_TABLET | Freq: Every day | ORAL | 1 refills | Status: DC

## 2022-07-01 MED ORDER — LISINOPRIL 5 MG PO TABS: 5.0000 mg | ORAL_TABLET | Freq: Every day | ORAL | 1 refills | Status: DC

## 2022-07-01 NOTE — Telephone Encounter (Signed)
[facility] faxed refill request for the following medications:  atorvastatin (LIPITOR) 40 MG tablet     metFORMIN (GLUCOPHAGE) 500 MG tablet    lisinopril (ZESTRIL) 5 MG tablet    tamsulosin (FLOMAX) 0.4 MG CAPS capsule   Please advise

## 2022-07-29 ENCOUNTER — Ambulatory Visit
Admission: RE | Admit: 2022-07-29 | Discharge: 2022-07-29 | Disposition: A | Discharge status: Home / Self Care | Payer: Medicare HMO | Source: Ambulatory Visit | Attending: Family Medicine | Admitting: Family Medicine

## 2022-07-29 DIAGNOSIS — Z87891 Personal history of nicotine dependence: Secondary | ICD-10-CM | POA: Diagnosis not present

## 2022-07-29 DIAGNOSIS — F17210 Nicotine dependence, cigarettes, uncomplicated: Secondary | ICD-10-CM | POA: Insufficient documentation

## 2022-07-29 DIAGNOSIS — R69 Illness, unspecified: Secondary | ICD-10-CM | POA: Diagnosis not present

## 2022-08-09 ENCOUNTER — Encounter: Payer: Self-pay | Admitting: Family Medicine

## 2022-08-30 DIAGNOSIS — N529 Male erectile dysfunction, unspecified: Secondary | ICD-10-CM | POA: Diagnosis not present

## 2022-08-30 DIAGNOSIS — E1136 Type 2 diabetes mellitus with diabetic cataract: Secondary | ICD-10-CM | POA: Diagnosis not present

## 2022-08-30 DIAGNOSIS — N189 Chronic kidney disease, unspecified: Secondary | ICD-10-CM | POA: Diagnosis not present

## 2022-08-30 DIAGNOSIS — F1021 Alcohol dependence, in remission: Secondary | ICD-10-CM | POA: Diagnosis not present

## 2022-08-30 DIAGNOSIS — J449 Chronic obstructive pulmonary disease, unspecified: Secondary | ICD-10-CM | POA: Diagnosis not present

## 2022-08-30 DIAGNOSIS — I2510 Atherosclerotic heart disease of native coronary artery without angina pectoris: Secondary | ICD-10-CM | POA: Diagnosis not present

## 2022-08-30 DIAGNOSIS — E669 Obesity, unspecified: Secondary | ICD-10-CM | POA: Diagnosis not present

## 2022-08-30 DIAGNOSIS — E785 Hyperlipidemia, unspecified: Secondary | ICD-10-CM | POA: Diagnosis not present

## 2022-08-30 DIAGNOSIS — E1122 Type 2 diabetes mellitus with diabetic chronic kidney disease: Secondary | ICD-10-CM | POA: Diagnosis not present

## 2022-08-30 DIAGNOSIS — Z809 Family history of malignant neoplasm, unspecified: Secondary | ICD-10-CM | POA: Diagnosis not present

## 2022-08-30 DIAGNOSIS — G8929 Other chronic pain: Secondary | ICD-10-CM | POA: Diagnosis not present

## 2022-08-30 DIAGNOSIS — J309 Allergic rhinitis, unspecified: Secondary | ICD-10-CM | POA: Diagnosis not present

## 2022-09-02 DIAGNOSIS — R69 Illness, unspecified: Secondary | ICD-10-CM | POA: Diagnosis not present

## 2022-09-27 ENCOUNTER — Encounter: Payer: Self-pay | Admitting: Family Medicine

## 2022-09-27 DIAGNOSIS — E669 Obesity, unspecified: Secondary | ICD-10-CM

## 2022-09-27 DIAGNOSIS — G4733 Obstructive sleep apnea (adult) (pediatric): Secondary | ICD-10-CM | POA: Diagnosis not present

## 2022-09-27 MED ORDER — TAMSULOSIN HCL 0.4 MG PO CAPS: 0.4000 mg | ORAL_CAPSULE | Freq: Every day | ORAL | 0 refills | Status: DC

## 2022-09-27 MED ORDER — LISINOPRIL 5 MG PO TABS: 5.0000 mg | ORAL_TABLET | Freq: Every day | ORAL | 0 refills | Status: DC

## 2022-09-27 MED ORDER — METFORMIN HCL 500 MG PO TABS: 500.0000 mg | ORAL_TABLET | Freq: Every day | ORAL | 0 refills | Status: DC

## 2022-09-27 MED ORDER — FLUTICASONE PROPIONATE 50 MCG/ACT NA SUSP: 2.0000 | Freq: Every day | NASAL | 0 refills | Status: AC

## 2022-09-27 MED ORDER — ATORVASTATIN CALCIUM 40 MG PO TABS: 40.0000 mg | ORAL_TABLET | Freq: Every day | ORAL | 0 refills | Status: DC

## 2022-10-15 DIAGNOSIS — K080 Exfoliation of teeth due to systemic causes: Secondary | ICD-10-CM | POA: Diagnosis not present

## 2022-10-22 DIAGNOSIS — M79671 Pain in right foot: Secondary | ICD-10-CM | POA: Diagnosis not present

## 2022-10-28 DIAGNOSIS — G4733 Obstructive sleep apnea (adult) (pediatric): Secondary | ICD-10-CM | POA: Diagnosis not present

## 2022-11-12 ENCOUNTER — Ambulatory Visit: Payer: Medicare Other | Admitting: Family Medicine

## 2022-11-27 DIAGNOSIS — G4733 Obstructive sleep apnea (adult) (pediatric): Secondary | ICD-10-CM | POA: Diagnosis not present

## 2022-12-19 DIAGNOSIS — Z800 Family history of malignant neoplasm of digestive organs: Secondary | ICD-10-CM | POA: Diagnosis not present

## 2022-12-19 DIAGNOSIS — Z8052 Family history of malignant neoplasm of bladder: Secondary | ICD-10-CM | POA: Diagnosis not present

## 2022-12-19 DIAGNOSIS — Z86010 Personal history of colonic polyps: Secondary | ICD-10-CM | POA: Diagnosis not present

## 2022-12-19 DIAGNOSIS — Z148 Genetic carrier of other disease: Secondary | ICD-10-CM | POA: Diagnosis not present

## 2022-12-23 ENCOUNTER — Ambulatory Visit (INDEPENDENT_AMBULATORY_CARE_PROVIDER_SITE_OTHER): Payer: Medicare Other | Admitting: Family Medicine

## 2022-12-23 ENCOUNTER — Encounter: Payer: Self-pay | Admitting: Family Medicine

## 2022-12-23 VITALS — BP 129/55 | HR 67 | Temp 97.8°F | Resp 16 | Ht 67.0 in | Wt 208.2 lb

## 2022-12-23 DIAGNOSIS — E1129 Type 2 diabetes mellitus with other diabetic kidney complication: Secondary | ICD-10-CM

## 2022-12-23 DIAGNOSIS — N400 Enlarged prostate without lower urinary tract symptoms: Secondary | ICD-10-CM

## 2022-12-23 DIAGNOSIS — E1169 Type 2 diabetes mellitus with other specified complication: Secondary | ICD-10-CM

## 2022-12-23 DIAGNOSIS — R809 Proteinuria, unspecified: Secondary | ICD-10-CM

## 2022-12-23 DIAGNOSIS — Z800 Family history of malignant neoplasm of digestive organs: Secondary | ICD-10-CM | POA: Diagnosis not present

## 2022-12-23 DIAGNOSIS — Z1501 Genetic susceptibility to malignant neoplasm of breast: Secondary | ICD-10-CM

## 2022-12-23 DIAGNOSIS — Z0000 Encounter for general adult medical examination without abnormal findings: Secondary | ICD-10-CM | POA: Diagnosis not present

## 2022-12-23 DIAGNOSIS — D692 Other nonthrombocytopenic purpura: Secondary | ICD-10-CM

## 2022-12-23 DIAGNOSIS — J449 Chronic obstructive pulmonary disease, unspecified: Secondary | ICD-10-CM

## 2022-12-23 DIAGNOSIS — Z148 Genetic carrier of other disease: Secondary | ICD-10-CM | POA: Diagnosis not present

## 2022-12-23 DIAGNOSIS — E785 Hyperlipidemia, unspecified: Secondary | ICD-10-CM | POA: Diagnosis not present

## 2022-12-23 DIAGNOSIS — Z1509 Genetic susceptibility to other malignant neoplasm: Secondary | ICD-10-CM

## 2022-12-23 MED ORDER — ONETOUCH ULTRA 2 W/DEVICE KIT
PACK | 0 refills | Status: AC
Start: 2022-12-23 — End: ?

## 2022-12-23 MED ORDER — ONETOUCH ULTRA TEST VI STRP
ORAL_STRIP | 12 refills | Status: AC
Start: 2022-12-23 — End: ?

## 2022-12-23 MED ORDER — ONETOUCH DELICA LANCETS 33G MISC
1 refills | Status: AC
Start: 2022-12-23 — End: ?

## 2022-12-23 NOTE — Assessment & Plan Note (Signed)
Stable

## 2022-12-23 NOTE — Assessment & Plan Note (Signed)
Encourage regular cancer screenings

## 2022-12-23 NOTE — Assessment & Plan Note (Signed)
Chronic and stable  Continue Flomax  Recheck PSA

## 2022-12-23 NOTE — Assessment & Plan Note (Signed)
Chronic and stable  No medications required Encourage tobacco cessation

## 2022-12-23 NOTE — Assessment & Plan Note (Addendum)
Chronic, relatively well controlled  Last lipid panel significant for low HDL and elevated triglycerides  Continue Lipitor  Recheck lipid panel  Recheck CMP

## 2022-12-23 NOTE — Assessment & Plan Note (Addendum)
Chronic, well controlled  Most recent A1c was 6.6 (a year ago) Continue metformin 500 mg at bedtime  Recheck A1c  Recheck U microalbumin Cr ratio  Foot exam completed  Glucose monitoring strips and meter prescribed

## 2022-12-23 NOTE — Assessment & Plan Note (Signed)
Chronic and stable  Recheck uACR Continue low dose lisinopril

## 2022-12-23 NOTE — Progress Notes (Signed)
Complete physical exam  Patient: David [name].   DOB: 26-Feb-1946   77 y.o. Male  MRN: [id number]  Subjective:    Chief Complaint  Patient presents with   Annual Exam    David [name]. is a 77 y.o. male who presents today for a complete physical exam. He reports consuming a general diet. Home exercise routine includes yardwork. He generally feels well. He reports sleeping well. He does have additional problems to discuss today.    Most recent fall risk assessment:    12/23/2022   10:29 AM  Fall Risk   Falls in the past year? 0  Number falls in past yr: 0  Injury with Fall? 0  Risk for fall due to : No Fall Risks  Follow up Falls evaluation completed     Most recent depression screenings:    12/23/2022   10:31 AM 05/28/2022    9:50 AM  PHQ 2/9 Scores  PHQ - 2 Score 0 0  PHQ- 9 Score 1 0    Patient Active Problem List   Diagnosis Date Noted   Other nonthrombocytopenic purpura (HCC) 12/23/2022   PMS2 gene mutation positive 12/23/2022   Pilonidal cyst 04/04/2022   Annual physical exam 12/13/2021   Diabetes mellitus type 2 in obese 12/13/2021   Atypical chest pain 12/13/2021   Cellulitis 08/28/2021   Dermatitis due to plants, including poison ivy, sumac, and oak 08/28/2021   Recurrent AOM (acute otitis media) 01/25/2021   Caregiver stress 10/20/2017   BPH (benign prostatic hyperplasia) 10/21/2014   COPD, mild (HCC) 10/21/2014   Type 2 diabetes mellitus with microalbuminuria, without long-term current use of insulin (HCC) 10/21/2014   Failure of erection 10/21/2014   Hyperlipidemia associated with type 2 diabetes mellitus (HCC) 10/21/2014   Microalbuminuria due to type 2 diabetes mellitus (HCC) 10/21/2014   Obesity 10/21/2014   Allergic rhinitis 10/21/2014   Apnea, sleep 10/21/2014   Compulsive tobacco user syndrome 10/21/2014   Microalbuminuria 10/21/2014      Patient Care Team: Erasmo Downer, MD as PCP - General (Family Medicine) Debbe Odea, MD as PCP - Cardiology (Cardiology) Galen Manila, MD as Referring Physician (Ophthalmology) Deirdre Evener, MD (Dermatology)   Outpatient Medications Prior to Visit  Medication Sig   aspirin EC 81 MG tablet Take 1 tablet (81 mg total) by mouth daily. Swallow whole.   atorvastatin (LIPITOR) 40 MG tablet Take 1 tablet (40 mg total) by mouth daily. Please schedule office visit before any future refills   fluticasone (FLONASE) 50 MCG/ACT nasal spray Place 2 sprays into both nostrils daily.   lisinopril (ZESTRIL) 5 MG tablet Take 1 tablet (5 mg total) by mouth daily. Please schedule office visit before any future refills   loratadine (CLARITIN) 10 MG tablet Take 1 tablet (10 mg total) by mouth daily.   meloxicam (MOBIC) 15 MG tablet Take 1 tablet (15 mg total) by mouth as needed. (Patient taking differently: Take 15 mg by mouth as needed. Rarely needs)   metFORMIN (GLUCOPHAGE) 500 MG tablet Take 1 tablet (500 mg total) by mouth at bedtime. Please schedule office visit before any future refills   Multiple Vitamin (MULTIVITAMIN ADULT PO) Take by mouth.   nitroGLYCERIN (NITROSTAT) 0.4 MG SL tablet Place 1 tablet (0.4 mg total) under the tongue every 5 (five) minutes as needed for chest pain. You can repeat 3 time - after the 3rd time you will need to call 911   nystatin-triamcinolone ointment (MYCOLOG) Apply  1 Application topically 2 (two) times daily.   tamsulosin (FLOMAX) 0.4 MG CAPS capsule Take 1 capsule (0.4 mg total) by mouth daily. Please schedule office visit before any future refills   No facility-administered medications prior to visit.    Review of Systems  All other systems reviewed and are negative.         Objective:     BP (!) 129/55 (BP Location: Left Arm, Patient Position: Sitting, Cuff Size: Large)   Pulse 67   Temp 97.8 F (36.6 C) (Temporal)   Resp 16   Ht 5\' 7"  (1.702 m)   Wt 208 lb 3.2 oz (94.4 kg)   SpO2 98%   BMI 32.61 kg/m  BP Readings from  Last 3 Encounters:  12/23/22 (!) 129/55  06/25/22 130/72  06/06/22 112/72   Wt Readings from Last 3 Encounters:  12/23/22 208 lb 3.2 oz (94.4 kg)  06/25/22 200 lb (90.7 kg)  06/06/22 200 lb (90.7 kg)      Physical Exam Constitutional:      Appearance: Normal appearance.  HENT:     Head: Normocephalic and atraumatic.     Right Ear: Tympanic membrane, ear canal and external ear normal.     Left Ear: Tympanic membrane, ear canal and external ear normal.     Mouth/Throat:     Mouth: Mucous membranes are moist.     Pharynx: Oropharynx is clear.  Eyes:     Conjunctiva/sclera: Conjunctivae normal.     Pupils: Pupils are equal, round, and reactive to light.  Cardiovascular:     Rate and Rhythm: Normal rate and regular rhythm.     Heart sounds: Normal heart sounds.  Pulmonary:     Effort: Pulmonary effort is normal.     Breath sounds: Normal breath sounds.  Neurological:     General: No focal deficit present.     Mental Status: He is alert and oriented to person, place, and time.     No results found for any visits on 12/23/22. Last metabolic panel Lab Results  Component Value Date   GLUCOSE 171 (H) 04/15/2022   NA 138 04/15/2022   K 3.9 04/15/2022   CL 105 04/15/2022   CO2 27 04/15/2022   BUN 17 04/15/2022   CREATININE 0.81 04/15/2022   GFRNONAA >60 04/15/2022   CALCIUM 9.2 04/15/2022   PROT 6.6 04/15/2022   ALBUMIN 3.8 04/15/2022   LABGLOB 2.1 12/13/2021   AGRATIO 2.0 12/13/2021   BILITOT 0.8 04/15/2022   ALKPHOS 88 04/15/2022   AST 20 04/15/2022   ALT 22 04/15/2022   ANIONGAP 6 04/15/2022   Last lipids Lab Results  Component Value Date   CHOL 122 12/13/2021   HDL 31 (L) 12/13/2021   LDLCALC 56 12/13/2021   TRIG 217 (H) 12/13/2021   CHOLHDL 3.9 12/13/2021   Last hemoglobin A1c Lab Results  Component Value Date   HGBA1C 6.6 (H) 12/13/2021        Assessment & Plan:    Routine Health Maintenance and Physical Exam  Immunization History   Administered Date(s) Administered   Fluad Quad(high Dose 65+) 02/02/2019, 02/23/2020   Influenza Split 02/27/2012   Influenza, High Dose Seasonal PF 02/19/2016, 01/15/2017, 01/21/2018, 02/15/2021   Influenza,inj,Quad PF,6+ Mos 02/18/2014   Influenza-Unspecified 03/07/2015   PFIZER(Purple Top)SARS-COV-2 Vaccination 05/28/2019, 06/18/2019, 02/11/2020   Pfizer Covid-19 Vaccine Bivalent Booster 23yrs & up 02/15/2021   Pneumococcal Conjugate-13 06/08/2014   Pneumococcal Polysaccharide-23 12/16/2011   Td 09/11/2004   Tetanus 10/04/2017  Zoster Recombinant(Shingrix) 07/18/2019   Zoster, Live 02/27/2012    Health Maintenance  Topic Date Due   Zoster Vaccines- Shingrix (2 of 2) 09/12/2019   COVID-19 Vaccine (5 - 2023-24 season) 01/04/2022   Diabetic kidney evaluation - Urine ACR  05/21/2022   FOOT EXAM  05/21/2022   HEMOGLOBIN A1C  06/15/2022   INFLUENZA VACCINE  12/05/2022   Diabetic kidney evaluation - eGFR measurement  04/16/2023   OPHTHALMOLOGY EXAM  05/03/2023   Medicare Annual Wellness (AWV)  05/29/2023   Lung Cancer Screening  07/29/2023   Colonoscopy  10/31/2023   DTaP/Tdap/Td (3 - Tdap) 10/05/2027   Pneumonia Vaccine 20+ Years old  Completed   Hepatitis C Screening  Completed   HPV VACCINES  Aged Out    Discussed health benefits of physical activity, and encouraged him to engage in regular exercise appropriate for his age and condition.  Problem List Items Addressed This Visit       Respiratory   COPD, mild (HCC)    Chronic and stable  No medications required Encourage tobacco cessation        Endocrine   Type 2 diabetes mellitus with microalbuminuria, without long-term current use of insulin (HCC)    Chronic, well controlled  Most recent A1c was 6.6 (a year ago) Continue metformin 500 mg at bedtime  Recheck A1c  Recheck U microalbumin Cr ratio  Foot exam completed  Glucose monitoring strips and meter prescribed       Relevant Medications   Blood Glucose  Monitoring Suppl (ONE TOUCH ULTRA 2) w/Device KIT   OneTouch Delica Lancets 33G MISC   glucose blood (ONETOUCH ULTRA TEST) test strip   Other Relevant Orders   HgB A1c   Urine Microalbumin w/creat. ratio   Hyperlipidemia associated with type 2 diabetes mellitus (HCC)    Chronic, relatively well controlled  Last lipid panel significant for low HDL and elevated triglycerides  Continue Lipitor  Recheck lipid panel  Recheck CMP      Relevant Orders   Lipid Profile   Comprehensive Metabolic Panel (CMET)   Microalbuminuria due to type 2 diabetes mellitus (HCC)    Chronic and stable  Recheck uACR Continue low dose lisinopril       Relevant Orders   Urine Microalbumin w/creat. ratio     Musculoskeletal and Integument   Other nonthrombocytopenic purpura (HCC)    Stable         Genitourinary   BPH (benign prostatic hyperplasia)    Chronic and stable  Continue Flomax  Recheck PSA       Relevant Orders   PSA Total (Reflex To Free)     Other   Annual physical exam - Primary   PMS2 gene mutation positive    Encourage regular cancer screenings       Return in about 6 months (around 06/25/2023) for chronic disease f/u.     Rometta Emery, Medical Student   Patient seen along with MS3 student Jodi Marble. I personally evaluated this patient along with the student, and verified all aspects of the history, physical exam, and medical decision making as documented by the student. I agree with the student's documentation and have made all necessary edits.  Sandra Brents, Marzella Schlein, MD, MPH [facility]

## 2022-12-24 ENCOUNTER — Encounter: Payer: Self-pay | Admitting: Family Medicine

## 2022-12-24 LAB — COMPREHENSIVE METABOLIC PANEL
ALT: 19 IU/L (ref 0–44)
AST: 20 IU/L (ref 0–40)
Albumin: 4.3 g/dL (ref 3.8–4.8)
Alkaline Phosphatase: 106 IU/L (ref 44–121)
BUN/Creatinine Ratio: 15 (ref 10–24)
BUN: 16 mg/dL (ref 8–27)
Bilirubin Total: 0.6 mg/dL (ref 0.0–1.2)
CO2: 25 mmol/L (ref 20–29)
Calcium: 10.2 mg/dL (ref 8.6–10.2)
Chloride: 102 mmol/L (ref 96–106)
Creatinine, Ser: 1.08 mg/dL (ref 0.76–1.27)
Globulin, Total: 2.1 g/dL (ref 1.5–4.5)
Glucose: 184 mg/dL — ABNORMAL HIGH (ref 70–99)
Potassium: 5.4 mmol/L — ABNORMAL HIGH (ref 3.5–5.2)
Sodium: 141 mmol/L (ref 134–144)
Total Protein: 6.4 g/dL (ref 6.0–8.5)
eGFR: 71 mL/min/{1.73_m2} (ref >59)

## 2022-12-24 LAB — HEMOGLOBIN A1C
Est. average glucose Bld gHb Est-mCnc: 146 mg/dL
Hgb A1c MFr Bld: 6.7 % — ABNORMAL HIGH (ref 4.8–5.6)

## 2022-12-24 LAB — PSA TOTAL (REFLEX TO FREE): Prostate Specific Ag, Serum: 1.1 ng/mL (ref 0.0–4.0)

## 2022-12-24 LAB — LIPID PANEL
Chol/HDL Ratio: 4.3 ratio (ref 0.0–5.0)
Cholesterol, Total: 129 mg/dL (ref 100–199)
HDL: 30 mg/dL — ABNORMAL LOW (ref >39)
LDL Chol Calc (NIH): 66 mg/dL (ref 0–99)
Triglycerides: 198 mg/dL — ABNORMAL HIGH (ref 0–149)
VLDL Cholesterol Cal: 33 mg/dL (ref 5–40)

## 2022-12-24 LAB — MICROALBUMIN / CREATININE URINE RATIO
Creatinine, Urine: 63.1 mg/dL
Microalb/Creat Ratio: <5 mg/g{creat} (ref 0–29)
Microalbumin, Urine: <3 ug/mL

## 2022-12-28 DIAGNOSIS — G4733 Obstructive sleep apnea (adult) (pediatric): Secondary | ICD-10-CM | POA: Diagnosis not present

## 2022-12-29 DIAGNOSIS — G4733 Obstructive sleep apnea (adult) (pediatric): Secondary | ICD-10-CM | POA: Diagnosis not present

## 2023-01-09 ENCOUNTER — Encounter: Payer: Self-pay | Admitting: Family Medicine

## 2023-01-09 DIAGNOSIS — E875 Hyperkalemia: Secondary | ICD-10-CM

## 2023-01-10 LAB — COMPREHENSIVE METABOLIC PANEL
ALT: 18 IU/L (ref 0–44)
AST: 16 IU/L (ref 0–40)
Albumin: 4.1 g/dL (ref 3.8–4.8)
Alkaline Phosphatase: 93 IU/L (ref 44–121)
BUN/Creatinine Ratio: 13 (ref 10–24)
BUN: 13 mg/dL (ref 8–27)
Bilirubin Total: 0.6 mg/dL (ref 0.0–1.2)
CO2: 24 mmol/L (ref 20–29)
Calcium: 9.4 mg/dL (ref 8.6–10.2)
Chloride: 103 mmol/L (ref 96–106)
Creatinine, Ser: 0.99 mg/dL (ref 0.76–1.27)
Globulin, Total: 2.1 g/dL (ref 1.5–4.5)
Glucose: 205 mg/dL — ABNORMAL HIGH (ref 70–99)
Potassium: 4.4 mmol/L (ref 3.5–5.2)
Sodium: 140 mmol/L (ref 134–144)
Total Protein: 6.2 g/dL (ref 6.0–8.5)
eGFR: 78 mL/min/{1.73_m2} (ref >59)

## 2023-01-21 ENCOUNTER — Ambulatory Visit (INDEPENDENT_AMBULATORY_CARE_PROVIDER_SITE_OTHER): Payer: Medicare Other | Admitting: Family Medicine

## 2023-01-21 VITALS — BP 114/47 | HR 60 | Ht 67.0 in | Wt 207.2 lb

## 2023-01-21 DIAGNOSIS — L0211 Cutaneous abscess of neck: Secondary | ICD-10-CM | POA: Diagnosis not present

## 2023-01-21 DIAGNOSIS — Z23 Encounter for immunization: Secondary | ICD-10-CM | POA: Diagnosis not present

## 2023-01-21 DIAGNOSIS — F17200 Nicotine dependence, unspecified, uncomplicated: Secondary | ICD-10-CM | POA: Diagnosis not present

## 2023-01-21 MED ORDER — SULFAMETHOXAZOLE-TRIMETHOPRIM 800-160 MG PO TABS
1.0000 | ORAL_TABLET | Freq: Two times a day (BID) | ORAL | 0 refills | Status: AC
Start: 2023-01-21 — End: ?

## 2023-01-21 NOTE — Assessment & Plan Note (Signed)
Chronic, stable Remains pre contemplative regarding reduction with cessation efforts at this time Continue to monitor

## 2023-01-21 NOTE — Progress Notes (Signed)
Established patient visit   Patient: David [name].   DOB: May 10, 1945   77 y.o. Male  MRN: [id number] Visit Date: 01/21/2023  Today's healthcare provider: Jacky Kindle, FNP  Introduced to nurse practitioner role and practice setting.  All questions answered.  Discussed provider/patient relationship and expectations.  Subjective     Medications: Outpatient Medications Prior to Visit  Medication Sig   aspirin EC 81 MG tablet Take 1 tablet (81 mg total) by mouth daily. Swallow whole.   atorvastatin (LIPITOR) 40 MG tablet Take 1 tablet (40 mg total) by mouth daily. Please schedule office visit before any future refills   Blood Glucose Monitoring Suppl (ONE TOUCH ULTRA 2) w/Device KIT Use as directed   fluticasone (FLONASE) 50 MCG/ACT nasal spray Place 2 sprays into both nostrils daily.   glucose blood (ONETOUCH ULTRA TEST) test strip Use as instructed to test blood sugar once daily   lisinopril (ZESTRIL) 5 MG tablet Take 1 tablet (5 mg total) by mouth daily. Please schedule office visit before any future refills   loratadine (CLARITIN) 10 MG tablet Take 1 tablet (10 mg total) by mouth daily.   meloxicam (MOBIC) 15 MG tablet Take 1 tablet (15 mg total) by mouth as needed. (Patient taking differently: Take 15 mg by mouth as needed. Rarely needs)   metFORMIN (GLUCOPHAGE) 500 MG tablet Take 1 tablet (500 mg total) by mouth at bedtime. Please schedule office visit before any future refills   Multiple Vitamin (MULTIVITAMIN ADULT PO) Take by mouth.   OneTouch Delica Lancets 33G MISC Use as directed   tamsulosin (FLOMAX) 0.4 MG CAPS capsule Take 1 capsule (0.4 mg total) by mouth daily. Please schedule office visit before any future refills   nitroGLYCERIN (NITROSTAT) 0.4 MG SL tablet Place 1 tablet (0.4 mg total) under the tongue every 5 (five) minutes as needed for chest pain. You can repeat 3 time - after the 3rd time you will need to call 911   No facility-administered medications prior  to visit.     Objective    BP (!) 114/47 (BP Location: Right Arm, Patient Position: Sitting, Cuff Size: Normal)   Pulse 60   Ht 5\' 7"  (1.702 m)   Wt 207 lb 3.2 oz (94 kg)   SpO2 100%   BMI 32.45 kg/m   Physical Exam Vitals and nursing note reviewed.  Constitutional:      General: He is not in acute distress.    Appearance: Normal appearance. He is obese. He is not ill-appearing, toxic-appearing or diaphoretic.  HENT:     Head: Normocephalic and atraumatic.  Eyes:     Pupils: Pupils are equal, round, and reactive to light.  Neck:   Cardiovascular:     Rate and Rhythm: Normal rate and regular rhythm.     Pulses: Normal pulses.     Heart sounds: Normal heart sounds.  Pulmonary:     Effort: Pulmonary effort is normal.     Breath sounds: Normal breath sounds.  Musculoskeletal:        General: Normal range of motion.     Cervical back: Normal range of motion.  Skin:    General: Skin is warm and dry.     Capillary Refill: Capillary refill takes less than 2 seconds.  Neurological:     General: No focal deficit present.     Mental Status: He is alert and oriented to person, place, and time. Mental status is at baseline.  No results found for any visits on 01/21/23.  Assessment & Plan     Problem List Items Addressed This Visit       Other   Compulsive tobacco user syndrome    Chronic, stable Remains pre contemplative regarding reduction with cessation efforts at this time Continue to monitor       Neck abscess - Primary    Acute, self limiting No systemic s/s of infection including fevers, chills, body aches Recommend supportive care and abx; referral to general surgery if not resolved F/u as needed      Relevant Medications   sulfamethoxazole-trimethoprim (BACTRIM DS) 800-160 MG tablet   Other Relevant Orders   Ambulatory referral to General Surgery   Other Visit Diagnoses     Flu vaccine need       Relevant Orders   Flu Vaccine Trivalent High Dose  (Fluad) (Completed)      Return if symptoms worsen or fail to improve.     Leilani Merl, FNP, have reviewed all documentation for this visit. The documentation on 01/21/23 for the exam, diagnosis, procedures, and orders are all accurate and complete.  Jacky Kindle, FNP  [facility] [phone] (phone) [phone] (fax)  [facility]

## 2023-01-21 NOTE — Assessment & Plan Note (Signed)
Acute, self limiting No systemic s/s of infection including fevers, chills, body aches Recommend supportive care and abx; referral to general surgery if not resolved F/u as needed

## 2023-01-22 NOTE — Progress Notes (Unsigned)
REFERRING PROVIDER: Theadore Nan, NP [address] [place],  [place] [address]  PRIMARY PROVIDER:  Erasmo Downer, MD  PRIMARY REASON FOR VISIT:  1. Personal history of colonic polyps   2. PMS2 gene mutation positive   3. Family history of bladder cancer   4. Family history of uterine cancer   5. Family history of lung cancer   6. Family history of brain cancer   7. Personal history of melanoma in-situ   8. Family history of colon cancer     HISTORY OF PRESENT ILLNESS:   David [name], a 77 y.o. male, was seen for a [place] cancer genetics consultation at the request of Dr. Arvilla Market due to a personal history of colon polyps and reported PMS2 mutation and family history of cancer.  David [name] presents to clinic today to discuss the possibility of a hereditary predisposition to cancer, genetic testing, and to further clarify his future cancer risks, as well as potential cancer risks for family members.   CANCER HISTORY:  David [name] has a history of melanoma. He also has a history of colon polyps, he believes around 10 total.   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 He uploaded a copy of this report to MyChart.  The report notes that this mutation should be confirmed with a clinical test. He also notes that he did confirmation testing and plans to upload the report so I can document it in his chart. Genes analyzed include:   ACTA2 ([id number]), ACTC1 (JX_914782), APC ([id number]), APOB (NF_621308), ATP7B (NM_000053), BMPR1A(NM_004329), BRCA1 (MV_784696), BRCA2 (NM_000059), CACNA1S (NM_000069), COL3A1 (NM_000090), DSC2(NM_024422), DSG2 (EX_528413), DSP (KG_401027), FBN1 (OZ_366440), GLA (HK_742595), KCNH2 (GL_875643), KCNQ1(NM_000218), LDLR (PI_951884), LMNA (ZY_606301; SW_109323), MEN1 (FT_732202), MLH1 (RK_270623), MSH2(NM_000251), MSH6 (JS_283151), MUTYH  (VO_[id number]), MYBPC3 (YI_948546), MYH11 (EV_[id number]), MYH7(NM_000257), MYL2 (WE_993716), MYL3 (RC_789381), NF2 (OF_751025), OTC (EN_277824), PCSK9 (MP_536144), RXV4(MG_867619), PMS2 (JK_932671), PRKAG2 (IW_580998), PTEN (PJ_825053), RB1 (ZJ_673419), RET (FX_902409), RYR1(NM_000540), RYR2 (BD_532992), SCN5A (EQ_683419), SDHAF2 (QQ_229798), SDHB (NM_003000), SDHC (NM_003001), SDHD(NM_003002), SMAD3 (XQ_119417), SMAD4 (EY_814481), STK11 (EH_631497), TGFBR1 (WY_637858), TGFBR2 (NM_003242),TMEM43 (IF_027741), TNNI3 (OI_786767), TNNT2 (MC_947096283), TP53 (MO_294765), TPM1 (YY_503546568), TSC1(NM_000368), TSC2 (LE_751700), VHL (FV_494496), WT1 (PR_916384)      Past Medical History:  Diagnosis Date   Actinic keratosis 01/13/2017   R upper back post base of neck - biopsy proven    Adenomatous colon polyp    Allergy    Aortic atherosclerosis (HCC)    Atypical chest pain    BPH (benign prostatic hyperplasia)    CAD (coronary artery disease)    a.) CT chest 07/26/2021: 2v CAD; b.) cCTA 02/18/2022: CA score 159 (38th percentile for age/sex match); 25-49% pLAD and RCA distribution.   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  Atypical squamous nests are concerning for invasive SCC. MOHs 09/11/2020   T2DM (type 2 diabetes mellitus) [facility])     Past Surgical History:  Procedure Laterality Date   broken right arm     in  seventh grade   CHOLECYSTECTOMY  2001   COLONOSCOPY WITH PROPOFOL N/A 05/08/2018   Procedure: COLONOSCOPY WITH PROPOFOL;  Surgeon: Scot Jun, MD;  Location: [facility] ENDOSCOPY;  Service: Endoscopy;  Laterality: N/A;   COLONOSCOPY WITH PROPOFOL N/A 10/30/2020   Procedure: COLONOSCOPY WITH PROPOFOL;  Surgeon: Toledo, Boykin Nearing, MD;  Location: [facility] ENDOSCOPY;  Service: Gastroenterology;  Laterality: N/A;   MELANOMA EXCISION  1998   PILONIDAL CYST EXCISION N/A 05/13/2022   Procedure: CYST EXCISION PILONIDAL EXTENSIVE;  Surgeon: Campbell Lerner, MD;  Location: [facility] ORS;  Service: General;  Laterality: N/A;   TONSILLECTOMY AND ADENOIDECTOMY  1953    FAMILY HISTORY:  We obtained a detailed, 4-generation family history.  Significant diagnoses are listed below: Family History  Problem Relation Age of Onset   Cervical cancer Mother    Colon cancer Mother    Colon polyps Brother    Cervical cancer Maternal Grandmother    Diabetes Maternal Grandmother    Tuberculosis Maternal Grandmother    Lung cancer Maternal Grandfather    Cervical cancer Maternal Aunt    David [name] has 2 daughters, Tresa Endo and Harold, 74 and 71. They have not had genetic testing. He also had 1 brother, Gerlene Burdock, 24, also has not had genetic testing (as far as patient knows).  David [name] mother had uterine cancer at 7, bladder cancer at 47, colon cancer at 58 and passed at 68. Maternal grandfather had lung cancer at 35 and died at 38.  David [name] father died at 13. A male paternal cousin died of leukemia at 42. Another male paternal cousin died of brain cancer at 91. Two male paternal cousins had lung cancer.   David [name] is aware of previous family history of genetic testing for hereditary cancer risks. There is no reported Ashkenazi Jewish ancestry. There isn o known consanguinity.    GENETIC COUNSELING ASSESSMENT: Mr. Millet is a 77 y.o. male with a family history of cancer and research report that shows a PMS2  mutation. We, therefore, discussed and recommended the following at today's visit.   DISCUSSION: We discussed that approximately 10% of cancer cancer is hereditary. 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 He reports that he did have confirmation testing and will share the report with [place].  We discussed Lynch syndrome in detail noting potential management changes associated. There are other genes we can test related to other types of cancer. Cancers and risks are gene specific. We discussed that testing is beneficial for several reasons including knowing about cancer risks, identifying potential screening and risk-reduction options that may be appropriate, and to understand if other family members could be at risk for cancer and allow them to undergo genetic testing.   We reviewed the characteristics, features and inheritance patterns of hereditary cancer syndromes. We also discussed genetic testing, including the appropriate family members to test, the process of testing, insurance coverage and turn-around-time for results. We discussed the implications of a negative, positive and/or variant of uncertain significant result. If Mr. Fulk uploads his confirmation testing, he would not need update testing but could consider doing the [facility] Common Hereditary Cancers+RNA panel so that his daughters/brothers could have free familial testing. He plans to discuss  this with his kids/brother and let me know.  PMS2 Clinical Information Pathogenic variants in the PMS2 gene are associated with Lynch syndrome.  The cancers associated with PMS2 are: Colorectal cancer, 8.7-20% risk (average age of diagnosis is 22-66) Endometrial cancer, 13-26% risk (average age of diagnosis is 1-50) Ovarian cancer, up to a 3% risk (average age of diagnosis is 48-59) Renal pelvis and/or ureter, up to a 3.7% risk (average age of diagnosis is unknown) Breast, prostate,  pancreatic, gastric, small bowel, bladder, biliary tract, and brain cancer risk may be elevated  Management Recommendations:  Colorectal Cancer Screening: High quality colonoscopy at age 67-35 or 2-5 years prior to the earliest colon cancer if it is diagnosed before age 6 and repeat every 1-3 years Studies have demonstrated that the use of daily aspirin decreases the colorectal cancer risk in patients with Lynch Syndrome. Ongoing studies are investigating the optimal dose and duration of use of aspirin for colon cancer prevention in Lynch Syndrome. The decision to use aspirin should be made on an individualized basis including a discussion of dose, benefits, and adverse effects.    Endometrial Cancer Screening/Risk Reduction: Women should report any abnormal uterine bleeding or postmenopausal bleeding. The evaluation of these symptoms should include an endometrial biopsy. A hysterectomy may be considered. The timing should be individualized based on whether childbearing is complete, comorbidities, and family history. Endometrial cancer screening does not have a proven benefit in women with Lynch Syndrome. However, endometrial biopsy is highly sensitive and specific as a diagnostic procedure. Screening via endometrial biopsy every 1-2 years starting at age 56-35 can be considered. Transvaginal ultrasounds may be considered in postmenopausal women at their clinician's discretion.  Ovarian Cancer Screening/Risk Reduction: There is currently insufficient evidence to make a specific recommendation for a prophylactic bilateral salpingo-oophorectomy (BSO), or having the ovaries and fallopian tubes removed, for individuals who carry a PMS2 pathogenic variant. Current data does not support routine ovarian screening for Lynch syndrome, therefore it may be considered at the clinician's discretion. Screening includes transvaginal ultrasounds and a blood test to measure CA-125 levels every 6-12 months. If there  is a family history of ovarian cancer, have a discussion with your physician about the benefits and limitations of screening and risk reduction strategies.  Women should be aware of symptoms that might be associated with the development of ovarian cancer including pelvic or abdominal pain, bloating, increased abdominal girth, difficulty eating, early satiety, or urinary frequency or urgency. Symptoms that persist for several weeks and are a change from a woman's baseline should prompt evaluation by her physician.  Urothelial Cancer Screening: Annual urinalysis starting at age 74-35 may be considered in selected individuals such as those with a family history of urothelial cancer (renal pelvis, ureter, and/or bladder).  Gastric and Small Bowel Cancer Screening: Consider upper GI surveillance with EGD starting at age 74-40 years and repeat every 2-4 years, preferably performed in conjunction with colonoscopy.  Random biopsy of the proximal and distal stomach should at minimum be performed on the initial procedure to assess for H. pylori, autoimmune gastritis, and intestinal metaplasia. Push enteroscopy can be considered in place of EGD to enhance small bowel visualization, although its incremental yield for detection of neoplasia over EGD remains uncertain. Individuals not undergoing upper endoscopic surveillance should have one-time noninvasive testing for H. pylori at the time of Lynch Syndrome diagnosis, with treatment indicated if H. pylori is detected.   Pancreatic Cancer Screening: Avoid smoking, heavy alcohol use, and obesity. It has been  suggested that pancreatic cancer screening be limited to those with a family history of pancreatic cancer (first- or second-degree relative). Ideally, screening should be performed in experienced centers utilizing a multidisciplinary approach under research conditions. Recommended screening include annual endoscopic ultrasound (preferred) and/or MRI of the  pancreas starting at age 73 or 20 years younger than the earliest age diagnosis in the family. Annual concurrent CA19-9 testing should also be considered.  Prostate Cancer Screening: Consider beginning annual PSA blood test and digital rectal exams at age 86.  Breast Cancer Screening/Risk Reduction: Breast awareness beginning at age 42 Monthly self-breast examination beginning at age 48 Annual clinical breast examinations beginning at age 68 Annual mammogram beginning at age 54 with consideration of tomosynthesis Evidence is insufficient for a prophylactic risk-reducing mastectomy, manage based on family history    Brain Cancer Screening: Patients should be educated regarding signs and symptoms of neurologic cancer and the importance of prompt reporting of abnormal symptoms to their physicians.  Skin Cancer Screening: Frequency of malignant and benign skin tumors such as sebaceous adenocarcinomas, sebaceous adenomas, and keratoacanthomas has been reported to be increased among patients with Lynch Syndrome, but cumulative lifetime risk and median age of presentation are uncertain. Further, an elevated risk of sebaceous tumors and keratoacanthoma has not been documented for PMS2 carriers. Consider skin exam every 1-2 years with a health care provider skilled in identifying Lynch Syndrome-associated skin manifestations. Age to start surveillance is uncertain and can be individualized.  Additional Considerations: Patients of reproductive age should be made aware of options for prenatal diagnosis and assisted reproduction including pre-implantation genetic diagnosis. Individuals with a single pathogenic PMS2 variant are also carriers of constitutional MMR deficiency (CMMRD) syndrome. CMMRD is a childhood-onset cancer predisposition syndrome that can present with hematological malignancies, cancers of the brain and central nervous system, Lynch syndrome-associated cancers (colon, uterine, small bowel,  urinary tract), embryonic tumors, and sarcomas. Some affected individuals may also display cafe-au-lait macules. For there to be a risk of CMMRD in offspring, an individual and their partner would each have to have a single pathogenic variant in the same MMR gene; in such a case, the risk of having an affected child is 25%.   This information is based on current understanding of the gene and may change in the future.  Implications for Family Members: Hereditary predisposition to cancer due to pathogenic variants in the PMS2 gene has autosomal dominant inheritance. This means that an individual with a pathogenic variant has a 50% chance of passing the condition on to his/her offspring. Most cases are inherited from a parent, but some cases may occur spontaneously (i.e., an individual with a pathogenic variant has parents who do not have it). Identification of a pathogenic variant allows for the recognition of at-risk relatives who can pursue testing for the familial variant.  Family members are encouraged to consider genetic testing for this familial pathogenic variant. As there are generally no childhood cancer risks associated with pathogenic variants in the PSM2 gene, individuals in the family are not recommended to have testing until they reach at least 78 years of age. They may contact our office at [phone] for more information or to schedule an appointment.    Resources: FORCE (Facing Our Risk of Cancer Empowered) is a resource for those with a hereditary predisposition to develop cancer.  [employer]CE provides information about risk reduction, advocacy, legislation, and clinical trials.  Additionally, [employer] provides a platform for collaboration and support which includes: peer navigation, message boards, local support  groups, a toll-free helpline, research registry and recruitment, advocate training, published medical research, webinars, brochures, mastectomy photos, and more.  For more information,  visit www.facingourrisk.org  Hereditary Colon Cancer Foundation- www.hcctakesguts.org  Lynch Syndrome [employer]- www.lynchcancers.com  [employer]- www.kintalk.org  PLAN:   1. Mr. Shawcroft will provide a copy of his confirmation testing and I will upload to his chart/document. He is followed by [facility] and has colonoscopy scheduled for November.  2. Mr. Benoit plans to discuss these results with her family and will reach out to [place] if we can be of any assistance in coordinating genetic testing for any of her relatives.   We encouraged Mr. Serpe to remain in contact with [place] on an annual basis so we can update his personal and family histories, and let him know of advances in cancer genetics that may benefit the family. Our contact number was provided. Mr. Ferrando questions were answered to his satisfaction today, and he knows he is welcome to call anytime with additional questions.   Lacy Duverney, MS, [facility] Genetic Counselor [email or web address] Phone: [phone]  The patient was seen for a total of 20 minutes in face-to-face genetic counseling.  Dr. Blake Divine was available for discussion regarding this case.   _______________________________________________________________________ For Office Staff:  Number of people involved in session: 1 Was an Intern/ student involved with case: no

## 2023-01-23 ENCOUNTER — Encounter: Payer: Self-pay | Admitting: Licensed Clinical Social Worker

## 2023-01-23 ENCOUNTER — Inpatient Hospital Stay: Payer: Medicare Other | Attending: Oncology | Admitting: Licensed Clinical Social Worker

## 2023-01-23 ENCOUNTER — Inpatient Hospital Stay: Payer: Medicare Other

## 2023-01-23 DIAGNOSIS — Z808 Family history of malignant neoplasm of other organs or systems: Secondary | ICD-10-CM

## 2023-01-23 DIAGNOSIS — Z1501 Genetic susceptibility to malignant neoplasm of breast: Secondary | ICD-10-CM

## 2023-01-23 DIAGNOSIS — Z8052 Family history of malignant neoplasm of bladder: Secondary | ICD-10-CM

## 2023-01-23 DIAGNOSIS — Z800 Family history of malignant neoplasm of digestive organs: Secondary | ICD-10-CM

## 2023-01-23 DIAGNOSIS — Z801 Family history of malignant neoplasm of trachea, bronchus and lung: Secondary | ICD-10-CM

## 2023-01-23 DIAGNOSIS — Z86010 Personal history of colonic polyps: Secondary | ICD-10-CM

## 2023-01-23 DIAGNOSIS — Z8049 Family history of malignant neoplasm of other genital organs: Secondary | ICD-10-CM

## 2023-01-23 DIAGNOSIS — Z86006 Personal history of melanoma in-situ: Secondary | ICD-10-CM

## 2023-01-23 NOTE — Progress Notes (Signed)
Update: Patient provided report from [facility] - this is his test confirming the PMS2 mutation found through the [employer] [place] research study. It has been uploaded to Epic under molecular pathology.

## 2023-01-29 ENCOUNTER — Other Ambulatory Visit: Payer: Self-pay | Admitting: Family Medicine

## 2023-01-29 ENCOUNTER — Encounter: Payer: Self-pay | Admitting: Family Medicine

## 2023-01-30 ENCOUNTER — Telehealth: Payer: Self-pay | Admitting: Licensed Clinical Social Worker

## 2023-01-30 NOTE — Telephone Encounter (Signed)
Patient called to request [facility] testing so that his children/siblings can have option of family testing at no cost. He will come in 9/27 for blood draw.   Lacy Duverney, MS, [facility] Genetic Counselor [email or web address] Phone: [phone]

## 2023-01-31 ENCOUNTER — Inpatient Hospital Stay: Payer: Medicare Other

## 2023-02-04 ENCOUNTER — Ambulatory Visit: Payer: Medicare Other | Admitting: Surgery

## 2023-02-04 ENCOUNTER — Encounter: Payer: Self-pay | Admitting: Surgery

## 2023-02-04 VITALS — BP 131/72 | HR 69 | Temp 97.9°F | Ht 67.0 in | Wt 208.0 lb

## 2023-02-04 DIAGNOSIS — L720 Epidermal cyst: Secondary | ICD-10-CM | POA: Diagnosis not present

## 2023-02-04 DIAGNOSIS — L723 Sebaceous cyst: Secondary | ICD-10-CM | POA: Insufficient documentation

## 2023-02-04 NOTE — Progress Notes (Signed)
Patient ID: David Berg., male   DOB: [birth year], 77 y.o.   MRN: [id number]  Chief Complaint: Dermal cyst posterior neck  History of Present Illness David Colton. is a 77 y.o. male with a longstanding cyst of the upper right neck posteriorly.  Has recently had increased inflammation and, and tenderness.  Started on some antibiotics, was hoping it would come to ahead but has not.  Denies any fevers or chills.  Has had no drainage.  And actually no pain.  Currently takes baby aspirin, and as needed Mobic.  Past Medical History Past Medical History:  Diagnosis Date   Actinic keratosis 01/13/2017   R upper back post base of neck - biopsy proven    Adenomatous colon polyp    Allergy    Aortic atherosclerosis (HCC)    Atypical chest pain    BPH (benign prostatic hyperplasia)    CAD (coronary artery disease)    a.) CT chest 07/26/2021: 2v CAD; b.) cCTA 02/18/2022: CA score 159 (38th percentile for age/sex match); 25-49% pLAD and RCA distribution.   COPD (chronic obstructive pulmonary disease) (HCC)    Depression    Diastolic dysfunction 04/09/2022   a.) TTE 04/09/2022: EF 60-65%, mild LVH, AoV sclerosis, G1DD   Dysplastic nevus 12/23/2007   R med mid post thigh - slight atypia   Dysplastic nevus 12/22/2008   L mid ant thigh - slight atypia   Dysplastic nevus 05/10/2020   RUQA lat, severia atypia, excised 07/11/20   Hyperlipidemia    Hypertension    Melanoma (HCC) 1988   MM - clavicle, txt[facility]   Microalbuminuria    OSA on CPAP    Pilonidal disease    Squamous cell carcinoma of skin 07/18/2020   nasal tip - Aypical Squamous Proliferation with Desmoplasia. Atypical squamous nests are concerning for invasive SCC. MOHs 09/11/2020   T2DM (type 2 diabetes mellitus) [facility])       Past Surgical History:  Procedure Laterality Date   broken right arm     in seventh grade   CHOLECYSTECTOMY  2001   COLONOSCOPY WITH PROPOFOL N/A 05/08/2018   Procedure: COLONOSCOPY WITH PROPOFOL;   Surgeon: Scot Jun, MD;  Location: [facility] ENDOSCOPY;  Service: Endoscopy;  Laterality: N/A;   COLONOSCOPY WITH PROPOFOL N/A 10/30/2020   Procedure: COLONOSCOPY WITH PROPOFOL;  Surgeon: Toledo, Boykin Nearing, MD;  Location: [facility] ENDOSCOPY;  Service: Gastroenterology;  Laterality: N/A;   MELANOMA EXCISION  1998   PILONIDAL CYST EXCISION N/A 05/13/2022   Procedure: CYST EXCISION PILONIDAL EXTENSIVE;  Surgeon: Campbell Lerner, MD;  Location: [facility] ORS;  Service: General;  Laterality: N/A;   TONSILLECTOMY AND ADENOIDECTOMY  1953    Allergies  Allergen Reactions   Alcohol Other (See Comments)    Addiction    Current Outpatient Medications  Medication Sig Dispense Refill   aspirin EC 81 MG tablet Take 1 tablet (81 mg total) by mouth daily. Swallow whole. 90 tablet 3   atorvastatin (LIPITOR) 40 MG tablet Take 1 tablet (40 mg total) by mouth daily. Please schedule office visit before any future refills 90 tablet 0   Blood Glucose Monitoring Suppl (ONE TOUCH ULTRA 2) w/Device KIT Use as directed 1 kit 0   fluticasone (FLONASE) 50 MCG/ACT nasal spray Place 2 sprays into both nostrils daily. 48 g 0   glucose blood (ONETOUCH ULTRA TEST) test strip Use as instructed to test blood sugar once daily 100 each 12   lisinopril (ZESTRIL) 5 MG tablet  Take 1 tablet (5 mg total) by mouth daily. Please schedule office visit before any future refills 90 tablet 0   loratadine (CLARITIN) 10 MG tablet Take 1 tablet (10 mg total) by mouth daily. 90 tablet 3   meloxicam (MOBIC) 15 MG tablet Take 1 tablet (15 mg total) by mouth as needed. (Patient taking differently: Take 15 mg by mouth as needed. Rarely needs) 90 tablet 3   metFORMIN (GLUCOPHAGE) 500 MG tablet Take 1 tablet (500 mg total) by mouth at bedtime. Please schedule office visit before any future refills 90 tablet 0   Multiple Vitamin (MULTIVITAMIN ADULT PO) Take by mouth.     OneTouch Delica Lancets 33G MISC Use as directed 100 each 1   tamsulosin (FLOMAX)  0.4 MG CAPS capsule Take 1 capsule (0.4 mg total) by mouth daily. Please schedule office visit before any future refills 90 capsule 0   nitroGLYCERIN (NITROSTAT) 0.4 MG SL tablet Place 1 tablet (0.4 mg total) under the tongue every 5 (five) minutes as needed for chest pain. You can repeat 3 time - after the 3rd time you will need to call 911 90 tablet 3   No current facility-administered medications for this visit.    Family History Family History  Problem Relation Age of Onset   Uterine cancer Mother 46   Colon cancer Mother 18   Bladder Cancer Mother 59   Colon polyps Brother    Cervical cancer Maternal Grandmother    Diabetes Maternal Grandmother    Tuberculosis Maternal Grandmother    Lung cancer Maternal Grandfather    Leukemia Cousin    Brain cancer Cousin    Lung cancer Cousin       Social History Social History   Tobacco Use   Smoking status: Every Day    Current packs/day: 1.00    Average packs/day: 1 pack/day for 52.0 years (52.0 ttl pk-yrs)    Types: Cigarettes    Passive exposure: Past   Smokeless tobacco: Never   Tobacco comments:    Pt started smoking again. Is smoking 1.25-1.5 PPD  Vaping Use   Vaping status: Never Used  Substance Use Topics   Alcohol use: Not Currently    Comment: Previous Heavy Alcohol Abuse, sober   Drug use: Not Currently        Review of Systems  All other systems reviewed and are negative.    Physical Exam Blood pressure 131/72, pulse 69, temperature 97.9 F (36.6 C), temperature source Oral, height 5\' 7"  (1.702 m), weight 208 lb (94.3 kg), SpO2 97%. Last Weight  Most recent update: 02/04/2023 11:20 AM    Weight  94.3 kg (208 lb)             CONSTITUTIONAL: Well developed, and nourished, appropriately responsive and aware without distress.   EYES: Sclera non-icteric.   EARS, NOSE, MOUTH AND THROAT:  The oropharynx is clear. Oral mucosa is pink and moist.    Hearing is intact to voice.  NECK: Trachea is midline, and  there is no jugular venous distension.  LYMPH NODES:  Lymph nodes in the neck are not appreciated. RESPIRATORY:  Normal respiratory effort without pathologic use of accessory muscles. CARDIOVASCULAR:  Well perfused.  GI: The abdomen is  soft, nontender, and nondistended. There were no palpable masses.   MUSCULOSKELETAL:  Symmetrical muscle tone appreciated in all four extremities.    SKIN: Skin turgor is normal. No pathologic skin lesions appreciated.  Under the hairline of the upper right posterior neck  there is a 2 cm raised pink lump, firm without fluctuance, no obvious punctum.  Some dermal involvement, otherwise mobile from deep tissues. NEUROLOGIC:  Motor and sensation appear grossly normal.  Cranial nerves are grossly without defect. PSYCH:  Alert and oriented to person, place and time. Affect is appropriate for situation.  Data Reviewed I have personally reviewed what is currently available of the patient's imaging, recent labs and medical records.   Labs:     Latest Ref Rng & Units 04/15/2022    2:18 PM 12/13/2021    3:26 PM 01/19/2015    8:48 AM  CBC  WBC 4.0 - 10.5 K/uL 9.2  10.0  8.3   Hemoglobin 13.0 - 17.0 g/dL 47.8  29.5  62.1   Hematocrit 39.0 - 52.0 % 42.1  47.6  41.7   Platelets 150 - 400 K/uL 218  221  245       Latest Ref Rng & Units 01/09/2023    9:06 AM 12/23/2022   10:56 AM 04/15/2022    2:18 PM  CMP  Glucose 70 - 99 mg/dL 308  657  846   BUN 8 - 27 mg/dL 13  16  17    Creatinine 0.76 - 1.27 mg/dL 9.62  9.52  8.41   Sodium 134 - 144 mmol/L 140  141  138   Potassium 3.5 - 5.2 mmol/L 4.4  5.4  3.9   Chloride 96 - 106 mmol/L 103  102  105   CO2 20 - 29 mmol/L 24  25  27    Calcium 8.6 - 10.2 mg/dL 9.4  32.4  9.2   Total Protein 6.0 - 8.5 g/dL 6.2  6.4  6.6   Total Bilirubin 0.0 - 1.2 mg/dL 0.6  0.6  0.8   Alkaline Phos 44 - 121 IU/L 93  106  88   AST 0 - 40 IU/L 16  20  20    ALT 0 - 44 IU/L 18  19  22      Imaging: Radiological images reviewed:   Within  last 24 hrs: No results found.  Assessment    Dermal cyst upper posterior right neck.  At least 2 cm in diameter. 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   Pilonidal cyst 04/04/2022   Annual physical exam 12/13/2021   Diabetes mellitus type 2 in obese 12/13/2021   Atypical chest pain 12/13/2021   Cellulitis 08/28/2021   Dermatitis due to plants, including poison ivy, sumac, and oak 08/28/2021   Recurrent AOM (acute otitis media) 01/25/2021   Caregiver stress 10/20/2017   BPH (benign prostatic hyperplasia) 10/21/2014   COPD, mild (HCC) 10/21/2014   Type 2 diabetes mellitus with microalbuminuria, without long-term current use of insulin (HCC) 10/21/2014   Failure of erection 10/21/2014   Hyperlipidemia associated with type 2 diabetes mellitus (HCC) 10/21/2014   Microalbuminuria due to type 2 diabetes mellitus (HCC) 10/21/2014   Obesity 10/21/2014   Allergic rhinitis 10/21/2014   Apnea, sleep 10/21/2014   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  Will have him follow-up in a week or more for excision.  Plan for excision under local anesthetic here in the office.  I believe he understands and desires to proceed.   Face-to-face time spent with  the patient and accompanying care providers(if present) was 15 minutes, with more than 50% of the time spent counseling, educating, and coordinating care of the patient.    These notes generated with voice recognition software. I apologize for typographical errors.  Campbell Lerner M.D., FACS 02/04/2023, 11:43 AM

## 2023-02-04 NOTE — Patient Instructions (Signed)
We want you to hold your Aspirin for 1 week prior to your excision.   We will do this under local anesthesia and you do not need a driver but may bring someone with you if you like.

## 2023-02-07 ENCOUNTER — Telehealth: Payer: Self-pay

## 2023-02-07 NOTE — Telephone Encounter (Signed)
Copied from CRM [id number]. Topic: General - Other >> Feb 07, 2023  2:57 PM Franchot Heidelberg wrote: Reason for CRM: Zel from [employer] called requesting to receive the clinical notes supporting the CPAP machine and that the patient benefits from using it. Please call, Zel has questions for the clinic  Best contact: [phone]

## 2023-02-07 NOTE — Telephone Encounter (Signed)
Please review. Patient needs to be seen? I don't see a office notes stating the benefit of using the cpap machine.

## 2023-02-10 NOTE — Telephone Encounter (Signed)
Yes needs appt to go over compliance and benefit of CPAP for continued coverage. Can be in person or virtual per patient preference.

## 2023-02-10 NOTE — Progress Notes (Unsigned)
***    Pre-operative Diagnosis: ***  Post-operative Diagnosis: same.  ***  Surgeon:  , M.D., FACS  Anesthesia: Local   Findings: ***  Estimated Blood Loss: *** mL         Specimens: ***          Complications: none              Procedure Details  The patient was evaluated, the benefits, complications, treatment options, and expected outcomes were discussed with the patient. The risks of bleeding, infection, recurrence of symptoms, failure to resolve symptoms, unanticipated injury, any of which could require further surgery were reviewed with the patient. The likelihood of improving the patient's symptoms with return to their baseline status is expected.  The patient and/or family concurred with the proposed plan, giving informed consent.  The patient was taken to our procedure room, identified and the procedure verified.    The patient was positioned in the *** position and the *** was prepped with *** Chloraprep and draped in the sterile fashion.  A Time Out was held and the above information confirmed.  ***  

## 2023-02-11 ENCOUNTER — Encounter: Payer: Self-pay | Admitting: Surgery

## 2023-02-11 ENCOUNTER — Ambulatory Visit: Payer: Medicare Other | Admitting: Surgery

## 2023-02-11 VITALS — BP 129/82 | HR 55 | Temp 98.0°F | Ht 67.0 in | Wt 205.0 lb

## 2023-02-11 DIAGNOSIS — L723 Sebaceous cyst: Secondary | ICD-10-CM

## 2023-02-11 NOTE — Patient Instructions (Signed)
We have removed a Cyst in our office today.  You have sutures under the skin that will dissolve and also dermabond (skin glue) on top of your skin which will come off on it's own in 10-14 days.  You may use Ibuprofen or Tylenol as needed for pain control. Use the ice pack 3-4 times a day for the next two days for any achiness.  Change the dressing tomorrow evening, you may just keep some triple ATB ointment on the area or cover the area if needed.  You may shower tomorrow evening, do not scrub at the area.  Avoid Strenuous activities that will make you sweat during the next 48 hours. Avoid activities that will place pressure to this area of the body for 1-2 weeks to avoid re-injury to incision site.  Please see your follow-up appointment provided. If you have any questions or concerns prior to this appointment, call our office and speak with a nurse.    Excision of Skin Cysts or Lesions Excision of a skin lesion refers to the removal of a section of skin by making small cuts (incisions) in the skin. This procedure may be done to remove a cancerous (malignant) or noncancerous (benign) growth on the skin. It is typically done to treat or prevent cancer or infection. It may also be done to improve cosmetic appearance. The procedure may be done to remove: Cancerous growths, such as basal cell carcinoma, squamous cell carcinoma, or melanoma. Noncancerous growths, such as a cyst or lipoma. Growths, such as moles or skin tags, which may be removed for cosmetic reasons.  Various excision or surgical techniques may be used depending on your condition, the location of the lesion, and your overall health. Tell a health care provider about: Any allergies you have. All medicines you are taking, including vitamins, herbs, eye drops, creams, and over-the-counter medicines. Any problems you or family members have had with anesthetic medicines. Any blood disorders you have. Any surgeries you have had. Any  medical conditions you have. Whether you are pregnant or may be pregnant. What are the risks? Generally, this is a safe procedure. However, problems may occur, including: Bleeding. Infection. Scarring. Recurrence of the cyst, lipoma, or cancer. Changes in skin sensation or appearance, such as discoloration or swelling. Reaction to the anesthetics. Allergic reaction to surgical materials or ointments. Damage to nerves, blood vessels, muscles, or other structures. Continued pain.  What happens before the procedure? Ask your health care provider about: Changing or stopping your regular medicines. This is especially important if you are taking diabetes medicines or blood thinners. Taking medicines such as aspirin and ibuprofen. These medicines can thin your blood. Do not take these medicines before your procedure if your health care provider instructs you not to. You may be asked to take certain medicines. You may be asked to stop smoking. You may have an exam or testing. What happens during the procedure? To reduce your risk of infection: Your health care team will wash or sanitize their hands. Your skin will be washed with soap. You will be given a medicine to numb the area (local anesthetic). One of the following excision techniques will be performed. At the end of any of these procedures, antibiotic ointment will be applied as needed. Each of the following techniques may vary among health care providers and hospitals. Complete Surgical Excision The area of skin that needs to be removed will be marked with a pen. Using a small scalpel or scissors, the surgeon will gently cut  around and under the lesion until it is completely removed. The lesion will be placed in a fluid and sent to the lab for examination. If necessary, bleeding will be controlled with a device that delivers heat (electrocautery). The edges of the wound may be stitched (sutured) together, and a bandage (dressing) or  surgical glue will be applied. This procedure may be performed to treat a cancerous growth or a noncancerous cyst or lesion.  What happens after the procedure? Return to your normal activities as told by your health care provider. Report any excessive bleeding, spreading redness, or increased pain.

## 2023-02-11 NOTE — Telephone Encounter (Signed)
Scheduled patient for Thursday at 2:20-video visit

## 2023-02-12 NOTE — Telephone Encounter (Signed)
Ron from [employer] has called to follow up on the request to obtain most recent office notes for patient that states patient is using CPAP machine and benefits from using it for the replacement CPAP machince order. Ron has been advised patient has an upcoming appointment tomorrow 10.10.2024 in regards to this request. Please advise.  [facility] Phone # [phone]  [facility] Fax # [phone]

## 2023-02-13 ENCOUNTER — Telehealth (INDEPENDENT_AMBULATORY_CARE_PROVIDER_SITE_OTHER): Payer: Medicare Other | Admitting: Family Medicine

## 2023-02-13 ENCOUNTER — Encounter: Payer: Self-pay | Admitting: Family Medicine

## 2023-02-13 ENCOUNTER — Ambulatory Visit: Payer: Medicare Other | Admitting: Surgery

## 2023-02-13 DIAGNOSIS — E669 Obesity, unspecified: Secondary | ICD-10-CM

## 2023-02-13 DIAGNOSIS — E1169 Type 2 diabetes mellitus with other specified complication: Secondary | ICD-10-CM | POA: Diagnosis not present

## 2023-02-13 DIAGNOSIS — G4733 Obstructive sleep apnea (adult) (pediatric): Secondary | ICD-10-CM | POA: Diagnosis not present

## 2023-02-13 DIAGNOSIS — Z7984 Long term (current) use of oral hypoglycemic drugs: Secondary | ICD-10-CM | POA: Diagnosis not present

## 2023-02-13 MED ORDER — LISINOPRIL 5 MG PO TABS: 5.0000 mg | ORAL_TABLET | Freq: Every day | ORAL | 3 refills | Status: AC

## 2023-02-13 MED ORDER — ATORVASTATIN CALCIUM 40 MG PO TABS: 40.0000 mg | ORAL_TABLET | Freq: Every day | ORAL | 3 refills | Status: DC

## 2023-02-13 MED ORDER — METFORMIN HCL 500 MG PO TABS
500.0000 mg | ORAL_TABLET | Freq: Every day | ORAL | 3 refills | Status: DC
Start: 2023-02-13 — End: 2023-07-24

## 2023-02-13 MED ORDER — TAMSULOSIN HCL 0.4 MG PO CAPS: 0.4000 mg | ORAL_CAPSULE | Freq: Every day | ORAL | 3 refills | Status: DC

## 2023-02-13 NOTE — Progress Notes (Signed)
MyChart Video Visit    Virtual Visit via Video Note   This format is felt to be most appropriate for this patient at this time. Physical exam was limited by quality of the video and audio technology used for the visit.    Patient location: home Provider location: [facility] Persons involved in the visit: patient, provider  I discussed the limitations of evaluation and management by telemedicine and the availability of in person appointments. The patient expressed understanding and agreed to proceed.  Patient: David [name].   DOB: 06-01-1945   77 y.o. Male  MRN: [id number] Visit Date: 02/13/2023  Today's healthcare provider: Shirlee Latch, MD   No chief complaint on file.  Subjective    HPI   Discussed the use of AI scribe software for clinical note transcription with the patient, who gave verbal consent to proceed.  History of Present Illness   The patient, with a history of sleep apnea, reports significant improvement in sleep quality with regular use of his auto-titrating CPAP machine. He notes a clear difference in his sleep quality and energy levels when he does not use the machine, such as when he falls asleep in a chair. He reports using the machine for more than four hours a night on average. The patient also notes that his Fitbit sleep quality scores improve when he uses the CPAP machine. He has had no issues with his mask and is reminded regularly about when to reorder supplies. However, he recently noticed a message on his machine indicating that the motor was failing. The machine is 78 years old, and he is in the process of getting a replacement through his insurance.         Review of Systems      Objective    There were no vitals taken for this visit.      Physical Exam Constitutional:      General: He is not in acute distress.    Appearance: Normal appearance. He is not diaphoretic.  HENT:     Head: Normocephalic.   Eyes:     Conjunctiva/sclera: Conjunctivae normal.  Pulmonary:     Effort: Pulmonary effort is normal. No respiratory distress.  Neurological:     Mental Status: He is alert and oriented to person, place, and time. Mental status is at baseline.        Assessment & Plan     Problem List Items Addressed This Visit       Respiratory   OSA (obstructive sleep apnea) - Primary     Endocrine   Type 2 diabetes mellitus with obesity (HCC)   Relevant Medications   atorvastatin (LIPITOR) 40 MG tablet   lisinopril (ZESTRIL) 5 MG tablet   metFORMIN (GLUCOPHAGE) 500 MG tablet       Obstructive Sleep Apnea Reports good compliance with CPAP and improvement in sleep quality. Auto-titrating CPAP machine indicating motor failure. -Continue current CPAP use. -Plan for replacement of CPAP machine due to motor failure.  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 History of multiple polyps on colonoscopy and family history of various cancers. -Continue with regular colonoscopy screenings as per guidelines for Lynch Syndrome. -Encourage family members to undergo genetic screening.  Follow-up in mid-February for routine check-up.          Return in about 4 months (around 06/16/2023) for chronic disease f/u.     I discussed the assessment and treatment plan with  the patient. The patient was provided an opportunity to ask questions and all were answered. The patient agreed with the plan and demonstrated an understanding of the instructions.   The patient was advised to call back or seek an in-person evaluation if the symptoms worsen or if the condition fails to improve as anticipated.   Shirlee Latch, MD [facility] [phone] (phone) [phone] (fax)  [facility]

## 2023-02-14 NOTE — Telephone Encounter (Signed)
Providers office note faxed to [phone] Attn: to Ron with [employer] [facility]

## 2023-02-17 ENCOUNTER — Encounter: Payer: Self-pay | Admitting: Family Medicine

## 2023-02-17 ENCOUNTER — Telehealth: Payer: Self-pay | Admitting: Licensed Clinical Social Worker

## 2023-02-17 NOTE — Telephone Encounter (Signed)
[facility] report is back and did identify PMS2 mutation we already knew about, the remainder of testing was normal. Report provided to patient, family members have up to 150 days from report date to access free testing.    Lacy Duverney, MS, [facility] Genetic Counselor [email or web address] Phone: [phone]

## 2023-02-19 NOTE — Progress Notes (Unsigned)
[facility] POST-OP OFFICE VISIT  02/20/2023  HPI: David [name]. is a 77 y.o. male 9 days s/p excision of ruptured dermal cyst of posterior neck.  Doing well. Spot of drainage on initial dressing, but none since.  No pain, no tenderness.   Vital signs: BP 133/71   Pulse 68   Temp 98 F (36.7 C)   Ht 5\' 7"  (1.702 m)   Wt 211 lb (95.7 kg)   SpO2 99%   BMI 33.05 kg/m    Physical Exam: Constitutional: appears well,   Skin: sutures removed, healing well.   Assessment/Plan: This is a 77 y.o. 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   Pilonidal cyst 04/04/2022   Type 2 diabetes mellitus with obesity (HCC) 12/13/2021   Atypical chest pain 12/13/2021   Cellulitis 08/28/2021   Dermatitis due to plants, including poison ivy, sumac, and oak 08/28/2021   Recurrent AOM (acute otitis media) 01/25/2021   Caregiver stress 10/20/2017   BPH (benign prostatic hyperplasia) 10/21/2014   COPD, mild (HCC) 10/21/2014   Type 2 diabetes mellitus with microalbuminuria, without long-term current use of insulin (HCC) 10/21/2014   Failure of erection 10/21/2014   Hyperlipidemia associated with type 2 diabetes mellitus (HCC) 10/21/2014   Microalbuminuria due to type 2 diabetes mellitus (HCC) 10/21/2014   Obesity 10/21/2014   Allergic rhinitis 10/21/2014   OSA (obstructive sleep apnea) 10/21/2014   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    Campbell Lerner M.D., FACS 02/20/2023, 9:14 AM

## 2023-02-20 ENCOUNTER — Ambulatory Visit: Payer: Medicare Other | Admitting: Surgery

## 2023-02-20 ENCOUNTER — Encounter: Payer: Self-pay | Admitting: Surgery

## 2023-02-20 ENCOUNTER — Telehealth: Payer: Self-pay

## 2023-02-20 VITALS — BP 133/71 | HR 68 | Temp 98.0°F | Ht 67.0 in | Wt 211.0 lb

## 2023-02-20 DIAGNOSIS — L723 Sebaceous cyst: Secondary | ICD-10-CM

## 2023-02-20 DIAGNOSIS — Z09 Encounter for follow-up examination after completed treatment for conditions other than malignant neoplasm: Secondary | ICD-10-CM

## 2023-02-20 NOTE — Patient Instructions (Signed)
   Follow-up with our office as needed.  Please call and ask to speak with a nurse if you develop questions or concerns.  

## 2023-02-20 NOTE — Telephone Encounter (Signed)
Signed and given to Joni Reining to fax back

## 2023-02-20 NOTE — Telephone Encounter (Signed)
Copied from CRM [id number]. Topic: General - Other >> Feb 20, 2023 11:26 AM Phill Myron wrote: Dorna Leitz  with [employer] [facility]...regarding office visit notes.Marland KitchenMarland KitchenOffice visit date is Octoer 10th and the electronic signature says October 9th...and the rx for the CPap should machine request should also be dated after the October 10th visit... please correct and resend  fax# [phone]...Marland KitchenMarland KitchenMarland Kitchen

## 2023-02-20 NOTE — Telephone Encounter (Signed)
Can someone find the order that was sent for me to addend with a later date and new signature please?

## 2023-02-25 DIAGNOSIS — K080 Exfoliation of teeth due to systemic causes: Secondary | ICD-10-CM | POA: Diagnosis not present

## 2023-02-25 NOTE — Telephone Encounter (Signed)
[facility] stated the Rx received does not indicate pressure settings and is missing updated office visit notes. Stated office visit notes need to state the patient is using and benefiting from CPAP therapy and must be dated 02/13/2023.  Stated they need Rx for CPAP machine pressure settings of 5 to 20.  Please advise.

## 2023-02-26 DIAGNOSIS — K080 Exfoliation of teeth due to systemic causes: Secondary | ICD-10-CM | POA: Diagnosis not present

## 2023-02-28 ENCOUNTER — Encounter: Payer: Self-pay | Admitting: Family Medicine

## 2023-03-06 ENCOUNTER — Other Ambulatory Visit: Payer: Self-pay | Admitting: Medical Genetics

## 2023-03-06 DIAGNOSIS — Z006 Encounter for examination for normal comparison and control in clinical research program: Secondary | ICD-10-CM

## 2023-03-11 DIAGNOSIS — G4733 Obstructive sleep apnea (adult) (pediatric): Secondary | ICD-10-CM | POA: Diagnosis not present

## 2023-03-12 ENCOUNTER — Other Ambulatory Visit
Admission: RE | Admit: 2023-03-12 | Discharge: 2023-03-12 | Disposition: A | Discharge status: Home / Self Care | Payer: Medicare Other | Source: Ambulatory Visit | Attending: Medical Genetics | Admitting: Medical Genetics

## 2023-03-12 DIAGNOSIS — Z006 Encounter for examination for normal comparison and control in clinical research program: Secondary | ICD-10-CM | POA: Insufficient documentation

## 2023-03-21 ENCOUNTER — Ambulatory Visit: Payer: Self-pay | Admitting: *Deleted

## 2023-03-21 ENCOUNTER — Encounter: Payer: Self-pay | Admitting: Family Medicine

## 2023-03-21 NOTE — Telephone Encounter (Signed)
Message from Enhaut T sent at 03/21/2023  2:55 PM EST  Summary: tetanus shot   Patient called stated he has a nasty cut on his left ankle and wants to see if he has had or need a tetanus shot. Please f/u with patient          Call History  Contact Date/Time Type Contact Phone/Fax User  03/21/2023 02:53 PM EST Phone (Incoming) Goodenow, Danella Sensing. "Smitty Cords (Self) [phone] Judie Petit) Elon Jester   Reason for Disposition  Minor cut or scratch  Answer Assessment - Initial Assessment Questions 1. APPEARANCE of INJURY: "What does the injury look like?"      I returned his call.   He was wanting to know when his last tetanus shot was.   He found the answer in his MyChart October 04, 2017.  Same date I was going to tell him from his chart.   Do I need a booster>   I Googled it and it said a booster at 5 years is not a bad idea with an injury. A 3 inch scratch from an edge on my utility trailer a few minutes ago.   I'm going in to wash it now and head to my job at [employer].   I let him know to wash it with soap and water gently and then put antibiotic ointment on it and cover it with a Band Aid.   "I will certainly do that"  2. SIZE: "How large is the cut?"      3 inches approximately.    3. BLEEDING: "Is it bleeding now?" If Yes, ask: "Is it difficult to stop?"      No    Not on blood thinners. 4. LOCATION: "Where is the injury located?"      My left ankle 5. ONSET: "How long ago did the injury occur?"      A few minutes ago 6. MECHANISM: "Tell me how it happened."      See above    Scrapped it on his utility trailer. 7. TETANUS: "When was the last tetanus booster?"     October 04, 2017 8. PREGNANCY: "Is there any chance you are pregnant?" "When was your last menstrual period?"     N/A  Protocols used: Cuts and Lacerations-A-AH

## 2023-03-21 NOTE — Telephone Encounter (Signed)
  Chief Complaint: Wants to know if he needs a tetanus booster if his last tetanus was October 04, 2017?   Symptoms: Scratched his left ankle approximately 3 inches on his utility trailer a few minutes ago.   Not bleeding now.   Not on blood thinners. Frequency: Today  Pertinent Negatives: Patient denies It being deep enough to need stitches. Disposition: [] ED /[] Urgent Care (no appt availability in office) / [] Appointment(In office/virtual)/ []  [place] [facility]/ [x] Home Care/ [] Refused Recommended Disposition /[] [place] [facility]/ [x]  Follow-up with PCP Additional Notes: Went over the home care advice.   I let him know I would send Dr. Beryle Flock a message to see if she felt he needed a tetanus booster or not.   He is ok with her answering via his MyChart because he is going to work in a few minutes.

## 2023-03-24 NOTE — Telephone Encounter (Signed)
Since last vaccine was >5 years ago and would classify this as a non-clean injury, do recommend that he gets TDaP booster.  Please advise him and schedule for nurse visit for vaccine.  Ok to double book a virtual or something if needed for nurse visit.

## 2023-03-24 NOTE — Telephone Encounter (Signed)
LVMTCB. CRM created. Ok for [facility] to advise and schedule patient.

## 2023-03-26 NOTE — Telephone Encounter (Signed)
Per the patient he has already gotten the tetanus shot at the pharmacy.

## 2023-03-31 DIAGNOSIS — D692 Other nonthrombocytopenic purpura: Secondary | ICD-10-CM | POA: Diagnosis not present

## 2023-03-31 DIAGNOSIS — J309 Allergic rhinitis, unspecified: Secondary | ICD-10-CM | POA: Diagnosis not present

## 2023-04-10 DIAGNOSIS — G4733 Obstructive sleep apnea (adult) (pediatric): Secondary | ICD-10-CM | POA: Diagnosis not present

## 2023-04-12 DIAGNOSIS — G4733 Obstructive sleep apnea (adult) (pediatric): Secondary | ICD-10-CM | POA: Diagnosis not present

## 2023-05-01 ENCOUNTER — Encounter: Payer: Self-pay | Admitting: Family Medicine

## 2023-05-05 DIAGNOSIS — H43813 Vitreous degeneration, bilateral: Secondary | ICD-10-CM | POA: Diagnosis not present

## 2023-05-05 DIAGNOSIS — H5703 Miosis: Secondary | ICD-10-CM | POA: Diagnosis not present

## 2023-05-05 DIAGNOSIS — H2513 Age-related nuclear cataract, bilateral: Secondary | ICD-10-CM | POA: Diagnosis not present

## 2023-05-05 DIAGNOSIS — E119 Type 2 diabetes mellitus without complications: Secondary | ICD-10-CM | POA: Diagnosis not present

## 2023-05-05 LAB — HM DIABETES EYE EXAM

## 2023-05-11 DIAGNOSIS — G4733 Obstructive sleep apnea (adult) (pediatric): Secondary | ICD-10-CM | POA: Diagnosis not present

## 2023-05-13 DIAGNOSIS — G4733 Obstructive sleep apnea (adult) (pediatric): Secondary | ICD-10-CM | POA: Diagnosis not present

## 2023-05-14 ENCOUNTER — Ambulatory Visit
Admission: RE | Admit: 2023-05-14 | Discharge: 2023-05-14 | Disposition: A | Discharge status: Home / Self Care | Payer: Medicare Other | Attending: Internal Medicine | Admitting: Internal Medicine

## 2023-05-14 ENCOUNTER — Encounter
Admission: RE | Disposition: A | Discharge status: Home / Self Care | Payer: Self-pay | Source: Home / Self Care | Attending: Internal Medicine

## 2023-05-14 ENCOUNTER — Ambulatory Visit: Payer: Medicare Other | Admitting: Anesthesiology

## 2023-05-14 DIAGNOSIS — J449 Chronic obstructive pulmonary disease, unspecified: Secondary | ICD-10-CM | POA: Diagnosis not present

## 2023-05-14 DIAGNOSIS — Z1210 Encounter for screening for malignant neoplasm of intestinal tract, unspecified: Secondary | ICD-10-CM | POA: Diagnosis not present

## 2023-05-14 DIAGNOSIS — Z7984 Long term (current) use of oral hypoglycemic drugs: Secondary | ICD-10-CM | POA: Diagnosis not present

## 2023-05-14 DIAGNOSIS — K5730 Diverticulosis of large intestine without perforation or abscess without bleeding: Secondary | ICD-10-CM | POA: Diagnosis not present

## 2023-05-14 DIAGNOSIS — Z148 Genetic carrier of other disease: Secondary | ICD-10-CM | POA: Diagnosis not present

## 2023-05-14 DIAGNOSIS — Z1509 Genetic susceptibility to other malignant neoplasm: Secondary | ICD-10-CM | POA: Diagnosis not present

## 2023-05-14 DIAGNOSIS — I2510 Atherosclerotic heart disease of native coronary artery without angina pectoris: Secondary | ICD-10-CM | POA: Insufficient documentation

## 2023-05-14 DIAGNOSIS — F17200 Nicotine dependence, unspecified, uncomplicated: Secondary | ICD-10-CM | POA: Insufficient documentation

## 2023-05-14 DIAGNOSIS — K2970 Gastritis, unspecified, without bleeding: Secondary | ICD-10-CM | POA: Diagnosis not present

## 2023-05-14 DIAGNOSIS — G4733 Obstructive sleep apnea (adult) (pediatric): Secondary | ICD-10-CM | POA: Insufficient documentation

## 2023-05-14 DIAGNOSIS — Z120 Encounter for screening for malignant neoplasm of stomach: Secondary | ICD-10-CM | POA: Insufficient documentation

## 2023-05-14 DIAGNOSIS — K641 Second degree hemorrhoids: Secondary | ICD-10-CM | POA: Diagnosis not present

## 2023-05-14 DIAGNOSIS — Z1211 Encounter for screening for malignant neoplasm of colon: Secondary | ICD-10-CM | POA: Insufficient documentation

## 2023-05-14 DIAGNOSIS — E119 Type 2 diabetes mellitus without complications: Secondary | ICD-10-CM | POA: Insufficient documentation

## 2023-05-14 DIAGNOSIS — Z860101 Personal history of adenomatous and serrated colon polyps: Secondary | ICD-10-CM | POA: Diagnosis not present

## 2023-05-14 DIAGNOSIS — Z09 Encounter for follow-up examination after completed treatment for conditions other than malignant neoplasm: Secondary | ICD-10-CM | POA: Diagnosis not present

## 2023-05-14 DIAGNOSIS — K648 Other hemorrhoids: Secondary | ICD-10-CM | POA: Diagnosis not present

## 2023-05-14 DIAGNOSIS — Z800 Family history of malignant neoplasm of digestive organs: Secondary | ICD-10-CM | POA: Diagnosis not present

## 2023-05-14 DIAGNOSIS — K642 Third degree hemorrhoids: Secondary | ICD-10-CM | POA: Diagnosis not present

## 2023-05-14 DIAGNOSIS — E7800 Pure hypercholesterolemia, unspecified: Secondary | ICD-10-CM | POA: Insufficient documentation

## 2023-05-14 HISTORY — PX: ESOPHAGOGASTRODUODENOSCOPY (EGD) WITH PROPOFOL: SHX5813

## 2023-05-14 HISTORY — PX: COLONOSCOPY WITH PROPOFOL: SHX5780

## 2023-05-14 LAB — GLUCOSE, CAPILLARY: Glucose-Capillary: 194 mg/dL — ABNORMAL HIGH (ref 70–99)

## 2023-05-14 SURGERY — COLONOSCOPY WITH PROPOFOL
Anesthesia: General

## 2023-05-14 MED ORDER — PROPOFOL 500 MG/50ML IV EMUL
INTRAVENOUS | Status: DC | PRN
  Administered 2023-05-14: 75 ug/kg/min via INTRAVENOUS

## 2023-05-14 MED ORDER — DEXMEDETOMIDINE HCL IN NACL 80 MCG/20ML IV SOLN
INTRAVENOUS | Status: DC | PRN
  Administered 2023-05-14: 20 ug via INTRAVENOUS

## 2023-05-14 MED ORDER — SODIUM CHLORIDE 0.9 % IV SOLN
INTRAVENOUS | Status: DC
  Administered 2023-05-14: 20 mL/h via INTRAVENOUS

## 2023-05-14 MED ORDER — EPHEDRINE 5 MG/ML INJ
INTRAVENOUS | Status: AC
Start: 2023-05-14 — End: ?
  Filled 2023-05-14: qty 5

## 2023-05-14 MED ORDER — EPHEDRINE SULFATE-NACL 50-0.9 MG/10ML-% IV SOSY
PREFILLED_SYRINGE | INTRAVENOUS | Status: DC | PRN
  Administered 2023-05-14: 15 mg via INTRAVENOUS
  Administered 2023-05-14: 10 mg via INTRAVENOUS

## 2023-05-14 MED ORDER — LIDOCAINE HCL (PF) 2 % IJ SOLN
INTRAMUSCULAR | Status: AC
Start: 2023-05-14 — End: ?
  Filled 2023-05-14: qty 5

## 2023-05-14 MED ORDER — PROPOFOL 10 MG/ML IV BOLUS
INTRAVENOUS | Status: DC | PRN
  Administered 2023-05-14: 50 mg via INTRAVENOUS

## 2023-05-14 MED ORDER — LIDOCAINE HCL (CARDIAC) PF 100 MG/5ML IV SOSY
PREFILLED_SYRINGE | INTRAVENOUS | Status: DC | PRN
  Administered 2023-05-14: 100 mg via INTRAVENOUS

## 2023-05-14 NOTE — Op Note (Addendum)
 [facility] Patient Name: David [name] Procedure Date: 05/14/2023 9:31 AM MRN: [id number] Account #: [id number] Date of Birth: 06/14/1945 Admit Type: Outpatient Age: 78 Room: [facility] ENDO ROOM 2 Gender: Male Note Status: Supervisor Override Instrument Name: Arvis [id number] Procedure:             Colonoscopy Indications:           Screening in patient at increased risk: Family history                         of 1st-degree relative with colorectal cancer, High                         risk colon cancer surveillance: Personal history of                         multiple (3 or more) adenomas, Lynch Syndrome Providers:             Talaysia Pinheiro K. Aundria MD, MD Referring MD:          Janika Jedlicka K. Aundria MD, MD (Referring MD) Medicines:             Propofol  per Anesthesia Complications:         No immediate complications. Estimated blood loss: None. Procedure:             Pre-Anesthesia Assessment:                        - The risks and benefits of the procedure and the                         sedation options and risks were discussed with the                         patient. All questions were answered and informed                         consent was obtained.                        - Patient identification and proposed procedure were                         verified prior to the procedure by the nurse. The                         procedure was verified in the procedure room.                        - ASA Grade Assessment: III - A patient with severe                         systemic disease.                        - After reviewing the risks and benefits, the patient                         was deemed in satisfactory condition to undergo the  procedure.                        After obtaining informed consent, the colonoscope was                         passed under direct vision. Throughout the procedure,                         the  patient's blood pressure, pulse, and oxygen                         saturations were monitored continuously. The                         Colonoscope was introduced through the anus and                         advanced to the the cecum, identified by appendiceal                         orifice and ileocecal valve. The colonoscopy was                         performed without difficulty. The patient tolerated                         the procedure well. The quality of the bowel                         preparation was adequate. The ileocecal valve,                         appendiceal orifice, and rectum were photographed. Findings:      The perianal exam findings include internal hemorrhoids that prolapse       with straining, but require manual replacement into the anal canal       (Grade III).      Non-bleeding internal hemorrhoids were found during retroflexion. The       hemorrhoids were Grade II (internal hemorrhoids that prolapse but reduce       spontaneously).      Many medium-mouthed and small-mouthed diverticula were found in the       entire colon. There was no evidence of diverticular bleeding.      The exam was otherwise without abnormality. Impression:            - Internal hemorrhoids that prolapse with straining,                         but require manual replacement into the anal canal                         (Grade III) found on perianal exam.                        - Non-bleeding internal hemorrhoids.                        - Mild diverticulosis in the entire examined colon.  There was no evidence of diverticular bleeding.                        - The examination was otherwise normal.                        - No specimens collected. Recommendation:        - Patient has a contact number available for                         emergencies. The signs and symptoms of potential                         delayed complications were discussed with the patient.                          Return to normal activities tomorrow. Written                         discharge instructions were provided to the patient.                        - Resume previous diet.                        - Continue present medications.                        - Repeat colonoscopy in 3 years for screening purposes.                        - Return to GI office in 1 year.                        - The findings and recommendations were discussed with                         the patient. Procedure Code(s):     --- Professional ---                        H9894, Colorectal cancer screening; colonoscopy on                         individual at high risk Diagnosis Code(s):     --- Professional ---                        K57.30, Diverticulosis of large intestine without                         perforation or abscess without bleeding                        K64.2, Third degree hemorrhoids                        Z80.0, Family history of malignant neoplasm of                         digestive organs CPT copyright 2022 American Medical Association. All  rights reserved. The codes documented in this report are preliminary and upon coder review may  be revised to meet current compliance requirements. Ladell MARLA Boss MD, MD 05/14/2023 10:09:17 AM This report has been signed electronically. Number of Addenda: 0 Note Initiated On: 05/14/2023 9:31 AM Scope Withdrawal Time: 0 hours 6 minutes 53 seconds  Total Procedure Duration: 0 hours 11 minutes 35 seconds  Estimated Blood Loss:  Estimated blood loss: none.      [facility]

## 2023-05-14 NOTE — Interval H&P Note (Signed)
 History and Physical Interval Note:  05/14/2023 9:26 AM  David [name].  has presented today for surgery, with the diagnosis of V12.72 (ICD-9-CM) - Z86.010 (ICD-10-CM) - Hx of adenomatous polyp of colonV16.0 (ICD-9-CM) - Z80.0 (ICD-10-CM) - FH: colon cancerV83.89 (ICD-9-CM) - Z14.8 (ICD-10-CM) - Carrier of gene for Lynch syndromeV16.0 (ICD-9-CM) - Z80.0 (ICD-10-CM) - Family history of Lynch syndrome.  The various methods of treatment have been discussed with the patient and family. After consideration of risks, benefits and other options for treatment, the patient has consented to  Procedure(s): COLONOSCOPY WITH PROPOFOL  (N/A) ESOPHAGOGASTRODUODENOSCOPY (EGD) WITH PROPOFOL  (N/A) as a surgical intervention.  The patient's history has been reviewed, patient examined, no change in status, stable for surgery.  I have reviewed the patient's chart and labs.  Questions were answered to the patient's satisfaction.     [name], David [name]

## 2023-05-14 NOTE — H&P (Signed)
 Outpatient short stay form Pre-procedure 05/14/2023 9:25 AM David [name] K. Aundria, M.D.  Primary Physician: Jon Eva, M.D>  Reason for visit:  Lynch Syndrome  History of present illness:  [name] is a 78 year old male with history of type 2 diabetes mellitus, hypercholesterolemia, COPD, OSA on CPAP, pilonidal cyst excision (06/2022) follow-up comes in for follow-up colonoscopy for personal history of colon polyps and personal history of Lynch syndrome as he was tested by his insurance company. He denies any upper or lower GI complaints. His most recent colonoscopy was performed 2020. He has normal bowel habits.No weight loss, anorexia, abdominal pain, change in bowel habits, or hematochezia. No prior EGD. No past history of HP. No chest pain, dysphagia, reflux, regurgitation, epigastric pain, nausea/vomiting, or melena.      Current Facility-Administered Medications:    0.9 %  sodium chloride  infusion, , Intravenous, Continuous, Chacra, Julian Medina K, MD, Last Rate: 20 mL/hr at 05/14/23 0824, 20 mL/hr at 05/14/23 0824  Medications Prior to Admission  Medication Sig Dispense Refill Last Dose/Taking   aspirin  EC 81 MG tablet Take 1 tablet (81 mg total) by mouth daily. Swallow whole. 90 tablet 3 Past Week   atorvastatin  (LIPITOR) 40 MG tablet Take 1 tablet (40 mg total) by mouth daily. 90 tablet 3 Past Week   Blood Glucose Monitoring Suppl (ONE TOUCH ULTRA 2) w/Device KIT Use as directed 1 kit 0 Past Week   fluticasone  (FLONASE ) 50 MCG/ACT nasal spray Place 2 sprays into both nostrils daily. 48 g 0 Past Week   glucose blood (ONETOUCH ULTRA TEST) test strip Use as instructed to test blood sugar once daily 100 each 12 Past Week   lisinopril  (ZESTRIL ) 5 MG tablet Take 1 tablet (5 mg total) by mouth daily. 90 tablet 3 Past Week   loratadine  (CLARITIN ) 10 MG tablet Take 1 tablet (10 mg total) by mouth daily. 90 tablet 3 Past Week   meloxicam  (MOBIC ) 15 MG tablet Take 1 tablet (15 mg total) by  mouth as needed. (Patient taking differently: Take 15 mg by mouth as needed. Rarely needs) 90 tablet 3 Past Week   metFORMIN  (GLUCOPHAGE ) 500 MG tablet Take 1 tablet (500 mg total) by mouth at bedtime. 90 tablet 3 Past Week   Multiple Vitamin (MULTIVITAMIN ADULT PO) Take by mouth.   Past Week   tamsulosin  (FLOMAX ) 0.4 MG CAPS capsule Take 1 capsule (0.4 mg total) by mouth daily. 90 capsule 3 Past Week   nitroGLYCERIN  (NITROSTAT ) 0.4 MG SL tablet Place 1 tablet (0.4 mg total) under the tongue every 5 (five) minutes as needed for chest pain. You can repeat 3 time - after the 3rd time you will need to call 911 90 tablet 3    OneTouch Delica Lancets 33G MISC Use as directed 100 each 1      Allergies  Allergen Reactions   Alcohol Other (See Comments)    Addiction     Past Medical History:  Diagnosis Date   Actinic keratosis 01/13/2017   R upper back post base of neck - biopsy proven    Adenomatous colon polyp    Allergy    Aortic atherosclerosis (HCC)    Atypical chest pain    BPH (benign prostatic hyperplasia)    CAD (coronary artery disease)    a.) CT chest 07/26/2021: 2v CAD; b.) cCTA 02/18/2022: CA score 159 (38th percentile for age/sex match); 25-49% pLAD and RCA distribution.   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 Atypical squamous nests are concerning for invasive SCC. MOHs 09/11/2020   T2DM  (type 2 diabetes mellitus) (HCC)     Review of systems:  Otherwise negative.    Physical Exam  Gen: Alert, oriented. Appears stated age.  HEENT: [place]/AT. PERRLA. Lungs: CTA, no wheezes. CV: RR nl S1, S2. Abd: soft, benign, no masses. BS+ Ext: No edema. Pulses 2+    Planned procedures: Proceed with EGD and colonoscopy. The patient understands the nature of the planned procedure, indications, risks, alternatives and potential complications including but not limited to bleeding, infection, perforation, damage to internal organs and possible oversedation/side effects from anesthesia. The patient agrees and gives consent to proceed.  Please refer to procedure notes for findings, recommendations and patient disposition/instructions.     [name] K. Aundria, M.D. Gastroenterology 05/14/2023  9:25 AM

## 2023-05-14 NOTE — Anesthesia Preprocedure Evaluation (Addendum)
 Anesthesia Evaluation  Patient identified by MRN, date of birth, ID band Patient awake    Reviewed: Allergy & Precautions, NPO status , Patient's Chart, lab work & pertinent test results  History of Anesthesia Complications Negative for: history of anesthetic complications  Airway Mallampati: III  TM Distance: <3 FB Neck ROM: full    Dental  (+) Chipped, Upper Dentures   Pulmonary sleep apnea , COPD, Current Smoker   Pulmonary exam normal        Cardiovascular Exercise Tolerance: Good hypertension, (-) angina + CAD  Normal cardiovascular exam     Neuro/Psych  PSYCHIATRIC DISORDERS      negative neurological ROS     GI/Hepatic negative GI ROS, Neg liver ROS,neg GERD  ,,  Endo/Other  diabetes    Renal/GU Renal disease  negative genitourinary   Musculoskeletal   Abdominal   Peds  Hematology negative hematology ROS (+)   Anesthesia Other Findings Past Medical History: 01/13/2017: Actinic keratosis     Comment:  R upper back post base of neck - biopsy proven  No date: Adenomatous colon polyp No date: Allergy No date: Aortic atherosclerosis (HCC) No date: Atypical chest pain No date: BPH (benign prostatic hyperplasia) No date: CAD (coronary artery disease)     Comment:  a.) CT chest 07/26/2021: 2v CAD; b.) cCTA 02/18/2022: CA              score 159 (38th percentile for age/sex match); 25-49%               pLAD and RCA distribution. No date: COPD (chronic obstructive pulmonary disease) (HCC) No date: Depression 04/09/2022: Diastolic dysfunction     Comment:  a.) TTE 04/09/2022: EF 60-65%, mild LVH, AoV sclerosis,               G1DD 12/23/2007: Dysplastic nevus     Comment:  R med mid post thigh - slight atypia 12/22/2008: Dysplastic nevus     Comment:  L mid ant thigh - slight atypia 05/10/2020: Dysplastic nevus     Comment:  RUQA lat, severia atypia, excised 07/11/20 No date: Hyperlipidemia No date:  Hypertension 1988: Melanoma (HCC)     Comment:  MM - clavicle, txted at [facility] No date: Microalbuminuria No date: OSA on CPAP No date: Pilonidal disease 07/18/2020: Squamous cell carcinoma of skin     Comment:  nasal tip - Aypical Squamous Proliferation with               Desmoplasia. Atypical squamous nests are concerning for               invasive SCC. MOHs 09/11/2020 No date: T2DM (type 2 diabetes mellitus) (HCC)  Past Surgical History: No date: broken right arm     Comment:  in seventh grade 2001: CHOLECYSTECTOMY 05/08/2018: COLONOSCOPY WITH PROPOFOL ; N/A     Comment:  Procedure: COLONOSCOPY WITH PROPOFOL ;  Surgeon: Viktoria Lamar DASEN, MD;  Location[facility] ENDOSCOPY;  Service:               Endoscopy;  Laterality: N/A; 10/30/2020: COLONOSCOPY WITH PROPOFOL ; N/A     Comment:  Procedure: COLONOSCOPY WITH PROPOFOL ;  Surgeon: [place],               Ladell POUR, MD;  Location: [facility] ENDOSCOPY;  Service:               Gastroenterology;  Laterality: N/A; 1998: MELANOMA  EXCISION 05/13/2022: PILONIDAL CYST EXCISION; N/A     Comment:  Procedure: CYST EXCISION PILONIDAL EXTENSIVE;  Surgeon:               Lane Shope, MD;  Location: [facility] ORS;  Service:               General;  Laterality: N/A; 1953: TONSILLECTOMY AND ADENOIDECTOMY  BMI    Body Mass Index: 32.42 kg/m      Reproductive/Obstetrics negative OB ROS                             Anesthesia Physical Anesthesia Plan  ASA: 3  Anesthesia Plan: General   Post-op Pain Management:    Induction: Intravenous  PONV Risk Score and Plan: Propofol  infusion and TIVA  Airway Management Planned: Natural Airway and Nasal Cannula  Additional Equipment:   Intra-op Plan:   Post-operative Plan:   Informed Consent: I have reviewed the patients History and Physical, chart, labs and discussed the procedure including the risks, benefits and alternatives for the proposed anesthesia with the patient or  authorized representative who has indicated his/her understanding and acceptance.     Dental Advisory Given  Plan Discussed with: Anesthesiologist, CRNA and Surgeon  Anesthesia Plan Comments: (Patient consented for risks of anesthesia including but not limited to:  - adverse reactions to medications - risk of airway placement if required - damage to eyes, teeth, lips or other oral mucosa - nerve damage due to positioning  - sore throat or hoarseness - Damage to heart, brain, nerves, lungs, other parts of body or loss of life  Patient voiced understanding and assent.)       Anesthesia Quick Evaluation

## 2023-05-14 NOTE — Anesthesia Postprocedure Evaluation (Signed)
 Anesthesia Post Note  Patient: David [name].  Procedure(s) Performed: COLONOSCOPY WITH PROPOFOL  ESOPHAGOGASTRODUODENOSCOPY (EGD) WITH PROPOFOL   Patient location during evaluation: Endoscopy Anesthesia Type: General Level of consciousness: awake and alert Pain management: pain level controlled Vital Signs Assessment: post-procedure vital signs reviewed and stable Respiratory status: spontaneous breathing, nonlabored ventilation, respiratory function stable and patient connected to nasal cannula oxygen Cardiovascular status: blood pressure returned to baseline and stable Postop Assessment: no apparent nausea or vomiting Anesthetic complications: no   No notable events documented.   Last Vitals:  Vitals:   05/14/23 1015 05/14/23 1025  BP: 127/63 124/65  Pulse: 73 68  Resp: 18 20  Temp:    SpO2: 98% 97%    Last Pain:  Vitals:   05/14/23 1025  TempSrc:   PainSc: 0-No pain                 Fairy POUR [place]

## 2023-05-14 NOTE — Transfer of Care (Signed)
 Immediate Anesthesia Transfer of Care Note  Patient: David [name].  Procedure(s) Performed: COLONOSCOPY WITH PROPOFOL  ESOPHAGOGASTRODUODENOSCOPY (EGD) WITH PROPOFOL   Patient Location: PACU  Anesthesia Type:General  Level of Consciousness: sedated  Airway & Oxygen Therapy: Patient Spontanous Breathing  Post-op Assessment: Report given to RN and Post -op Vital signs reviewed and stable  Post vital signs: Reviewed and stable  Last Vitals:  Vitals Value Taken Time  BP 93/36 05/14/23 1005  Temp    Pulse 73 05/14/23 1006  Resp 10 05/14/23 1006  SpO2 94 % 05/14/23 1006  Vitals shown include unfiled device data.  Last Pain:  Vitals:   05/14/23 0800  TempSrc: Temporal  PainSc: 2          Complications: No notable events documented.

## 2023-05-14 NOTE — Op Note (Addendum)
 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 Aundria MD, MD Referring MD:          Darleene Cumpian K. Aundria MD, MD (Referring MD) Medicines:             Propofol  per Anesthesia Complications:         No immediate complications. Estimated blood loss: None. Procedure:             Pre-Anesthesia Assessment:                        - The risks and benefits of the procedure and the                         sedation options and risks were discussed with the                         patient. All questions were answered and informed                         consent was obtained.                        - Patient identification and proposed procedure were                         verified prior to the procedure by the nurse. The                         procedure was verified in the procedure room.                        - ASA Grade Assessment: III - A patient with severe                         systemic disease.                        - After reviewing the risks and benefits, the patient                         was deemed in satisfactory condition to undergo the                         procedure.                        After obtaining informed consent, the endoscope was                         passed under direct vision. Throughout the procedure,  the patient's blood pressure, pulse, and oxygen                         saturations were monitored continuously. The                         Endosonoscope was introduced through the mouth, and                          advanced to the third part of duodenum. The upper GI                         endoscopy was accomplished without difficulty. The                         patient tolerated the procedure well. Findings:      The esophagus was normal.      Patchy mild inflammation characterized by congestion (edema) and       erythema was found in the entire examined stomach.      The exam of the stomach was otherwise normal.      The examined duodenum was normal. Impression:            - Normal esophagus.                        - Gastritis.                        - Normal examined duodenum.                        - No specimens collected. Recommendation:        - Repeat upper endoscopy in 3 years for screening                         purposes.                        - Proceed with colonoscopy Procedure Code(s):     --- Professional ---                        [phone], Esophagogastroduodenoscopy, flexible,                         transoral; diagnostic, including collection of                         specimen(s) by brushing or washing, when performed                         (separate procedure) Diagnosis Code(s):     --- Professional ---                        Z15.09, Genetic susceptibility to other malignant                         neoplasm                        K29.70, Gastritis, unspecified, without bleeding CPT copyright 2022 American  Medical Association. All rights reserved. The codes documented in this report are preliminary and upon coder review may  be revised to meet current compliance requirements. Ladell MARLA Boss MD, MD 05/14/2023 9:46:48 AM This report has been signed electronically. Number of Addenda: 0 Note Initiated On: 05/14/2023 9:36 AM Estimated Blood Loss:  Estimated blood loss: none.      [facility]

## 2023-05-15 ENCOUNTER — Encounter: Payer: Self-pay | Admitting: Internal Medicine

## 2023-05-15 ENCOUNTER — Ambulatory Visit: Payer: Medicare Other | Admitting: Family Medicine

## 2023-05-15 DIAGNOSIS — H65111 Acute and subacute allergic otitis media (mucoid) (sanguinous) (serous), right ear: Secondary | ICD-10-CM | POA: Diagnosis not present

## 2023-05-15 DIAGNOSIS — H90A31 Mixed conductive and sensorineural hearing loss, unilateral, right ear with restricted hearing on the contralateral side: Secondary | ICD-10-CM | POA: Diagnosis not present

## 2023-05-28 LAB — GENECONNECT MOLECULAR SCREEN: Genetic Analysis Overall Interpretation: POSITIVE — AB

## 2023-05-29 ENCOUNTER — Telehealth: Payer: Self-pay | Admitting: Medical Genetics

## 2023-05-29 NOTE — Telephone Encounter (Signed)
[facility] 05/29/2023 4:06 PM  FIRST ATTEMPT: Confirmed I was speaking with David [name]. [id number] by using name and DOB. Informed participant the reason for this call is to provide results for the above study. Results revealed Lynch Syndrome. Genetic counseling was offered and participant declined as he is aware of this finding from previous genetic testing and counseling. All questions were answered, and participant was thanked for their time and support of the above study. Participant was encouraged to contact [facility] if they have any further questions or concerns.

## 2023-06-04 DIAGNOSIS — K080 Exfoliation of teeth due to systemic causes: Secondary | ICD-10-CM | POA: Diagnosis not present

## 2023-06-05 DIAGNOSIS — H6981 Other specified disorders of Eustachian tube, right ear: Secondary | ICD-10-CM | POA: Diagnosis not present

## 2023-06-05 DIAGNOSIS — J018 Other acute sinusitis: Secondary | ICD-10-CM | POA: Diagnosis not present

## 2023-06-07 ENCOUNTER — Encounter: Payer: Self-pay | Admitting: Family Medicine

## 2023-06-10 ENCOUNTER — Encounter: Payer: Self-pay | Admitting: Family Medicine

## 2023-06-10 ENCOUNTER — Telehealth (INDEPENDENT_AMBULATORY_CARE_PROVIDER_SITE_OTHER): Payer: Medicare Other | Admitting: Family Medicine

## 2023-06-10 DIAGNOSIS — E785 Hyperlipidemia, unspecified: Secondary | ICD-10-CM

## 2023-06-10 DIAGNOSIS — E1129 Type 2 diabetes mellitus with other diabetic kidney complication: Secondary | ICD-10-CM

## 2023-06-10 DIAGNOSIS — E1169 Type 2 diabetes mellitus with other specified complication: Secondary | ICD-10-CM | POA: Diagnosis not present

## 2023-06-10 DIAGNOSIS — R809 Proteinuria, unspecified: Secondary | ICD-10-CM | POA: Diagnosis not present

## 2023-06-10 DIAGNOSIS — G4733 Obstructive sleep apnea (adult) (pediatric): Secondary | ICD-10-CM

## 2023-06-10 MED ORDER — MELOXICAM 15 MG PO TABS: 15.0000 mg | ORAL_TABLET | ORAL | 3 refills | Status: AC | PRN

## 2023-06-10 NOTE — Assessment & Plan Note (Signed)
On lisinopril 5 mg daily for kidney protection. No changes needed. - Continue lisinopril 5 mg daily

## 2023-06-10 NOTE — Progress Notes (Signed)
MyChart Video Visit    Virtual Visit via Video Note   This format is felt to be most appropriate for this patient at this time. Physical exam was limited by quality of the video and audio technology used for the visit.    Patient location: home Provider location: [facility] Persons involved in the visit: patient, provider  I discussed the limitations of evaluation and management by telemedicine and the availability of in person appointments. The patient expressed understanding and agreed to proceed.  Patient: David [name].   DOB: 09-Jan-1946   78 y.o. Male  MRN: [id number] Visit Date: 06/10/2023  Today's healthcare provider: Shirlee Latch, MD   No chief complaint on file.  Subjective    HPI   Discussed the use of AI scribe software for clinical note transcription with the patient, who gave verbal consent to proceed.  History of Present Illness   The patient, with a history of sleep apnea, presents for a follow-up visit regarding his CPAP machine. He started using the CPAP machine in mid-November and has been using it regularly, averaging about seven hours per night. He reports a noticeable difference in how he feels when he does not use the machine, indicating that it is helping. He has had no issues with the machine or the mask, which is the same model he was using before his previous machine broke. He has not received any bills or notifications regarding insurance coverage for the machine.  In addition to the sleep apnea, the patient also has a history of diabetes and is on metformin 500mg  at bedtime. He also takes lisinopril 5mg  a day for kidney protection and Lipitor 40mg  a day for cholesterol. He recently had an issue with pressure build-up in his right ear, which resolved on its own but was followed by a nasal infection. He is currently on antibiotics for the nasal infection. He also has a prescription for meloxicam for as-needed use, which he rarely  uses but would like to renew.       Review of Systems      Objective    There were no vitals taken for this visit.      Physical Exam Constitutional:      General: He is not in acute distress.    Appearance: Normal appearance. He is not diaphoretic.  HENT:     Head: Normocephalic.  Eyes:     Conjunctiva/sclera: Conjunctivae normal.  Pulmonary:     Effort: Pulmonary effort is normal. No respiratory distress.  Neurological:     Mental Status: He is alert and oriented to person, place, and time. Mental status is at baseline.        Assessment & Plan     Problem List Items Addressed This Visit       Respiratory   OSA (obstructive sleep apnea) - Primary   Using CPAP regularly, averaging closer to seven hours per night, with significant symptom improvement. No issues with the mask or device. Insurance compliance requirements are met.  - Document CPAP usage and effectiveness for insurance purposes - Continue current CPAP therapy        Endocrine   Type 2 diabetes mellitus with microalbuminuria, without long-term current use of insulin (HCC)   Last A1c in August 2024. Routine follow-up for A1c is due. Agrees to A1c and kidney function tests now and follow-up in August for physical exam. - Order A1c test - Order kidney function test  Relevant Orders   Hemoglobin A1c   Basic Metabolic Panel (BMET)   Hyperlipidemia associated with type 2 diabetes mellitus (HCC)   On Lipitor 40 mg daily. Last cholesterol check was good. Next check scheduled for August physical. - Continue Lipitor 40 mg daily - Check cholesterol at next physical in August      Microalbuminuria due to type 2 diabetes mellitus (HCC)   On lisinopril 5 mg daily for kidney protection. No changes needed. - Continue lisinopril 5 mg daily           Osteoarthritis Uses meloxicam as needed. Last prescription filled in January 2023 and is now expired. Prefers to have medication available for  occasional use. - Renew meloxicam prescription  General Health Maintenance Routine health maintenance and screenings are up to date. Physical exam due in August 2025. - Schedule physical exam for August 2025  Follow-up - Follow up with lab results - Schedule follow-up appointment for physical exam in August 2025.       Meds ordered this encounter  Medications   meloxicam (MOBIC) 15 MG tablet    Sig: Take 1 tablet (15 mg total) by mouth as needed.    Dispense:  90 tablet    Refill:  3     Return in about 6 months (around 12/08/2023) for CPE.     I discussed the assessment and treatment plan with the patient. The patient was provided an opportunity to ask questions and all were answered. The patient agreed with the plan and demonstrated an understanding of the instructions.   The patient was advised to call back or seek an in-person evaluation if the symptoms worsen or if the condition fails to improve as anticipated.   Shirlee Latch, MD [facility] [phone] (phone) [phone] (fax)  [facility]

## 2023-06-10 NOTE — Assessment & Plan Note (Signed)
 Using CPAP regularly, averaging closer to seven hours per night, with significant symptom improvement. No issues with the mask or device. Insurance compliance requirements are met.  - Document CPAP usage and effectiveness for insurance purposes - Continue current CPAP therapy

## 2023-06-10 NOTE — Assessment & Plan Note (Signed)
On Lipitor 40 mg daily. Last cholesterol check was good. Next check scheduled for August physical. - Continue Lipitor 40 mg daily - Check cholesterol at next physical in August

## 2023-06-10 NOTE — Assessment & Plan Note (Signed)
Last A1c in August 2024. Routine follow-up for A1c is due. Agrees to A1c and kidney function tests now and follow-up in August for physical exam. - Order A1c test - Order kidney function test

## 2023-06-11 DIAGNOSIS — G4733 Obstructive sleep apnea (adult) (pediatric): Secondary | ICD-10-CM | POA: Diagnosis not present

## 2023-06-13 DIAGNOSIS — G4733 Obstructive sleep apnea (adult) (pediatric): Secondary | ICD-10-CM | POA: Diagnosis not present

## 2023-06-19 DIAGNOSIS — R809 Proteinuria, unspecified: Secondary | ICD-10-CM | POA: Diagnosis not present

## 2023-06-19 DIAGNOSIS — E1129 Type 2 diabetes mellitus with other diabetic kidney complication: Secondary | ICD-10-CM | POA: Diagnosis not present

## 2023-06-20 ENCOUNTER — Encounter: Payer: Self-pay | Admitting: Family Medicine

## 2023-06-20 ENCOUNTER — Other Ambulatory Visit: Payer: Self-pay

## 2023-06-20 DIAGNOSIS — E1129 Type 2 diabetes mellitus with other diabetic kidney complication: Secondary | ICD-10-CM

## 2023-06-20 LAB — BASIC METABOLIC PANEL
BUN/Creatinine Ratio: 14 (ref 10–24)
BUN: 16 mg/dL (ref 8–27)
CO2: 25 mmol/L (ref 20–29)
Calcium: 9.9 mg/dL (ref 8.6–10.2)
Chloride: 102 mmol/L (ref 96–106)
Creatinine, Ser: 1.15 mg/dL (ref 0.76–1.27)
Glucose: 223 mg/dL — ABNORMAL HIGH (ref 70–99)
Potassium: 4.9 mmol/L (ref 3.5–5.2)
Sodium: 139 mmol/L (ref 134–144)
eGFR: 65 mL/min/{1.73_m2} (ref >59)

## 2023-06-20 LAB — HEMOGLOBIN A1C
Est. average glucose Bld gHb Est-mCnc: 209 mg/dL
Hgb A1c MFr Bld: 8.9 % — ABNORMAL HIGH (ref 4.8–5.6)

## 2023-06-20 MED ORDER — EMPAGLIFLOZIN 25 MG PO TABS: 25.0000 mg | ORAL_TABLET | Freq: Every day | ORAL | 1 refills | Status: DC

## 2023-06-24 ENCOUNTER — Encounter: Payer: Self-pay | Admitting: Family Medicine

## 2023-06-30 ENCOUNTER — Telehealth: Payer: Self-pay

## 2023-06-30 NOTE — Progress Notes (Signed)
 Care Guide Pharmacy Note  06/30/2023 Name: David [name]. MRN: [id number] DOB: 1946-04-01  Referred By: Erasmo Downer, MD Reason for referral: Care Coordination (Outreach to schedule with Pharm d )   David Berg. is a 78 y.o. year old male who is a primary care patient of Erasmo Downer, MD.  David Berg. was referred to the pharmacist for assistance related to: DMII  Successful contact was made with the patient to discuss pharmacy services including being ready for the pharmacist to call at least 5 minutes before the scheduled appointment time and to have medication bottles and any blood pressure readings ready for review. The patient agreed to meet with the pharmacist via telephone visit on (date/time).07/11/2023  Penne Lash , RMA     [place]  [facility], [facility] Guide  Direct Dial: [phone]  Website: [email or web address]

## 2023-07-01 ENCOUNTER — Ambulatory Visit (INDEPENDENT_AMBULATORY_CARE_PROVIDER_SITE_OTHER): Payer: Medicare Other

## 2023-07-01 DIAGNOSIS — Z0000 Encounter for general adult medical examination without abnormal findings: Secondary | ICD-10-CM | POA: Diagnosis not present

## 2023-07-01 NOTE — Progress Notes (Signed)
 Subjective:   David [name]. is a 78 y.o. male who presents for Medicare Annual/Subsequent preventive examination.  Visit Complete: Virtual I connected with  David [name]. on 07/01/23 by a audio enabled telemedicine application and verified that I am speaking with the correct person using two identifiers.  This patient declined Interactive audio and Acupuncturist. Therefore the visit was completed with audio only.   Patient Location: Home  Provider Location: Office/Clinic  I discussed the limitations of evaluation and management by telemedicine. The patient expressed understanding and agreed to proceed.  Vital Signs: Because this visit was a virtual/telehealth visit, some criteria may be missing or patient reported. Any vitals not documented were not able to be obtained and vitals that have been documented are patient reported.  Cardiac Risk Factors include: advanced age (>84men, >26 women);diabetes mellitus;dyslipidemia;microalbuminuria;obesity (BMI >30kg/m2);male gender     Objective:    There were no vitals filed for this visit. There is no height or weight on file to calculate BMI.     07/01/2023   11:02 AM 05/14/2023    7:57 AM 05/28/2022    9:53 AM 05/13/2022    6:17 AM 04/12/2022   12:58 PM 05/15/2021    4:00 PM 10/30/2020    1:22 PM  Advanced Directives  Does Patient Have a Medical Advance Directive? No Yes No No Yes No Yes  Type of Furniture conservator/restorer;Living will   Living will;Healthcare Power of Attorney    Would patient like information on creating a medical advance directive? No - Patient declined  No - Patient declined No - Patient declined  No - Patient declined     Current Medications (verified) Outpatient Encounter Medications as of 07/01/2023  Medication Sig   aspirin EC 81 MG tablet Take 1 tablet (81 mg total) by mouth daily. Swallow whole.   atorvastatin (LIPITOR) 40 MG tablet Take 1 tablet (40 mg total) by mouth  daily.   Blood Glucose Monitoring Suppl (ONE TOUCH ULTRA 2) w/Device KIT Use as directed   empagliflozin (JARDIANCE) 25 MG TABS tablet Take 1 tablet (25 mg total) by mouth daily before breakfast.   fluticasone (FLONASE) 50 MCG/ACT nasal spray Place 2 sprays into both nostrils daily.   glucose blood (ONETOUCH ULTRA TEST) test strip Use as instructed to test blood sugar once daily   lisinopril (ZESTRIL) 5 MG tablet Take 1 tablet (5 mg total) by mouth daily.   loratadine (CLARITIN) 10 MG tablet Take 1 tablet (10 mg total) by mouth daily.   meloxicam (MOBIC) 15 MG tablet Take 1 tablet (15 mg total) by mouth as needed.   metFORMIN (GLUCOPHAGE) 500 MG tablet Take 1 tablet (500 mg total) by mouth at bedtime.   Multiple Vitamin (MULTIVITAMIN ADULT PO) Take by mouth.   OneTouch Delica Lancets 33G MISC Use as directed   tamsulosin (FLOMAX) 0.4 MG CAPS capsule Take 1 capsule (0.4 mg total) by mouth daily.   nitroGLYCERIN (NITROSTAT) 0.4 MG SL tablet Place 1 tablet (0.4 mg total) under the tongue every 5 (five) minutes as needed for chest pain. You can repeat 3 time - after the 3rd time you will need to call 911   No facility-administered encounter medications on file as of 07/01/2023.    Allergies (verified) Alcohol   History: Past Medical History:  Diagnosis Date   Actinic keratosis 01/13/2017   R upper back post base of neck - biopsy proven    Adenomatous colon polyp  Allergy    Aortic atherosclerosis (HCC)    Atypical chest pain    BPH (benign prostatic hyperplasia)    CAD (coronary artery disease)    a.) CT chest 07/26/2021: 2v CAD; b.) cCTA 02/18/2022: CA score 159 (38th percentile for age/sex match); 25-49% pLAD and RCA distribution.   COPD (chronic obstructive pulmonary disease) (HCC)    Depression    Diastolic dysfunction 04/09/2022   a.) TTE 04/09/2022: EF 60-65%, mild LVH, AoV sclerosis, G1DD   Dysplastic nevus 12/23/2007   R med mid post thigh - slight atypia   Dysplastic nevus  12/22/2008   L mid ant thigh - slight atypia   Dysplastic nevus 05/10/2020   RUQA lat, severia atypia, excised 07/11/20   Hyperlipidemia    Hypertension    Melanoma (HCC) 1988   MM - clavicle, txt[facility] at [facility]   Microalbuminuria    OSA on CPAP    Pilonidal disease    Squamous cell carcinoma of skin 07/18/2020   nasal tip - Aypical Squamous Proliferation with Desmoplasia. Atypical squamous nests are concerning for invasive SCC. MOHs 09/11/2020   T2DM (type 2 diabetes mellitus) [facility])    Past Surgical History:  Procedure Laterality Date   broken right arm     in seventh grade   CHOLECYSTECTOMY  2001   COLONOSCOPY WITH PROPOFOL N/A 05/08/2018   Procedure: COLONOSCOPY WITH PROPOFOL;  Surgeon: Scot Jun, MD;  Location: [facility] ENDOSCOPY;  Service: Endoscopy;  Laterality: N/A;   COLONOSCOPY WITH PROPOFOL N/A 10/30/2020   Procedure: COLONOSCOPY WITH PROPOFOL;  Surgeon: Toledo, Boykin Nearing, MD;  Location: [facility] ENDOSCOPY;  Service: Gastroenterology;  Laterality: N/A;   COLONOSCOPY WITH PROPOFOL N/A 05/14/2023   Procedure: COLONOSCOPY WITH PROPOFOL;  Surgeon: Toledo, Boykin Nearing, MD;  Location: [facility] ENDOSCOPY;  Service: Gastroenterology;  Laterality: N/A;   ESOPHAGOGASTRODUODENOSCOPY (EGD) WITH PROPOFOL N/A 05/14/2023   Procedure: ESOPHAGOGASTRODUODENOSCOPY (EGD) WITH PROPOFOL;  Surgeon: Toledo, Boykin Nearing, MD;  Location: [facility] ENDOSCOPY;  Service: Gastroenterology;  Laterality: N/A;   MELANOMA EXCISION  1998   PILONIDAL CYST EXCISION N/A 05/13/2022   Procedure: CYST EXCISION PILONIDAL EXTENSIVE;  Surgeon: Campbell Lerner, MD;  Location: [facility] ORS;  Service: General;  Laterality: N/A;   TONSILLECTOMY AND ADENOIDECTOMY  1953   Family History  Problem Relation Age of Onset   Uterine cancer Mother 59   Colon cancer Mother 52   Bladder Cancer Mother 74   Colon polyps Brother    Cervical cancer Maternal Grandmother    Diabetes Maternal Grandmother    Tuberculosis Maternal Grandmother    Lung cancer  Maternal Grandfather    Leukemia Cousin    Brain cancer Cousin    Lung cancer Cousin    Social History   Socioeconomic History   Marital status: Married    Spouse name: Not on file   Number of children: 2   Years of education: College   Highest education level: Bachelor's degree (e.g., BA, AB, BS)  Occupational History    Employer: [employer]    Comment: retired  Tobacco Use   Smoking status: Every Day    Current packs/day: 1.00    Average packs/day: 1 pack/day for 52.0 years (52.0 ttl pk-yrs)    Types: Cigarettes    Passive exposure: Past   Smokeless tobacco: Never   Tobacco comments:    Pt started smoking again. Is smoking 1.25-1.5 PPD  Vaping Use   Vaping status: Never Used  Substance and Sexual Activity   Alcohol use: Not Currently  Comment: Previous Heavy Alcohol Abuse, sober   Drug use: Not Currently   Sexual activity: Not on file  Other Topics Concern   Not on file  Social History Narrative   Not on file   Social Drivers of Health   Financial Resource Strain: Low Risk  (07/01/2023)   Overall Financial Resource Strain (CARDIA)    Difficulty of Paying Living Expenses: Not hard at all  Food Insecurity: No Food Insecurity (07/01/2023)   Hunger Vital Sign    Worried About Running Out of Food in the Last Year: Never true    Ran Out of Food in the Last Year: Never true  Transportation Needs: No Transportation Needs (07/01/2023)   PRAPARE - Administrator, Civil Service (Medical): No    Lack of Transportation (Non-Medical): No  Physical Activity: Sufficiently Active (07/01/2023)   Exercise Vital Sign    Days of Exercise per Week: 4 days    Minutes of Exercise per Session: 60 min  Stress: No Stress Concern Present (07/01/2023)   [employer] of Occupational Health - Occupational Stress Questionnaire    Feeling of Stress : Only a little  Social Connections: Moderately Integrated (07/01/2023)   Social Connection and Isolation Panel [NHANES]    Frequency  of Communication with Friends and Family: More than three times a week    Frequency of Social Gatherings with Friends and Family: Three times a week    Attends Religious Services: More than 4 times per year    Active Member of Clubs or Organizations: Yes    Attends Banker Meetings: More than 4 times per year    Marital Status: Widowed    Tobacco Counseling Ready to quit: Not Answered Counseling given: Not Answered Tobacco comments: Pt started smoking again. Is smoking 1.25-1.5 PPD   Clinical Intake:  Pre-visit preparation completed: Yes  Pain : No/denies pain     BMI - recorded: 32 Nutritional Status: BMI > 30  Obese Nutritional Risks: None Diabetes: Yes CBG done?: No Did pt. bring in CBG monitor from home?: No  How often do you need to have someone help you when you read instructions, pamphlets, or other written materials from your doctor or pharmacy?: 1 - Never  Interpreter Needed?: No  Information entered by :: Kennedy Bucker, LPN   Activities of Daily Living    07/01/2023   11:03 AM 06/27/2023    7:39 PM  In your present state of health, do you have any difficulty performing the following activities:  Hearing? 0 0  Vision? 0 0  Difficulty concentrating or making decisions? 0 0  Walking or climbing stairs? 0 0  Dressing or bathing? 0 0  Doing errands, shopping? 0 0  Preparing Food and eating ? N N  Using the Toilet? N N  In the past six months, have you accidently leaked urine? N Y  Do you have problems with loss of bowel control? Y N  Managing your Medications? N N  Managing your Finances? N N  Housekeeping or managing your Housekeeping? N N    Patient Care Team: Erasmo Downer, MD as PCP - General (Family Medicine) Debbe Odea, MD as PCP - Cardiology (Cardiology) Galen Manila, MD as Referring Physician (Ophthalmology) Deirdre Evener, MD (Dermatology)  Indicate any recent Medical Services you may have received from  other than [facility] providers in the past year (date may be approximate).     Assessment:   This is a routine wellness examination for  David [name].  Hearing/Vision screen Hearing Screening - Comments:: NO AIDS Vision Screening - Comments:: NO GLASSES- DR.PORFILIO   Goals Addressed             This Visit's Progress    Cut out extra servings         Depression Screen    07/01/2023   11:01 AM 12/23/2022   10:31 AM 05/28/2022    9:50 AM 01/30/2022   11:50 AM 12/13/2021    2:58 PM 05/15/2021    3:54 PM 05/09/2020    9:54 AM  PHQ 2/9 Scores  PHQ - 2 Score 0 0 0 0 0 0 0  PHQ- 9 Score 0 1 0 0 0      Fall Risk    07/01/2023   11:03 AM 06/27/2023    7:39 PM 12/23/2022   10:29 AM 06/25/2022    9:54 AM 05/28/2022    9:54 AM  Fall Risk   Falls in the past year? 0 0 0 0 1  Number falls in past yr: 0 0 0  0  Injury with Fall? 0 0 0  0  Risk for fall due to : No Fall Risks  No Fall Risks  History of fall(s)  Follow up Falls prevention discussed;Falls evaluation completed  Falls evaluation completed  Falls evaluation completed;Falls prevention discussed    MEDICARE RISK AT HOME: Medicare Risk at Home Any stairs in or around the home?: No If so, are there any without handrails?: No Home free of loose throw rugs in walkways, pet beds, electrical cords, etc?: Yes Adequate lighting in your home to reduce risk of falls?: Yes Life alert?: No Use of a cane, walker or w/c?: No Grab bars in the bathroom?: Yes Shower chair or bench in shower?: Yes Elevated toilet seat or a handicapped toilet?: No  TIMED UP AND GO:  Was the test performed?  No    Cognitive Function:        07/01/2023   11:04 AM 05/28/2022   10:01 AM 04/15/2019    2:56 PM 01/21/2018    1:54 PM  6CIT Screen  What Year? 0 points 0 points 0 points 0 points  What month? 0 points 0 points 0 points 0 points  What time? 0 points 0 points 0 points 0 points  Count back from 20 0 points 0 points 0 points 0 points  Months in reverse 0  points 0 points 0 points 0 points  Repeat phrase 0 points 2 points 0 points 0 points  Total Score 0 points 2 points 0 points 0 points    Immunizations Immunization History  Administered Date(s) Administered   Fluad Quad(high Dose 65+) 02/02/2019, 02/23/2020   Fluad Trivalent(High Dose 65+) 01/21/2023   Influenza Split 02/27/2012   Influenza, High Dose Seasonal PF 02/19/2016, 01/15/2017, 01/21/2018, 02/15/2021   Influenza,inj,Quad PF,6+ Mos 02/18/2014   Influenza-Unspecified 03/07/2015   PFIZER(Purple Top)SARS-COV-2 Vaccination 05/28/2019, 06/18/2019, 02/11/2020   Pfizer Covid-19 Vaccine Bivalent Booster 15yrs & up 02/15/2021   Pneumococcal Conjugate-13 06/08/2014   Pneumococcal Polysaccharide-23 12/16/2011   Td 09/11/2004   Tetanus 10/04/2017   Zoster Recombinant(Shingrix) 08/28/2021, 12/31/2021   Zoster, Live 02/27/2012    TDAP status: Up to date  Flu Vaccine status: Up to date  Pneumococcal vaccine status: Declined,  Education has been provided regarding the importance of this vaccine but patient still declined. Advised may receive this vaccine at local pharmacy or Health Dept. Aware to provide a copy of the vaccination record if obtained from  local pharmacy or Health Dept. Verbalized acceptance and understanding.   Covid-19 vaccine status: Completed vaccines  Qualifies for Shingles Vaccine? Yes   Zostavax completed Yes   Shingrix Completed?: Yes  Screening Tests Health Maintenance  Topic Date Due   COVID-19 Vaccine (5 - 2024-25 season) 01/05/2023   OPHTHALMOLOGY EXAM  05/03/2023   Lung Cancer Screening  07/29/2023   HEMOGLOBIN A1C  12/17/2023   Diabetic kidney evaluation - Urine ACR  12/23/2023   FOOT EXAM  12/23/2023   Diabetic kidney evaluation - eGFR measurement  06/18/2024   Medicare Annual Wellness (AWV)  06/30/2024   Colonoscopy  05/13/2026   DTaP/Tdap/Td (3 - Tdap) 10/05/2027   Pneumonia Vaccine 90+ Years old  Completed   INFLUENZA VACCINE  Completed    Hepatitis C Screening  Completed   Zoster Vaccines- Shingrix  Completed   HPV VACCINES  Aged Out    Health Maintenance  Health Maintenance Due  Topic Date Due   COVID-19 Vaccine (5 - 2024-25 season) 01/05/2023   OPHTHALMOLOGY EXAM  05/03/2023   Lung Cancer Screening  07/29/2023    Colorectal cancer screening: Type of screening: Colonoscopy. Completed 05/14/23. Repeat every 3 years  Lung Cancer Screening: (Low Dose CT Chest recommended if Age 75-80 years, 20 pack-year currently smoking OR have quit w/in 15years.) does qualify.   Lung Cancer Screening Referral: CT 07/29/22  Additional Screening:  Hepatitis C Screening: does qualify; Completed 12/20/11   Vision Screening: Recommended annual ophthalmology exams for early detection of glaucoma and other disorders of the eye. Is the patient up to date with their annual eye exam?  Yes  Who is the provider or what is the name of the office in which the patient attends annual eye exams? DR.PORFILIO If pt is not established with a provider, would they like to be referred to a provider to establish care? No .   Dental Screening: Recommended annual dental exams for proper oral hygiene  Diabetic Foot Exam: Diabetic Foot Exam: Completed 12/23/22  Community Resource Referral / Chronic Care Management: CRR required this visit?  No   CCM required this visit?  No     Plan:     I have personally reviewed and noted the following in the patient's chart:   Medical and social history Use of alcohol, tobacco or illicit drugs  Current medications and supplements including opioid prescriptions. Patient is not currently taking opioid prescriptions. Functional ability and status Nutritional status Physical activity Advanced directives List of other physicians Hospitalizations, surgeries, and ER visits in previous 12 months Vitals Screenings to include cognitive, depression, and falls Referrals and appointments  In addition, I have reviewed  and discussed with patient certain preventive protocols, quality metrics, and best practice recommendations. A written personalized care plan for preventive services as well as general preventive health recommendations were provided to patient.     Hal Hope, LPN   9/60/4540   After Visit Summary: (MyChart) Due to this being a telephonic visit, the after visit summary with patients personalized plan was offered to patient via MyChart   Nurse Notes: NONE

## 2023-07-01 NOTE — Patient Instructions (Addendum)
 [name] , Thank you for taking time to come for your Medicare Wellness Visit. I appreciate your ongoing commitment to your health goals. Please review the following plan we discussed and let me know if I can assist you in the future.   Referrals/Orders/Follow-Ups/Clinician Recommendations: NONE  This is a list of the screening recommended for you and due dates:  Health Maintenance  Topic Date Due   COVID-19 Vaccine (5 - 2024-25 season) 01/05/2023   Eye exam for diabetics  05/03/2023   Screening for Lung Cancer  07/29/2023   Hemoglobin A1C  12/17/2023   Yearly kidney health urinalysis for diabetes  12/23/2023   Complete foot exam   12/23/2023   Yearly kidney function blood test for diabetes  06/18/2024   Medicare Annual Wellness Visit  06/30/2024   Colon Cancer Screening  05/13/2026   DTaP/Tdap/Td vaccine (3 - Tdap) 10/05/2027   Pneumonia Vaccine  Completed   Flu Shot  Completed   Hepatitis C Screening  Completed   Zoster (Shingles) Vaccine  Completed   HPV Vaccine  Aged Out    Advanced directives: (ACP Link)Information on Advanced Care Planning can be found at [facility] of [place] Advance Health Care Directives Advance Health Care Directives ([email or web address]   Next Medicare Annual Wellness Visit scheduled for next year: Yes   07/06/24 @ 10:10 AM BY PHONE

## 2023-07-09 DIAGNOSIS — G4733 Obstructive sleep apnea (adult) (pediatric): Secondary | ICD-10-CM | POA: Diagnosis not present

## 2023-07-11 ENCOUNTER — Other Ambulatory Visit: Payer: Self-pay | Admitting: Pharmacist

## 2023-07-11 ENCOUNTER — Telehealth: Payer: Self-pay | Admitting: Acute Care

## 2023-07-11 NOTE — Telephone Encounter (Signed)
 Patient called in and left VM to schedule his annual LDCT. Patient is no longer eligible for LCS as he is 78 yo. Called patient back and left a detailed message that he is no longer eligible for coverage of annual scan due to age. I advised on the VM for patient to call back for any questions or discussion of last results.

## 2023-07-11 NOTE — Progress Notes (Signed)
 07/11/2023 Name: David [name]. MRN: [id number] DOB: 1945/08/22  Chief Complaint  Patient presents with   Medication Management    David [name]. is a 78 y.o. year old male who presented for a telephone visit.   They were referred to the pharmacist by their PCP for assistance in managing diabetes and medication access.   Referred for Jardiance cost  Subjective:  Care Team: Primary Care Provider: Erasmo Downer, MD ; Next Scheduled Visit: 07/06/24 (no f/u scheduled yet) Clinical Pharmacist: Marlowe Aschoff, PharmD  Medication Access/Adherence  Current Pharmacy:  [id number] - [place], [place] - [address] AT [facility] [address] [facility] [address] [place] [address] Phone: [phone] Fax: [phone]   Patient reports affordability concerns with their medications: No  Patient reports access/transportation concerns to their pharmacy: No  Patient reports adherence concerns with their medications:  No     Diabetes:  Current medications: Jardiance 25mg  daily, Metformin IR 500mg  at bedtime Medications tried in the past: Metformin in AM (upset stomach due to only having Boost for breakfast)  Current glucose readings: Unknown   Patient denies hypoglycemic s/sx including dizziness, shakiness, sweating. Patient denies hyperglycemic symptoms including polyuria, polydipsia, polyphagia, nocturia, neuropathy, blurred vision.  Current meal patterns:  - Breakfast: Boost - Lunch: Boost or microwave meal - Supper: Cano of soup + V8 + Starbucks frappuccino drink - Drinks: V8, gatorade, Starbucks mocha frappuccino drink (3 per day)= 33 g of carbs each drink *Most recent issue is eating Reese's minatures- was doing 4-5/day but is now decreased to 1-2/day Does NOT cook  Current physical activity: Dog park, working at [employer]  Current medication access support: [employer] Works part-time at [employer] at [employer] on Wednesday to  Sunday   Objective:  Lab Results  Component Value Date   HGBA1C 8.9 (H) 06/19/2023    Lab Results  Component Value Date   CREATININE 1.15 06/19/2023   BUN 16 06/19/2023   NA 139 06/19/2023   K 4.9 06/19/2023   CL 102 06/19/2023   CO2 25 06/19/2023    Lab Results  Component Value Date   CHOL 129 12/23/2022   HDL 30 (L) 12/23/2022   LDLCALC 66 12/23/2022   TRIG 198 (H) 12/23/2022   CHOLHDL 4.3 12/23/2022    Medications Reviewed Today     Reviewed by Linna Darner, RPH (Pharmacist) on 07/11/23 at 1130  Med List Status: <None>   Medication Order Taking? Sig Documenting Provider Last Dose Status Informant  aspirin EC 81 MG tablet [id number] Yes Take 1 tablet (81 mg total) by mouth daily. Swallow whole. Debbe Odea, MD Taking Active   atorvastatin (LIPITOR) 40 MG tablet [id number] Yes Take 1 tablet (40 mg total) by mouth daily. Erasmo Downer, MD Taking Active   Blood Glucose Monitoring Suppl (ONE TOUCH ULTRA 2) w/Device Andria Rhein [id number] Yes Use as directed Beryle Flock Marzella Schlein, MD Taking Active   empagliflozin (JARDIANCE) 25 MG TABS tablet [id number] Yes Take 1 tablet (25 mg total) by mouth daily before breakfast. Erasmo Downer, MD Taking Active   fluticasone [facility]) 50 MCG/ACT nasal spray [id number] Yes Place 2 sprays into both nostrils daily. Erasmo Downer, MD Taking Active   glucose blood (ONETOUCH ULTRA TEST) test strip [id number] Yes Use as instructed to test blood sugar once daily Erasmo Downer, MD Taking Active   lisinopril (ZESTRIL) 5 MG tablet [id number] Yes Take 1 tablet (5 mg total) by  mouth daily. Erasmo Downer, MD Taking Active   loratadine (CLARITIN) 10 MG tablet [id number] Yes Take 1 tablet (10 mg total) by mouth daily. Erasmo Downer, MD Taking Active   meloxicam [facility]) 15 MG tablet [id number] Yes Take 1 tablet (15 mg total) by mouth as needed. Erasmo Downer, MD Taking Active   metFORMIN (GLUCOPHAGE) 500 MG tablet  [id number] Yes Take 1 tablet (500 mg total) by mouth at bedtime. Erasmo Downer, MD Taking Active   Multiple Vitamin (MULTIVITAMIN ADULT PO) [id number] Yes Take by mouth. [provider] Taking Active   nitroGLYCERIN (NITROSTAT) 0.4 MG SL tablet [id number]  Place 1 tablet (0.4 mg total) under the tongue every 5 (five) minutes as needed for chest pain. You can repeat 3 time - after the 3rd time you will need to call 911 Debbe Odea, MD  Expired 07/14/22 2359   OneTouch Delica Lancets 33G MISC [id number] Yes Use as directed Erasmo Downer, MD Taking Active   tamsulosin [facility]) 0.4 MG CAPS capsule [id number] Yes Take 1 capsule (0.4 mg total) by mouth daily. Erasmo Downer, MD Taking Active               Assessment/Plan:   Diabetes: - Currently uncontrolled - Reviewed long term cardiovascular and renal outcomes of uncontrolled blood sugar - Reviewed goal A1c, goal fasting, and goal 2 hour post prandial glucose - Recommend to check glucose daily before breakfast and write it down    Follow Up Plan:  - Follow-up on 07/24/23 for BG reading review - Discussed $375 drug deductible, so first fill was $420 but will be $45 each month afterwards for Jardiance  - Reports this is affordable - Will start tracking BG readings to see how medications need adjusted - Plans to keep his frappucino's and Reese's at this time- will have to work around diet *If needed for future, could consider Metformin XR and doing that at bedtime and increase to 1000mg    Ricka Burdock, PharmD [facility] Phone Number: [phone]

## 2023-07-17 DIAGNOSIS — H6981 Other specified disorders of Eustachian tube, right ear: Secondary | ICD-10-CM | POA: Diagnosis not present

## 2023-07-21 DIAGNOSIS — G4733 Obstructive sleep apnea (adult) (pediatric): Secondary | ICD-10-CM | POA: Diagnosis not present

## 2023-07-24 ENCOUNTER — Other Ambulatory Visit: Payer: Self-pay | Admitting: Pharmacist

## 2023-07-24 DIAGNOSIS — E1129 Type 2 diabetes mellitus with other diabetic kidney complication: Secondary | ICD-10-CM

## 2023-07-24 MED ORDER — METFORMIN HCL 1000 MG PO TABS: 1000.0000 mg | ORAL_TABLET | Freq: Every day | ORAL | 1 refills | Status: DC

## 2023-07-24 NOTE — Progress Notes (Signed)
 07/24/2023 Name: David [name]. MRN: [id number] DOB: [birth year]  Chief Complaint  Patient presents with   Diabetes    David [name]. is a 78 y.o. year old male who presented for a telephone visit.   They were referred to the pharmacist by their PCP for assistance in managing diabetes and medication access.   Referred for Jardiance cost  Subjective:  Care Team: Primary Care Provider: Erasmo Downer, MD ; Next Scheduled Visit: 07/06/24 (no f/u scheduled yet) Clinical Pharmacist: Marlowe Aschoff, PharmD  Medication Access/Adherence  Current Pharmacy:  [id number] - [place], [place] - [address] AT [facility] [address] [facility] [address] [place] [address] Phone: [phone] Fax: [phone]   Patient reports affordability concerns with their medications: No  Patient reports access/transportation concerns to their pharmacy: No  Patient reports adherence concerns with their medications:  No     Diabetes:  Current medications: Jardiance 25mg  daily, Metformin IR 500mg  at bedtime Medications tried in the past: Metformin in AM (upset stomach due to only having Boost for breakfast)  Current glucose readings from 07/24/23 visit: 154, 136, 149, 137, 150, 113, 130, 132, 124, 130, 142, 188 ([place] at night), 145   Patient denies hypoglycemic s/sx including dizziness, shakiness, sweating. Patient denies hyperglycemic symptoms including polyuria, polydipsia, polyphagia, nocturia, neuropathy, blurred vision.  Current meal patterns:  - Breakfast: Boost - Lunch: Boost or microwave meal - Supper: Cano of soup + V8 + Starbucks frappuccino drink - Drinks: V8, gatorade, Starbucks mocha frappuccino drink (3 per day)= 33 g of carbs each drink *Most recent issue is eating Reese's minatures- was doing 4-5 handfuls/day but is now decreased to 1-2/day Does NOT cook  Current physical activity: Dog park 2x per day, working at [employer]  Current  medication access support: [employer] Works part-time at [employer] at [employer] on Wednesday to Sunday   Objective:  Lab Results  Component Value Date   HGBA1C 8.9 (H) 06/19/2023    Lab Results  Component Value Date   CREATININE 1.15 06/19/2023   BUN 16 06/19/2023   NA 139 06/19/2023   K 4.9 06/19/2023   CL 102 06/19/2023   CO2 25 06/19/2023    Lab Results  Component Value Date   CHOL 129 12/23/2022   HDL 30 (L) 12/23/2022   LDLCALC 66 12/23/2022   TRIG 198 (H) 12/23/2022   CHOLHDL 4.3 12/23/2022    Medications Reviewed Today   Medications were not reviewed in this encounter       Assessment/Plan:   Diabetes: - Currently uncontrolled - Reviewed long term cardiovascular and renal outcomes of uncontrolled blood sugar - Reviewed goal A1c, goal fasting, and goal 2 hour post prandial glucose - Recommend to check glucose daily before breakfast and write it down   Follow Up Plan:  - Follow-up on 08/26/23 for BG reading review + metformin increase tolerance - Should be getting Jardiance for $45 monthly going forward - Tight on money as he just had to spend $1200 on a new fridge and dishwasher that broke after having since 2011 - Will continue to tracking fasting blood sugars - Plans to keep his frappucino's and Reese's at this time- will have to work around diet - INCREASE Metformin IR to 1000mg  at bedtime  - Saving XR formulation for if diarrhea or stomach upset occurs- will call office if concerns occur  - Will use up the 15 days of Metformin 500mg  first by  taking 2 at bedtime   Ricka Burdock, PharmD [facility] Phone Number: [phone]

## 2023-08-05 DIAGNOSIS — E119 Type 2 diabetes mellitus without complications: Secondary | ICD-10-CM | POA: Diagnosis not present

## 2023-08-09 DIAGNOSIS — G4733 Obstructive sleep apnea (adult) (pediatric): Secondary | ICD-10-CM | POA: Diagnosis not present

## 2023-08-13 ENCOUNTER — Encounter: Payer: Self-pay | Admitting: Family Medicine

## 2023-08-15 MED ORDER — SILDENAFIL CITRATE 100 MG PO TABS: 50.0000 mg | ORAL_TABLET | Freq: Every day | ORAL | 11 refills | Status: DC | PRN

## 2023-08-23 ENCOUNTER — Other Ambulatory Visit: Payer: Self-pay | Admitting: Family Medicine

## 2023-08-23 DIAGNOSIS — E1129 Type 2 diabetes mellitus with other diabetic kidney complication: Secondary | ICD-10-CM

## 2023-08-26 ENCOUNTER — Telehealth: Payer: Self-pay | Admitting: Pharmacist

## 2023-08-26 ENCOUNTER — Other Ambulatory Visit: Payer: Self-pay | Admitting: Pharmacist

## 2023-08-26 NOTE — Progress Notes (Signed)
   08/26/2023  Patient ID: David Baar., male   DOB: Jul 03, 1945, 78 y.o.   MRN: [id number]  Attempted to contact patient for scheduled appointment for medication management. Left HIPAA compliant message for patient to return my call at their convenience.    Will try again in 1 week.    Delvin File, PharmD [facility]  Phone Number: [phone]

## 2023-08-26 NOTE — Progress Notes (Signed)
   08/26/2023 Name: David [name]. MRN: [id number] DOB: 06-18-1945  Chief Complaint  Patient presents with   Medication Management    David [name]. is a 78 y.o. year old male who presented for a telephone visit.   They were referred to the pharmacist by their PCP for assistance in managing diabetes and medication access.   Referred for Jardiance  cost  Subjective:  Care Team: Primary Care Provider: Mazie Speed, MD ; Next Scheduled Visit: 07/06/24 (no f/u scheduled yet) Clinical Pharmacist: Kenneth Peace, PharmD  Medication Access/Adherence  Current Pharmacy:  [id number] - [place], [place] - [address] AT [facility] [address] [facility] [address] [place] [address] Phone: [phone] Fax: [phone]   Patient reports affordability concerns with their medications: No  Patient reports access/transportation concerns to their pharmacy: No  Patient reports adherence concerns with their medications:  No     Diabetes:  Current medications: Jardiance  25mg  daily, Metformin  IR 1000mg  at bedtime Medications tried in the past: Metformin  in AM (upset stomach due to only having Boost for breakfast)  Current glucose readings: 110-120's fasting per patient   Patient denies hypoglycemic s/sx including dizziness, shakiness, sweating. Patient denies hyperglycemic symptoms including polyuria, polydipsia, polyphagia, nocturia, neuropathy, blurred vision.  Current meal patterns:  - Breakfast: Boost - Lunch: Boost or microwave meal - Supper: Cano of soup + V8 + Starbucks frappuccino drink - Drinks: V8, gatorade, Starbucks mocha frappuccino drink (3 per day)= 33 g of carbs each drink *Most recent issue is eating Reese's minatures- was doing 4-5/day but is now decreased to 1-2/day Does NOT cook  Current physical activity: Dog park, working at [employer]  Current medication access support: [employer] Works part-time at [employer] at [employer]  on Wednesday to Sunday   Objective:  Lab Results  Component Value Date   HGBA1C 8.9 (H) 06/19/2023    Lab Results  Component Value Date   CREATININE 1.15 06/19/2023   BUN 16 06/19/2023   NA 139 06/19/2023   K 4.9 06/19/2023   CL 102 06/19/2023   CO2 25 06/19/2023    Lab Results  Component Value Date   CHOL 129 12/23/2022   HDL 30 (L) 12/23/2022   LDLCALC 66 12/23/2022   TRIG 198 (H) 12/23/2022   CHOLHDL 4.3 12/23/2022    Medications Reviewed Today   Medications were not reviewed in this encounter       Assessment/Plan:   Diabetes: - Currently uncontrolled - Reviewed long term cardiovascular and renal outcomes of uncontrolled blood sugar - Reviewed goal A1c, goal fasting, and goal 2 hour post prandial glucose - Recommend to check glucose daily before breakfast and write it down    Follow Up Plan:  - Follow-up on 07/24/23 for BG reading review - Reports doing well with Jardiance  and metformin  increase - Keep regimen and patient plans to schedule A1c re-check at end of June or mid-July to see how regimen reflects with A1c - Plans to keep his frappucino's and Reese's at this time- will have to work around diet *If needed for future, could consider Metformin  XR and doing that at bedtime and increase to 1000mg   David [name], PharmD [place]  Phone Number: [phone]

## 2023-08-28 ENCOUNTER — Telehealth: Payer: Self-pay | Admitting: Family Medicine

## 2023-08-28 ENCOUNTER — Other Ambulatory Visit: Payer: Self-pay

## 2023-08-28 DIAGNOSIS — R809 Proteinuria, unspecified: Secondary | ICD-10-CM

## 2023-08-28 MED ORDER — EMPAGLIFLOZIN 25 MG PO TABS: 25.0000 mg | ORAL_TABLET | Freq: Every day | ORAL | 1 refills | Status: AC

## 2023-08-28 NOTE — Telephone Encounter (Signed)
 [facility] is requesting refill JARDIANCE  25 MG TABS tablet  Please advise

## 2023-09-03 NOTE — Progress Notes (Signed)
 Cardiology Clinic Note   Date: 09/04/2023 ID: [name], DOB 1945-12-10, MRN [id number]  Primary Cardiologist:  Constancia Delton, MD  Chief Complaint   David Baar. is a 78 y.o. male who presents to the clinic today for routine follow up.   Patient Profile   David Santoli. is followed by Dr. Junnie Olives for the history outlined below.      Past medical history significant for: Nonobstructive CAD. Coronary CTA 02/18/2022: Coronary calcium  score 159 (38th percentile).  Mild proximal LAD and RCA stenosis. Echo 04/09/2022: EF 60 to 65%.  No RWMA.  Mild LVH.  Grade I DD.  Normal RV size/function.  Aortic valve sclerosis/calcification without stenosis. Hyperlipidemia. Lipid panel 12/23/2022: LDL 66, HDL 30, TG 198, total 129. OSA. 100% adherence with CPAP.  T2DM. COPD. Tobacco abuse.  In summary, patient was first evaluated by Dr. Junnie Olives on 02/11/2022 for a 4 to 84-month history of chest pain not associated with exertion.  Symptoms have been brief until a few days prior to visit when he had a 6-hour episode of dull discomfort in his substernal area.  He reported a normal exercise stress test approximately 20 years prior.  Coronary CTA and echo were performed as detailed above.  Patient was last seen in the office by Dr. Junnie Olives on 04/15/2022 for follow-up after testing.  Patient reported continued episodes of nonexertional chest discomfort.  He was instructed to continue aspirin , Lipitor, as needed SL NTG.  Patient did not want isosorbide.     History of Present Illness    Today, patient is doing well. Patient denies shortness of breath, dyspnea on exertion, lower extremity edema, orthopnea or PND. No palpitations. He reports continued occasional episodes of mild exertional chest discomfort lasting 15-60 minutes and resolving on its own. He works as a Nature conservation officer at [employer] and if he is rushing around he will notice the discomfort. He does not need to stop his  activity when it occurs and it does not get worse if he continues to exert himself. He has not taken NTG for this discomfort. He is active walking his dog at the dog park twice a day and working 4 nights a week at [employer]. He also enjoys camping 3 times a year. He is currently helping his fiance pack up her home. Home BP typically 110-120/60s.     ROS: All other systems reviewed and are otherwise negative except as noted in History of Present Illness.  EKGs/Labs Reviewed    EKG Interpretation Date/Time:  Thursday Sep 04 2023 10:15:30 EDT Ventricular Rate:  77 PR Interval:  184 QRS Duration:  98 QT Interval:  364 QTC Calculation: 411 R Axis:   -59  Text Interpretation: Normal sinus rhythm Left axis deviation Septal infarct , age undetermined Previous EKG 02/11/2022 (not in [place]) no significant change Confirmed by Morey Ar [phone]) on 09/04/2023 10:27:43 AM   01/09/2023: ALT 18; AST 16 06/19/2023: BUN 16; Creatinine, Ser 1.15; Potassium 4.9; Sodium 139   Physical Exam    VS:  BP (!) 100/40   Pulse 77   Ht 5\' 7"  (1.702 m)   Wt 202 lb (91.6 kg)   SpO2 94%   BMI 31.64 kg/m  , BMI Body mass index is 31.64 kg/m.  GEN: Well nourished, well developed, in no acute distress. Neck: No JVD or carotid bruits. Cardiac:  RRR. No murmurs. No rubs or gallops.   Respiratory:  Respirations regular and unlabored. Clear to auscultation without rales, wheezing  or rhonchi. GI: Soft, nontender, nondistended. Extremities: Radials/DP/PT 2+ and equal bilaterally. No clubbing or cyanosis. No edema.  Skin: Warm and dry, no rash. Neuro: Strength intact.  Assessment & Plan   Nonobstructive CAD Coronary CTA October 2023 demonstrated calcium  score of 159 with mild proximal LAD and RCA stenosis.  Echo showed normal LV/RV function, mild LVH, Grade I DD, aortic valve sclerosis/calcification without stenosis.  Patient reports continued occasional episodes of mild exertional chest discomfort lasting 15-60  minutes and resolving on its own. He has not taken NTG. Discomfort does not stop him for being active. Other than working as a Nature conservation officer 4 nights a week he walks his dog at the dog park twice a day and camps three times a year. He is currently helping his fiance pack up her house with no limitations.  - No further testing indicated at this time.  - Patient is instructed to contact the office if pain becomes more persistent or increases in intensity.  - Continue aspirin , atorvastatin , as needed SL NTG.  Hyperlipidemia LDL 60 10 December 2022, at goal. - Continue atorvastatin .  OSA Patient reports 100% compliance with CPAP.  - Encouraged continued use of CPAP.   Tobacco abuse Patient smokes 1.5 packs of cigarettes a day. He is not interested in quitting.  - Cessation recommended.   Disposition: Return in 1 year or sooner as needed.          Signed, Lonell Rives. Deriona Altemose, DNP, NP-C

## 2023-09-04 ENCOUNTER — Encounter: Payer: Self-pay | Admitting: Student

## 2023-09-04 ENCOUNTER — Ambulatory Visit: Attending: Student | Admitting: Student

## 2023-09-04 VITALS — BP 100/40 | HR 77 | Ht 67.0 in | Wt 202.0 lb

## 2023-09-04 DIAGNOSIS — E785 Hyperlipidemia, unspecified: Secondary | ICD-10-CM | POA: Diagnosis not present

## 2023-09-04 DIAGNOSIS — G4733 Obstructive sleep apnea (adult) (pediatric): Secondary | ICD-10-CM | POA: Diagnosis not present

## 2023-09-04 DIAGNOSIS — I2510 Atherosclerotic heart disease of native coronary artery without angina pectoris: Secondary | ICD-10-CM

## 2023-09-04 DIAGNOSIS — E119 Type 2 diabetes mellitus without complications: Secondary | ICD-10-CM | POA: Diagnosis not present

## 2023-09-04 DIAGNOSIS — Z720 Tobacco use: Secondary | ICD-10-CM

## 2023-09-04 MED ORDER — NITROGLYCERIN 0.4 MG SL SUBL: 0.4000 mg | SUBLINGUAL_TABLET | SUBLINGUAL | 3 refills | Status: AC | PRN

## 2023-09-04 NOTE — Patient Instructions (Signed)
 Medication Instructions:  Your Physician recommend you continue on your current medication as directed.    *If you need a refill on your cardiac medications before your next appointment, please call your pharmacy*  Lab Work: None ordered at this time   Follow-Up: At [facility], you and your health needs are our priority.  As part of our continuing mission to provide you with exceptional heart care, our providers are all part of one team.  This team includes your primary Cardiologist (physician) and Advanced Practice Providers or APPs (Physician Assistants and Nurse Practitioners) who all work together to provide you with the care you need, when you need it.  Your next appointment:   1 year(s)  Provider:   You may see Debbe Odea, MD or Carlos Levering, NP

## 2023-09-08 DIAGNOSIS — G4733 Obstructive sleep apnea (adult) (pediatric): Secondary | ICD-10-CM | POA: Diagnosis not present

## 2023-09-16 DIAGNOSIS — L03031 Cellulitis of right toe: Secondary | ICD-10-CM | POA: Diagnosis not present

## 2023-09-16 DIAGNOSIS — K080 Exfoliation of teeth due to systemic causes: Secondary | ICD-10-CM | POA: Diagnosis not present

## 2023-09-16 DIAGNOSIS — L03032 Cellulitis of left toe: Secondary | ICD-10-CM | POA: Diagnosis not present

## 2023-09-16 DIAGNOSIS — E119 Type 2 diabetes mellitus without complications: Secondary | ICD-10-CM | POA: Diagnosis not present

## 2023-10-02 DIAGNOSIS — E119 Type 2 diabetes mellitus without complications: Secondary | ICD-10-CM | POA: Diagnosis not present

## 2023-10-02 DIAGNOSIS — L03032 Cellulitis of left toe: Secondary | ICD-10-CM | POA: Diagnosis not present

## 2023-10-02 DIAGNOSIS — L03031 Cellulitis of right toe: Secondary | ICD-10-CM | POA: Diagnosis not present

## 2023-10-09 DIAGNOSIS — G4733 Obstructive sleep apnea (adult) (pediatric): Secondary | ICD-10-CM | POA: Diagnosis not present

## 2023-10-16 DIAGNOSIS — L03032 Cellulitis of left toe: Secondary | ICD-10-CM | POA: Diagnosis not present

## 2023-10-16 DIAGNOSIS — L03031 Cellulitis of right toe: Secondary | ICD-10-CM | POA: Diagnosis not present

## 2023-10-16 DIAGNOSIS — E119 Type 2 diabetes mellitus without complications: Secondary | ICD-10-CM | POA: Diagnosis not present

## 2023-11-04 DIAGNOSIS — E119 Type 2 diabetes mellitus without complications: Secondary | ICD-10-CM | POA: Diagnosis not present

## 2023-11-08 DIAGNOSIS — G4733 Obstructive sleep apnea (adult) (pediatric): Secondary | ICD-10-CM | POA: Diagnosis not present

## 2023-11-20 ENCOUNTER — Telehealth: Payer: Self-pay | Admitting: Pharmacist

## 2023-11-20 NOTE — Progress Notes (Signed)
   11/20/2023  Patient ID: David [name]., male   DOB: [birth year], 78 y.o.   MRN: [id number]  Most recent A1c came back at 6.5% on 09/16/23. No more follow-ups needed as goal of referral was met.   Aloysius Lewis, PharmD [facility]  Phone Number: [phone]

## 2023-11-24 ENCOUNTER — Encounter: Payer: Self-pay | Admitting: Family Medicine

## 2023-11-28 MED ORDER — TADALAFIL 20 MG PO TABS: 10.0000 mg | ORAL_TABLET | ORAL | 11 refills | Status: AC | PRN

## 2023-12-05 DIAGNOSIS — E119 Type 2 diabetes mellitus without complications: Secondary | ICD-10-CM | POA: Diagnosis not present

## 2023-12-09 DIAGNOSIS — G4733 Obstructive sleep apnea (adult) (pediatric): Secondary | ICD-10-CM | POA: Diagnosis not present

## 2023-12-19 DIAGNOSIS — G4733 Obstructive sleep apnea (adult) (pediatric): Secondary | ICD-10-CM | POA: Diagnosis not present

## 2024-01-01 DIAGNOSIS — D485 Neoplasm of uncertain behavior of skin: Secondary | ICD-10-CM | POA: Diagnosis not present

## 2024-01-01 DIAGNOSIS — L578 Other skin changes due to chronic exposure to nonionizing radiation: Secondary | ICD-10-CM | POA: Diagnosis not present

## 2024-01-01 DIAGNOSIS — C44729 Squamous cell carcinoma of skin of left lower limb, including hip: Secondary | ICD-10-CM | POA: Diagnosis not present

## 2024-01-01 DIAGNOSIS — Z08 Encounter for follow-up examination after completed treatment for malignant neoplasm: Secondary | ICD-10-CM | POA: Diagnosis not present

## 2024-01-01 DIAGNOSIS — Z85820 Personal history of malignant melanoma of skin: Secondary | ICD-10-CM | POA: Diagnosis not present

## 2024-01-01 DIAGNOSIS — Z7189 Other specified counseling: Secondary | ICD-10-CM | POA: Diagnosis not present

## 2024-01-05 DIAGNOSIS — E119 Type 2 diabetes mellitus without complications: Secondary | ICD-10-CM | POA: Diagnosis not present

## 2024-01-21 ENCOUNTER — Ambulatory Visit

## 2024-01-21 DIAGNOSIS — Z1283 Encounter for screening for malignant neoplasm of skin: Secondary | ICD-10-CM

## 2024-01-21 DIAGNOSIS — L578 Other skin changes due to chronic exposure to nonionizing radiation: Secondary | ICD-10-CM

## 2024-01-21 DIAGNOSIS — D1801 Hemangioma of skin and subcutaneous tissue: Secondary | ICD-10-CM

## 2024-01-21 DIAGNOSIS — C4490 Unspecified malignant neoplasm of skin, unspecified: Secondary | ICD-10-CM

## 2024-01-21 DIAGNOSIS — W908XXA Exposure to other nonionizing radiation, initial encounter: Secondary | ICD-10-CM

## 2024-01-21 DIAGNOSIS — L821 Other seborrheic keratosis: Secondary | ICD-10-CM

## 2024-01-21 DIAGNOSIS — D229 Melanocytic nevi, unspecified: Secondary | ICD-10-CM

## 2024-01-21 DIAGNOSIS — L814 Other melanin hyperpigmentation: Secondary | ICD-10-CM | POA: Diagnosis not present

## 2024-01-21 DIAGNOSIS — Z85820 Personal history of malignant melanoma of skin: Secondary | ICD-10-CM

## 2024-01-21 NOTE — Patient Instructions (Signed)

## 2024-01-21 NOTE — Progress Notes (Signed)
 Subjective   David [name]. is a 78 y.o. male who presents for the following: Total body skin exam for skin cancer screening and mole check. The patient has spots, moles and lesions to be evaluated, some may be new or changing and the patient may have concern these could be cancer.. Patient is new patient  Today patient reports: Patient noticed a spot at ankle in August. Went to [facility] in [facility] and had a biopsy done, patient has not gotten results yet. Would like to have an all over check due to hx MM, DN and SCC.   Review of Systems:    History of melanoma ROS - denies any fevers, chills, weight loss, night sweats, lymphadenopathy, nausea, vomiting, diarrhea, chest pain, shortness of breath, headaches, changes in vision  .  The following portions of the chart were reviewed this encounter and updated as appropriate: medications, allergies, medical history  Relevant Medical History:  Personal history of melanoma - see medical history for full details   Objective  Well appearing patient in no apparent distress; mood and affect are within normal limits. Examination was performed of the: Full Skin Examination: scalp, head, eyes, ears, nose, lips, neck, chest, axillae, abdomen, back, buttocks, bilateral upper extremities, bilateral lower extremities, hands, feet, fingers, toes, fingernails, and toenails.   Examination notable for: Angioma(s): Scattered red vascular papule(s)  , Lentigo/lentigines: Scattered pigmented macules that are tan to brown in color and are somewhat non-uniform in shape and concentrated in the sun-exposed areas, Nevus/nevi: Scattered well-demarcated, regular, pigmented macule(s) and/or papule(s)  , Seborrheic Keratosis(es): Stuck-on appearing keratotic papule(s) on the trunk, none  irritated with redness, crusting, edema, and/or partial avulsion, Actinic Damage/Elastosis: chronic sun damage: dyspigmentation, telangiectasia, and wrinkling  Well  healing bx site at left medial lower extremity  Examination limited by: Undergarments and Patient deferred removal       Assessment & Plan   SKIN CANCER SCREENING PERFORMED TODAY.  BENIGN SKIN FINDINGS  - Lentigines  - Seborrheic keratoses  - Hemangiomas   - Nevus/Multiple Benign Nevi - Reassurance provided regarding the benign appearance of lesions noted on exam today; no treatment is indicated in the absence of symptoms/changes. - Reinforced importance of photoprotective strategies including liberal and frequent sunscreen use of a broad-spectrum SPF 30 or greater, use of protective clothing, and sun avoidance for prevention of cutaneous malignancy and photoaging.  Counseled patient on the importance of regular self-skin monitoring as well as routine clinical skin examinations as scheduled.   ACTINIC DAMAGE - Chronic condition, secondary to cumulative UV/sun exposure - Recommend daily broad spectrum sunscreen SPF 30+ to sun-exposed areas, reapply every 2 hours as needed.  - Staying in the shade or wearing long sleeves, sun glasses (UVA+UVB protection) and wide brim hats (4-inch brim around the entire circumference of the hat) are also recommended for sun protection.  - Call for new or changing lesions.  Personal history of non melanoma skin cancer  and melanoma - Reviewed medical history for full details  - Reviewed sun protective measures as above - Encouraged full body skin exams     Healing biopsy of L medial lower extremity - lesion biopsied 8/28 by [facility] d/t c/f [facility] - results are pending. Encouraged to follow up with them for results.  - Advised to let us  know biopsy results and we can assist with tx if needed - discussed mohs vs ed&C vs efudex pending results    Procedures, orders, diagnosis  for this visit:  SKIN EXAM FOR MALIGNANT NEOPLASM   LENTIGO   SEBORRHEIC KERATOSIS   MULTIPLE BENIGN NEVI   CHERRY ANGIOMA   NON-MELANOMA  SKIN CANCER   HISTORY OF MELANOMA   ACTINIC SKIN DAMAGE    Skin exam for malignant neoplasm  Lentigo  Seborrheic keratosis  Multiple benign nevi  Cherry angioma  Non-melanoma skin cancer  History of melanoma  Actinic skin damage    Return to clinic: Return in about 6 months (around 07/20/2024) for TBSE, HxMM, HxDN, HxSCC, with Dr. Raymund.  LILLETTE Lonell Drones, RMA, am acting as scribe for Lauraine JAYSON Raymund, MD .   Documentation: I have reviewed the above documentation for accuracy and completeness, and I agree with the above.  Lauraine JAYSON Raymund, MD

## 2024-02-04 DIAGNOSIS — E119 Type 2 diabetes mellitus without complications: Secondary | ICD-10-CM | POA: Diagnosis not present

## 2024-02-04 DIAGNOSIS — C44729 Squamous cell carcinoma of skin of left lower limb, including hip: Secondary | ICD-10-CM | POA: Diagnosis not present

## 2024-02-17 DIAGNOSIS — K080 Exfoliation of teeth due to systemic causes: Secondary | ICD-10-CM | POA: Diagnosis not present

## 2024-02-20 NOTE — Progress Notes (Signed)
 [employer].                                          MRN: [id number]   02/20/2024   The VBCI Quality Team Specialist reviewed this patient medical record for the purposes of chart review for care gap closure. The following were reviewed: chart review for care gap closure-kidney health evaluation for diabetes:eGFR  and uACR.    VBCI Quality Team

## 2024-02-26 ENCOUNTER — Other Ambulatory Visit: Payer: Self-pay | Admitting: Family Medicine

## 2024-02-26 DIAGNOSIS — E1129 Type 2 diabetes mellitus with other diabetic kidney complication: Secondary | ICD-10-CM

## 2024-02-28 NOTE — Telephone Encounter (Signed)
 OFFICE VISIT NEEDED FOR ADDITIONAL REFILLS   Requested Prescriptions  Pending Prescriptions Disp Refills   metFORMIN  (GLUCOPHAGE ) 1000 MG tablet [Pharmacy Med Name: METFORMIN  1000MG  TABLETS] 30 tablet 0    Sig: TAKE 1 TABLET(1000 MG) BY MOUTH AT BEDTIME     Endocrinology:  Diabetes - Biguanides Failed - 02/28/2024  7:32 AM      Failed - HBA1C is between 0 and 7.9 and within 180 days    Hgb A1c MFr Bld  Date Value Ref Range Status  06/19/2023 8.9 (H) 4.8 - 5.6 % Final    Comment:             Prediabetes: 5.7 - 6.4          Diabetes: >6.4          Glycemic control for adults with diabetes: <7.0          Failed - B12 Level in normal range and within 720 days    No results found for: VITAMINB12       Failed - Valid encounter within last 6 months    Recent Outpatient Visits           8 months ago OSA (obstructive sleep apnea)   [place] [facility] [place], Jon HERO, MD       Future Appointments             In 4 days Raymund, Lauraine BROCKS, MD [facility]   In 4 months Raymund, Lauraine BROCKS, MD [facility]            Failed - CBC within normal limits and completed in the last 12 months    WBC  Date Value Ref Range Status  04/15/2022 9.2 4.0 - 10.5 K/uL Final   RBC  Date Value Ref Range Status  04/15/2022 4.54 4.22 - 5.81 MIL/uL Final   Hemoglobin  Date Value Ref Range Status  04/15/2022 14.8 13.0 - 17.0 g/dL Final  91/89/7976 83.7 13.0 - 17.7 g/dL Final   HCT  Date Value Ref Range Status  04/15/2022 42.1 39.0 - 52.0 % Final   Hematocrit  Date Value Ref Range Status  12/13/2021 47.6 37.5 - 51.0 % Final   MCHC  Date Value Ref Range Status  04/15/2022 35.2 30.0 - 36.0 g/dL Final   [facility]  Date Value Ref Range Status  04/15/2022 32.6 26.0 - 34.0 pg Final   MCV  Date Value Ref Range Status  04/15/2022 92.7 80.0 - 100.0 fL Final  12/13/2021 96 79 - 97 fL Final  04/01/2012 94 80 - 100 fL Final   No  results found for: PLTCOUNTKUC, LABPLAT, POCPLA RDW  Date Value Ref Range Status  04/15/2022 12.0 11.5 - 15.5 % Final  12/13/2021 11.8 11.6 - 15.4 % Final  04/01/2012 13.3 11.5 - 14.5 % Final         Passed - Cr in normal range and within 360 days    Creatinine  Date Value Ref Range Status  04/01/2012 0.81 0.60 - 1.30 mg/dL Final   Creatinine, Ser  Date Value Ref Range Status  06/19/2023 1.15 0.76 - 1.27 mg/dL Final         Passed - eGFR in normal range and within 360 days    EGFR (African American)  Date Value Ref Range Status  04/01/2012 >60  Final   GFR calc Af Amer  Date Value Ref Range Status  05/09/2020 83 >59 mL/min/1.73 Final  Comment:    **In accordance with recommendations from the NKF-ASN Task force,**   [facility] is in the process of updating its eGFR calculation to the   2021 CKD-EPI creatinine equation that estimates kidney function   without a race variable.    EGFR (Non-African Amer.)  Date Value Ref Range Status  04/01/2012 >60  Final    Comment:    eGFR values <75mL/min/1.73 m2 may be an indication of chronic kidney disease (CKD). Calculated eGFR is useful in patients with stable renal function. The eGFR calculation will not be reliable in acutely ill patients when serum creatinine is changing rapidly. It is not useful in  patients on dialysis. The eGFR calculation may not be applicable to patients at the low and high extremes of body sizes, pregnant women, and vegetarians.    GFR, Estimated  Date Value Ref Range Status  04/15/2022 >60 >60 mL/min Final    Comment:    (NOTE) Calculated using the CKD-EPI Creatinine Equation (2021)    eGFR  Date Value Ref Range Status  06/19/2023 65 >59 mL/min/1.73 Final

## 2024-03-01 ENCOUNTER — Telehealth: Payer: Self-pay | Admitting: Family Medicine

## 2024-03-01 NOTE — Telephone Encounter (Signed)
 [facility] fax[facility] refill request for the following medications:  metFORMIN  (GLUCOPHAGE ) 1000 MG tablet (90 day supply)   Please advise.

## 2024-03-01 NOTE — Telephone Encounter (Signed)
 Disp Refills Start End   metFORMIN  (GLUCOPHAGE ) 1000 MG tablet 30 tablet 0 02/28/2024 --   Sig: TAKE 1 TABLET(1000 MG) BY MOUTH AT BEDTIME   Sent to pharmacy as: metFORMIN  (GLUCOPHAGE ) 1000 MG tablet   E-Prescribing Status: Receipt confirmed by pharmacy (02/28/2024  7:32 AM EDT

## 2024-03-03 ENCOUNTER — Encounter: Payer: Self-pay | Admitting: Family Medicine

## 2024-03-03 ENCOUNTER — Ambulatory Visit

## 2024-03-03 ENCOUNTER — Other Ambulatory Visit: Payer: Self-pay

## 2024-03-03 DIAGNOSIS — D1801 Hemangioma of skin and subcutaneous tissue: Secondary | ICD-10-CM

## 2024-03-03 DIAGNOSIS — E1129 Type 2 diabetes mellitus with other diabetic kidney complication: Secondary | ICD-10-CM

## 2024-03-03 DIAGNOSIS — L820 Inflamed seborrheic keratosis: Secondary | ICD-10-CM | POA: Diagnosis not present

## 2024-03-03 DIAGNOSIS — L821 Other seborrheic keratosis: Secondary | ICD-10-CM

## 2024-03-03 DIAGNOSIS — W908XXA Exposure to other nonionizing radiation, initial encounter: Secondary | ICD-10-CM

## 2024-03-03 DIAGNOSIS — L814 Other melanin hyperpigmentation: Secondary | ICD-10-CM | POA: Diagnosis not present

## 2024-03-03 DIAGNOSIS — L578 Other skin changes due to chronic exposure to nonionizing radiation: Secondary | ICD-10-CM

## 2024-03-03 DIAGNOSIS — D229 Melanocytic nevi, unspecified: Secondary | ICD-10-CM

## 2024-03-03 DIAGNOSIS — Z85828 Personal history of other malignant neoplasm of skin: Secondary | ICD-10-CM

## 2024-03-03 MED ORDER — METFORMIN HCL 1000 MG PO TABS: 1000.0000 mg | ORAL_TABLET | Freq: Two times a day (BID) | ORAL | 2 refills | Status: AC

## 2024-03-03 NOTE — Patient Instructions (Addendum)

## 2024-03-03 NOTE — Progress Notes (Signed)
 Subjective   David [name]. is a 78 y.o. male who presents for the following: Lesion(s) of concern . Patient is established patient   Today patient reports: Area of concern on the chest.  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   Review of Systems:    History of melanoma ROS - denies any fevers, chills, weight loss, night sweats, lymphadenopathy, nausea, vomiting, diarrhea, chest pain, shortness of breath, headaches, changes in vision  .  The following portions of the chart were reviewed this encounter and updated as appropriate: medications, allergies, medical history  Relevant Medical History:  Personal history of non melanoma skin cancer - see medical history for full details  and Personal history of melanoma - see medical history for full details   Objective  Well appearing patient in no apparent distress; mood and affect are within normal limits. Examination was performed of the: Focused Exam of: Chest, abdomen, bilateral lower extremities   Examination notable for: Angioma(s): Scattered red vascular papule(s)  , Lentigo/lentigines: Scattered pigmented macules that are tan to brown in color and are somewhat non-uniform in shape and concentrated in the sun-exposed areas, Nevus/nevi: Scattered well-demarcated, regular, pigmented macule(s) and/or papule(s)  , Seborrheic Keratosis(es): Stuck-on appearing keratotic papule(s) on the trunk, some  irritated with redness, crusting, edema, and/or partial avulsion, Actinic Damage/Elastosis: chronic sun damage: dyspigmentation, telangiectasia, and wrinkling  L medial lower extremity w crusted plaque  Examination limited by: Clothing and Patient deferred removal     Chest (2) Stuck on waxy paps with erythema  Assessment & Plan   BENIGN SKIN FINDINGS  - Lentigines  - Seborrheic keratoses  - Hemangiomas   - Nevus/Multiple Benign Nevi  - Reassurance provided regarding  the benign appearance of lesions noted on exam today; no treatment is indicated in the absence of symptoms/changes. - Reinforced importance of photoprotective strategies including liberal and frequent sunscreen use of a broad-spectrum SPF 30 or greater, use of protective clothing, and sun avoidance for prevention of cutaneous malignancy and photoaging.  Counseled patient on the importance of regular self-skin monitoring as well as routine clinical skin examinations as scheduled.   ACTINIC DAMAGE - Chronic condition, secondary to cumulative UV/sun exposure - Recommend daily broad spectrum sunscreen SPF 30+ to sun-exposed areas, reapply every 2 hours as needed.  - Staying in the shade or wearing long sleeves, sun glasses (UVA+UVB protection) and wide brim hats (4-inch brim around the entire circumference of the hat) are also recommended for sun protection.  - Call for new or changing lesions.  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 Encouraged daily vaseline/bandaid. Seeing other dermatologist for re eval in a few weeks. Discussed if persistently scabbed/hyperkeratotic may need re biopsy vs re treatment in the future   - Reviewed medical history for full details  - Reviewed sun protective measures as above - Encouraged full body skin exams     Level of service outlined above   Procedures, orders, diagnosis for this visit:  INFLAMED SEBORRHEIC KERATOSIS (2) Chest (2) Symptomatic, irritating, patient would like treated. Destruction of lesion - Chest (2) Complexity: simple   Destruction method: cryotherapy   Informed consent: discussed and consent obtained   Timeout:  patient name, date of birth, surgical site, and procedure verified Lesion destroyed using liquid nitrogen:  Yes   Region frozen until ice ball extended beyond lesion: Yes   Cryo cycles: 1 or 2. Outcome: patient  tolerated procedure well with no complications   Post-procedure details: wound care instructions given     Inflamed seborrheic keratosis -     Destruction of lesion    Return to clinic: Return if symptoms worsen or fail to improve.  I, Emerick Ege, CMA am acting as scribe for Lauraine JAYSON Kanaris, MD.   Documentation: I have reviewed the above documentation for accuracy and completeness, and I agree with the above.  Lauraine JAYSON Kanaris, MD

## 2024-03-06 DIAGNOSIS — E119 Type 2 diabetes mellitus without complications: Secondary | ICD-10-CM | POA: Diagnosis not present

## 2024-03-10 DIAGNOSIS — G4733 Obstructive sleep apnea (adult) (pediatric): Secondary | ICD-10-CM | POA: Diagnosis not present

## 2024-03-31 DIAGNOSIS — C44729 Squamous cell carcinoma of skin of left lower limb, including hip: Secondary | ICD-10-CM | POA: Diagnosis not present

## 2024-03-31 DIAGNOSIS — Z85820 Personal history of malignant melanoma of skin: Secondary | ICD-10-CM | POA: Diagnosis not present

## 2024-03-31 DIAGNOSIS — Z08 Encounter for follow-up examination after completed treatment for malignant neoplasm: Secondary | ICD-10-CM | POA: Diagnosis not present

## 2024-04-20 ENCOUNTER — Other Ambulatory Visit: Payer: Self-pay | Admitting: Family Medicine

## 2024-04-20 ENCOUNTER — Telehealth: Payer: Self-pay | Admitting: Family Medicine

## 2024-04-20 NOTE — Telephone Encounter (Addendum)
 [facility] faxed refill request for the following medications:  tamsulosin  (FLOMAX ) 0.4 MG CAPS capsule   atorvastatin  (LIPITOR) 40 MG tablet     Please advise.

## 2024-05-03 ENCOUNTER — Other Ambulatory Visit: Payer: Self-pay

## 2024-05-03 ENCOUNTER — Encounter: Payer: Self-pay | Admitting: Family Medicine

## 2024-05-03 MED ORDER — TAMSULOSIN HCL 0.4 MG PO CAPS: 0.4000 mg | ORAL_CAPSULE | Freq: Every day | ORAL | 0 refills | Status: AC

## 2024-07-06 ENCOUNTER — Ambulatory Visit: Payer: Medicare Other

## 2024-07-21 ENCOUNTER — Ambulatory Visit

## 2024-08-04 ENCOUNTER — Ambulatory Visit
